# Patient Record
Sex: Female | Born: 1965 | Race: Black or African American | Hispanic: No | Smoking: Never smoker
Health system: Southern US, Community
[De-identification: ages and names within clinical notes are randomized; demographics above are authoritative.]

## PROBLEM LIST (undated history)

## (undated) DIAGNOSIS — N200 Calculus of kidney: Secondary | ICD-10-CM

## (undated) DIAGNOSIS — I509 Heart failure, unspecified: Secondary | ICD-10-CM

## (undated) DIAGNOSIS — I10 Essential (primary) hypertension: Secondary | ICD-10-CM

## (undated) DIAGNOSIS — E119 Type 2 diabetes mellitus without complications: Secondary | ICD-10-CM

## (undated) DIAGNOSIS — Z8619 Personal history of other infectious and parasitic diseases: Secondary | ICD-10-CM

## (undated) HISTORY — DX: Personal history of other infectious and parasitic diseases: Z86.19

## (undated) HISTORY — DX: Calculus of kidney: N20.0

## (undated) HISTORY — PX: ANKLE SURGERY: SHX546

## (undated) NOTE — *Deleted (*Deleted)
***In Progress*** Primary Care Provider: Ardith Dark, MD [facility] Cardiologist: Reatha Harps, MD [facility] Electrophysiologist:  Lanier Prude, MD  Advanced HF Cardiologist: Dr. Gala Romney  HPI:  Savannah Morales is a 17 y.o. female with a hx of DM, HTN, and new onset systolic HF.  On 12/24/19, she was admitted with worsening SOB and cough. She said she was doing fine until she got the COVID vaccine Proofreader) in May 2021. Echo showed EF 35%. She was diuresed and GDMT was started. No CP or coronary calcium, so cath was not felt to be warranted.  cMRI on 9/21 with severely dilated LV, EF 21% with normal wall thickness, diffuse hypokinesis. RVEF 26%. Severe left atrial enlargement, moderate right atrial enlargement. Moderate to severe central mitral regurgitation with regurgitant fraction 48%. No LGE.  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 Lalla Brothers   On 02/22/20, she was seen by Dr. Gala Romney for initial HF visit. She was able to walk on flat ground without issues but got SOB with too much exertion or while going up a hill. She took furosemide every other day and noted getting more SOB with cough and orthopnea in between doses. She had mild LEE. At this visit, spironolactone and digoxin started, metoprolol succinate decreased to 50 mg daily, and lisinopril switched to Entresto 24/26 mg BID.  R/LHC on 02/27/20 showed severe NICM with EF 20-25%, low filling pressures with normal cardiac output. Normal coronaries. (RA 2, PA 21, PCW 5, CI 3.1 fick, thermo CI 2.6, PVR 2.6, SVR 952)  Today she returns to HF clinic for pharmacist medication titration. At last visit with MD ***.   Low BP (116/74 w/ DB, 90/60 w/ Lalla Brothers), HR 110 w/ DB, 68 w/ lambert), 83.2 kg ***Check labs: Dig level, BMET for spiro (haven't check yet, only iStat) Plan A: Increase Spiro 25 Plan B: SGLT2, increase BB if euvolemic since CI was ok on cath, HR 68 last time Plan  C: Increase Entresto Plan D: Hydral/nitrate F/U: *** Pharm, 05/01/20 DB   Overall feeling ***. Dizziness, lightheadedness, fatigue:  Chest pain or palpitations:  How is your breathing?: *** SOB: Able to complete all ADLs. Activity level ***  Weight at home pounds. Takes furosemide/torsemide/bumex *** mg *** daily.  LEE PND/Orthopnea  Appetite *** Low-salt diet:   Physical Exam Cost/affordability of meds   HF Medications: Metoprolol succinate 50 mg daily Entresto 24/26 mg BID Spironolactone 12.5 mg daily Digoxin 0.125 mg daily Furosemide 40 mg QOD or daily PRN Potassium Chloride 20 mEq daily (or daily PRN with furosemide)  Has the patient been experiencing any side effects to the medications prescribed?  {YES NO:22349}  Does the patient have any problems obtaining medications due to transportation or finances?   {YES NO:22349}  Understanding of regimen: {excellent/good/fair/poor:19665} Understanding of indications: {excellent/good/fair/poor:19665} Potential of compliance: {excellent/good/fair/poor:19665} Patient understands to avoid NSAIDs. Patient understands to avoid decongestants.    Pertinent Lab Values: . Serum creatinine ***, BUN ***, Potassium ***, Sodium ***, BNP ***, Magnesium ***, Digoxin ***   Vital Signs: . Weight: *** (last clinic weight: ***) . Blood pressure: ***  . Heart rate: ***   Assessment: 1. Acute combined systolic and diastolic CHF: - Diagnosed 8/21 EF 30-35% - cMRI 9/21 LVEF 21% RV EF 26% mod-sev MR. 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 11/8 Zio patch results with frequent PVC burden 13%,  8 episodes of NSVT, longest lasting 12.7 seconds w/ rate 128 bpm. F/u Zio XT monitor pending. - NYHA IIIb class symptoms. She is volume overloaded. *** - Continue furosemide 40 mg PRN - Continue potassium chloride 20 mEq PRN - Continue metoprolol succinate 50 mg daily - Continue Entresto 24/26 mg BID  - Continue spironolactone 12.5 mg daily - Continue digoxin 0.125 mg daily  - As above, PVC cardiomyopathy seems to be the smoking gun here but no improvement since ablation. Zio patch results pending to requantify PVC burden - Will plan R/L cath to further evaluate early next week. Suspect likelihood of severe CAD is low but at this point I think we need to be 100% sure and also need to know hemodynamic data. - Will see back in clinic q2 weeks for aggressive med titration   2.  Frequent PVCs: -  s/p PVC ablation 01/31/20 with Dr. Lalla Brothers - has f/u Zio patch pending to re-quantify - needs sleep study at some point.    Plan: 1) Medication changes: Based on clinical presentation, vital signs and recent labs will *** 2) Labs: *** 3) Follow-up: ***   Karle Plumber, PharmD, BCPS, BCCP, CPP Heart Failure Clinic Pharmacist [phone]

---

## 2016-03-17 ENCOUNTER — Other Ambulatory Visit: Payer: 59

## 2016-03-17 ENCOUNTER — Encounter: Payer: Self-pay | Admitting: Family

## 2016-03-17 ENCOUNTER — Ambulatory Visit (INDEPENDENT_AMBULATORY_CARE_PROVIDER_SITE_OTHER): Payer: 59 | Admitting: Family

## 2016-03-17 VITALS — BP 148/100 | HR 106 | Temp 98.8°F | Resp 16 | Ht 79.0 in | Wt 205.0 lb

## 2016-03-17 DIAGNOSIS — I152 Hypertension secondary to endocrine disorders: Secondary | ICD-10-CM | POA: Insufficient documentation

## 2016-03-17 DIAGNOSIS — Z87442 Personal history of urinary calculi: Secondary | ICD-10-CM | POA: Insufficient documentation

## 2016-03-17 DIAGNOSIS — R8281 Pyuria: Secondary | ICD-10-CM

## 2016-03-17 DIAGNOSIS — I10 Essential (primary) hypertension: Secondary | ICD-10-CM | POA: Diagnosis not present

## 2016-03-17 DIAGNOSIS — N390 Urinary tract infection, site not specified: Secondary | ICD-10-CM | POA: Diagnosis not present

## 2016-03-17 DIAGNOSIS — E1159 Type 2 diabetes mellitus with other circulatory complications: Secondary | ICD-10-CM | POA: Insufficient documentation

## 2016-03-17 LAB — POCT URINALYSIS DIPSTICK
Glucose, UA: NEGATIVE
Nitrite, UA: NEGATIVE
Spec Grav, UA: >=1.03
Urobilinogen, UA: NEGATIVE
pH, UA: 6

## 2016-03-17 MED ORDER — AMLODIPINE BESYLATE 10 MG PO TABS: 10.0000 mg | ORAL_TABLET | Freq: Every day | ORAL | 1 refills | Status: DC

## 2016-03-17 NOTE — Assessment & Plan Note (Signed)
Presents with history of urinary stones and new onset flank pain without other urinary symptoms. In office urinalysis positive for leukocytes and hematuria negative for nitrites. Urine sent for culture for confirmation. Cannot rule out asymptomatic bacteriuria. Continue to hydrate and follow-up pending urine culture.

## 2016-03-17 NOTE — Assessment & Plan Note (Signed)
Blood pressure remains elevated above goal 140/90 on lifestyle management and previously maintained on unknown medication. Denies worse headache of life, changes in vision, or new symptoms of end organ damage. Start amlodipine. Encouraged to monitor blood pressure at home and follow low-sodium diet. Follow-up in one month or sooner if needed.

## 2016-03-17 NOTE — Patient Instructions (Addendum)
Thank you for choosing [facility].  SUMMARY AND INSTRUCTIONS:  Drink plenty of water.  We will be in touch with the your urine culture results.    Follow up:  If your symptoms worsen or fail to improve, please contact our office for further instruction, or in case of emergency go directly to the emergency room at the closest medical facility.    DASH Eating Plan DASH stands for "Dietary Approaches to Stop Hypertension." The DASH eating plan is a healthy eating plan that has been shown to reduce high blood pressure (hypertension). Additional health benefits may include reducing the risk of type 2 diabetes mellitus, heart disease, and stroke. The DASH eating plan may also help with weight loss. What do I need to know about the DASH eating plan? For the DASH eating plan, you will follow these general guidelines:  Choose foods with less than 150 milligrams of sodium per serving (as listed on the food label).  Use salt-free seasonings or herbs instead of table salt or sea salt.  Check with your health care provider or pharmacist before using salt substitutes.  Eat lower-sodium products. These are often labeled as "low-sodium" or "no salt added."  Eat fresh foods. Avoid eating a lot of canned foods.  Eat more vegetables, fruits, and low-fat dairy products.  Choose whole grains. Look for the word "whole" as the first word in the ingredient list.  Choose fish and skinless chicken or [place] more often than red meat. Limit fish, poultry, and meat to 6 oz (170 g) each day.  Limit sweets, desserts, sugars, and sugary drinks.  Choose heart-healthy fats.  Eat more home-cooked food and less restaurant, buffet, and fast food.  Limit fried foods.  Do not fry foods. Cook foods using methods such as baking, boiling, grilling, and broiling instead.  When eating at a restaurant, ask that your food be prepared with less salt, or no salt if possible. What foods can I eat? Seek help  from a dietitian for individual calorie needs. Grains  Whole grain or whole wheat bread. Brown rice. Whole grain or whole wheat pasta. Quinoa, bulgur, and whole grain cereals. Low-sodium cereals. Corn or whole wheat flour tortillas. Whole grain cornbread. Whole grain crackers. Low-sodium crackers. Vegetables  Fresh or frozen vegetables (raw, steamed, roasted, or grilled). Low-sodium or reduced-sodium tomato and vegetable juices. Low-sodium or reduced-sodium tomato sauce and paste. Low-sodium or reduced-sodium canned vegetables. Fruits  All fresh, canned (in natural juice), or frozen fruits. Meat and Other Protein Products  Ground beef (85% or leaner), grass-fed beef, or beef trimmed of fat. Skinless chicken or [place]. Ground chicken or [place]. Pork trimmed of fat. All fish and seafood. Eggs. Dried beans, peas, or lentils. Unsalted nuts and seeds. Unsalted canned beans. Dairy  Low-fat dairy products, such as skim or 1% milk, 2% or reduced-fat cheeses, low-fat ricotta or cottage cheese, or plain low-fat yogurt. Low-sodium or reduced-sodium cheeses. Fats and Oils  Tub margarines without trans fats. Light or reduced-fat mayonnaise and salad dressings (reduced sodium). Avocado. Safflower, olive, or canola oils. Natural peanut or almond butter. Other  Unsalted popcorn and pretzels. The items listed above may not be a complete list of recommended foods or beverages. Contact your dietitian for more options.  What foods are not recommended? Grains  White bread. White pasta. White rice. Refined cornbread. Bagels and croissants. Crackers that contain trans fat. Vegetables  Creamed or fried vegetables. Vegetables in a cheese sauce. Regular canned vegetables. Regular canned tomato sauce and paste.  Regular tomato and vegetable juices. Fruits  Canned fruit in light or heavy syrup. Fruit juice. Meat and Other Protein Products  Fatty cuts of meat. Ribs, chicken wings, bacon, sausage, bologna, salami,  chitterlings, fatback, hot dogs, bratwurst, and packaged luncheon meats. Salted nuts and seeds. Canned beans with salt. Dairy  Whole or 2% milk, cream, half-and-half, and cream cheese. Whole-fat or sweetened yogurt. Full-fat cheeses or blue cheese. Nondairy creamers and whipped toppings. Processed cheese, cheese spreads, or cheese curds. Condiments  Onion and garlic salt, seasoned salt, table salt, and sea salt. Canned and packaged gravies. Worcestershire sauce. Tartar sauce. Barbecue sauce. Teriyaki sauce. Soy sauce, including reduced sodium. Steak sauce. Fish sauce. Oyster sauce. Cocktail sauce. Horseradish. Ketchup and mustard. Meat flavorings and tenderizers. Bouillon cubes. Hot sauce. Tabasco sauce. Marinades. Taco seasonings. Relishes. Fats and Oils  Butter, stick margarine, lard, shortening, ghee, and bacon fat. Coconut, palm kernel, or palm oils. Regular salad dressings. Other  Pickles and olives. Salted popcorn and pretzels. The items listed above may not be a complete list of foods and beverages to avoid. Contact your dietitian for more information.  Where can I find more information? National Heart, Lung, and Blood Institute: CablePromo.itwww.nhlbi.nih.gov/health/health-topics/topics/dash/ This information is not intended to replace advice given to you by your health care provider. Make sure you discuss any questions you have with your health care provider. Document Released: 04/02/2011 Document Revised: 09/19/2015 Document Reviewed: 02/15/2013 Elsevier Interactive Patient Education  2017 [employer]

## 2016-03-17 NOTE — Progress Notes (Signed)
Subjective:    Patient ID: Savannah Morales, female    DOB: 1966/03/17, 50 y.o.   MRN: [id number]  Chief Complaint  Patient presents with  . Establish Care    states that she was having low back pain and stopped drinking soda and started drinking more water and it helped ease up the pain, has had kidney stones in the past    HPI:  Savannah Morales is a 50 y.o. female who  has a past medical history of History of chicken pox and Kidney stones. and presents today for an office visit to establish care.   1.) Low back pain - This is a new problem. Associated symptom of pain in the right flank has been going on for a couple of weeks. Denies fevers. Denies any dysuria, urgency, or freqeuncy. Noes that she recently stopped drinking soda and increased her water intake which helped to ease up the pain. She has had kidney stones in the past. Continues to experience occasional discomfort in the right flank.   2.) Hypertension - Appears a diagnosed with hypertension not currently maintained on medications. Denies worse headache of life or symptoms of end organ damage. Does not currently check blood pressures at home.. Previously maintained on medication which she does not recall.  BP Readings from Last 3 Encounters:  03/17/16 (!) 148/100    No Known Allergies    No outpatient prescriptions prior to visit.   No facility-administered medications prior to visit.      Past Medical History:  Diagnosis Date  . History of chicken pox   . Kidney stones       Past Surgical History:  Procedure Laterality Date  . ANKLE SURGERY        Family History  Problem Relation Age of Onset  . Hyperlipidemia Mother   . Hypertension Father   . Diabetes Father   . Cancer Paternal Grandmother       Social History   Social History  . Marital status: Divorced    Spouse name: N/A  . Number of children: 3  . Years of education: 14   Occupational History  . Member Service Representative      Social History Main Topics  . Smoking status: Never Smoker  . Smokeless tobacco: Never Used  . Alcohol use Yes     Comment: Rarely  . Drug use: No  . Sexual activity: Not on file   Other Topics Concern  . Not on file   Social History Narrative  . No narrative on file      Review of Systems  Constitutional: Negative for chills and fever.  Eyes:       Negative for changes in vision  Respiratory: Negative for cough, chest tightness and wheezing.   Cardiovascular: Negative for chest pain, palpitations and leg swelling.  Genitourinary: Positive for flank pain. Negative for decreased urine volume, dysuria, frequency, hematuria and urgency.  Neurological: Negative for dizziness, weakness and light-headedness.       Objective:    BP (!) 148/100 (BP Location: Left Arm, Patient Position: Sitting, Cuff Size: Large)   Pulse (!) 106   Temp 98.8 F (37.1 C) (Oral)   Resp 16   Ht 6\' 7"  (2.007 m)   Wt 205 lb (93 kg)   SpO2 98%   BMI 23.09 kg/m  Nursing note and vital signs reviewed.  Physical Exam  Constitutional: She is oriented to person, place, and time. She appears well-developed and well-nourished. No distress.  Eyes:  Conjunctivae and EOM are normal. Pupils are equal, round, and reactive to light.  Cardiovascular: Normal rate, regular rhythm and intact distal pulses.  Exam reveals no gallop and no friction rub.   No murmur heard. Pulmonary/Chest: Effort normal and breath sounds normal.  Abdominal: There is no CVA tenderness.  Neurological: She is alert and oriented to person, place, and time.  Skin: Skin is warm and dry.  Psychiatric: She has a normal mood and affect. Her behavior is normal. Judgment and thought content normal.       Assessment & Plan:   Problem List Items Addressed This Visit      Cardiovascular and Mediastinum   Essential hypertension - Primary    Blood pressure remains elevated above goal 140/90 on lifestyle management and previously  maintained on unknown medication. Denies worse headache of life, changes in vision, or new symptoms of end organ damage. Start amlodipine. Encouraged to monitor blood pressure at home and follow low-sodium diet. Follow-up in one month or sooner if needed.      Relevant Medications   amLODipine (NORVASC) 10 MG tablet     Other   H/O urinary stone    Presents with history of urinary stones and new onset flank pain without other urinary symptoms. In office urinalysis positive for leukocytes and hematuria negative for nitrites. Urine sent for culture for confirmation. Cannot rule out asymptomatic bacteriuria. Continue to hydrate and follow-up pending urine culture.      Relevant Orders   POCT urinalysis dipstick (Completed)    Other Visit Diagnoses    Pyuria       Relevant Orders   Urine culture       I am having Ms. Jerde start on amLODipine. I am also having her maintain her Garlic, vitamin B-12, Fish Oil, and Turmeric.   Meds ordered this encounter  Medications  . Garlic 1000 MG CAPS    Sig: Take by mouth.  . vitamin B-12 (CYANOCOBALAMIN) 1000 MCG tablet    Sig: Take 1,000 mcg by mouth daily.  . Omega-3 Fatty Acids (FISH OIL) 1200 MG CPDR    Sig: Take by mouth.  . Turmeric 500 MG CAPS    Sig: Take by mouth.  . amLODipine (NORVASC) [name]10 MG tablet    Sig: Take 1 tablet (10 mg total) by mouth daily.    Dispense:  30 tablet    Refill:  1    Order Specific Question:   Supervising Provider    Answer:   Hillard DankerRAWFORD, ELIZABETH A [[id number]]     Follow-up: Return if symptoms worsen or fail to improve.  Jeanine Luzalone, Shaqueena Mauceri, FNP

## 2016-03-18 LAB — URINE CULTURE

## 2016-03-21 ENCOUNTER — Encounter: Payer: Self-pay | Admitting: Family

## 2016-04-06 ENCOUNTER — Emergency Department (HOSPITAL_COMMUNITY)
Admission: EM | Admit: 2016-04-06 | Discharge: 2016-04-06 | Disposition: A | Discharge status: Home / Self Care | Payer: 59 | Attending: Emergency Medicine | Admitting: Emergency Medicine

## 2016-04-06 ENCOUNTER — Encounter (HOSPITAL_COMMUNITY): Payer: Self-pay | Admitting: Emergency Medicine

## 2016-04-06 DIAGNOSIS — X58XXXA Exposure to other specified factors, initial encounter: Secondary | ICD-10-CM | POA: Insufficient documentation

## 2016-04-06 DIAGNOSIS — I10 Essential (primary) hypertension: Secondary | ICD-10-CM | POA: Diagnosis not present

## 2016-04-06 DIAGNOSIS — Y929 Unspecified place or not applicable: Secondary | ICD-10-CM | POA: Diagnosis not present

## 2016-04-06 DIAGNOSIS — Y939 Activity, unspecified: Secondary | ICD-10-CM | POA: Diagnosis not present

## 2016-04-06 DIAGNOSIS — Y999 Unspecified external cause status: Secondary | ICD-10-CM | POA: Diagnosis not present

## 2016-04-06 DIAGNOSIS — S3992XA Unspecified injury of lower back, initial encounter: Secondary | ICD-10-CM | POA: Diagnosis present

## 2016-04-06 DIAGNOSIS — S39012A Strain of muscle, fascia and tendon of lower back, initial encounter: Secondary | ICD-10-CM

## 2016-04-06 HISTORY — DX: Essential (primary) hypertension: I10

## 2016-04-06 MED ORDER — DICLOFENAC SODIUM 50 MG PO TBEC: 50.0000 mg | DELAYED_RELEASE_TABLET | Freq: Two times a day (BID) | ORAL | 0 refills | Status: DC

## 2016-04-06 MED ORDER — CYCLOBENZAPRINE HCL 10 MG PO TABS: 10.0000 mg | ORAL_TABLET | Freq: Two times a day (BID) | ORAL | 0 refills | Status: DC | PRN

## 2016-04-06 NOTE — Discharge Instructions (Signed)
Do not drive while taking the muscle relaxant as it will make you sleepy. °

## 2016-04-06 NOTE — ED Triage Notes (Signed)
Pt. Stated, I've taken some Tylenol and Ibuprofen and it hasnt done anything.  Pt. denies any injury

## 2016-04-06 NOTE — ED Provider Notes (Signed)
[facility]-EMERGENCY DEPT Provider Note   CSN: [id number] Arrival date & time: 04/06/16  1814 By signing my name below, I, Savannah Morales, attest that this documentation has been prepared under the direction and in the presence of [facility] LLCope Morales, [place]. Electronically Signed: Bridgette HabermannMaria Morales, ED Scribe. 04/06/16. 7:50 PM.  History   Chief Complaint Chief Complaint  Patient presents with  . Back Pain   HPI Comments: Savannah Morales is a 50 y.o. female with h/o HTN who presents to the Emergency Department complaining of lower back pain onset two weeks ago. She denies any recent injury or trauma. Her pain is exacerbated with standing and ambulating, and improved when sitting still. She has taken Tylenol and Ibuprofen with no relief to her pain. Pt denies urinary frequency, urgency, dysuria, fever, or any other associated symptoms.   The history is provided by the patient. No language interpreter was used.    Past Medical History:  Diagnosis Date  . History of chicken pox   . Hypertension   . Kidney stones     Patient Active Problem List   Diagnosis Date Noted  . Essential hypertension 03/17/2016  . H/O urinary stone 03/17/2016    Past Surgical History:  Procedure Laterality Date  . ANKLE SURGERY      OB History    No data available       Home Medications    Prior to Admission medications   Medication Sig Start Date End Date Taking? Authorizing Provider  amLODipine (NORVASC) 10 MG tablet Take 1 tablet (10 mg total) by mouth daily. 03/17/16   Veryl SpeakGregory D Calone, FNP  cyclobenzaprine (FLEXERIL) 10 MG tablet Take 1 tablet (10 mg total) by mouth 2 (two) times daily as needed for muscle spasms. 04/06/16   Savannah Orlene OchM Neese, NP  diclofenac (VOLTAREN) 50 MG EC tablet Take 1 tablet (50 mg total) by mouth 2 (two) times daily. 04/06/16   Savannah Orlene OchM Neese, NP  Garlic 1000 MG CAPS Take by mouth.    Historical Provider, MD  Omega-3 Fatty Acids (FISH OIL) 1200 MG CPDR Take by mouth.    Historical Provider, MD    Turmeric 500 MG CAPS Take by mouth.    Historical Provider, MD  vitamin B-12 (CYANOCOBALAMIN) 1000 MCG tablet Take 1,000 mcg by mouth daily.    Historical Provider, MD    Family History Family History  Problem Relation Age of Onset  . Hyperlipidemia Mother   . Hypertension Father   . Diabetes Father   . Cancer Paternal Grandmother     Social History Social History  Substance Use Topics  . Smoking status: Never Smoker  . Smokeless tobacco: Never Used  . Alcohol use Yes     Comment: Rarely     Allergies   Patient has no known allergies.   Review of Systems Review of Systems  Constitutional: Negative for chills and fever.  Genitourinary: Negative for dysuria, frequency and urgency.  Musculoskeletal: Positive for back pain.  All other systems reviewed and are negative.    Physical Exam Updated Vital Signs BP 147/98 (BP Location: Right Arm)   Pulse 109   Temp 98.6 F (37 C) (Oral)   Resp 16   Ht 5\' 8"  (1.727 m)   Wt 94 kg   LMP 03/26/2016   SpO2 100%   BMI 31.52 kg/m   Physical Exam  Constitutional: She is oriented to person, place, and time. She appears well-developed and well-nourished. No distress.  HENT:  Head: Normocephalic.  Eyes: Conjunctivae  and EOM are normal.  Neck: Neck supple.  Cardiovascular: Normal rate and regular rhythm.   Pulses:      Radial pulses are 2+ on the right side, and 2+ on the left side.  Pulmonary/Chest: Effort normal and breath sounds normal.  Abdominal: Soft. There is no tenderness. There is no CVA tenderness.  Musculoskeletal: She exhibits tenderness.       Lumbar back: She exhibits tenderness and spasm. Decreased range of motion: due to pain.  Grips are equal, adequate circulation. Tenderness to palpation to lower lumbar area, increased pain with ROM. Reflexes are symmetric and normal. Steady gait with no foot drag.  Neurological: She is alert and oriented to person, place, and time.  Skin: Skin is warm and dry.   Psychiatric: She has a normal mood and affect. Her behavior is normal.  Nursing note and vitals reviewed.    ED Treatments / Results  DIAGNOSTIC STUDIES: Oxygen Saturation is 100% on RA, normal by my interpretation.    COORDINATION OF CARE: 7:49 PM Discussed treatment plan with pt at bedside which includes muscle relaxants and pt agreed to plan.  Labs (all labs ordered are listed, but only abnormal results are displayed) Labs Reviewed - No data to display  Radiology No results found.  Procedures Procedures (including critical care time)  Medications Ordered in ED Medications - No data to display   Initial Impression / Assessment and Plan / ED Course  I have reviewed the triage vital signs and the nursing notes.  Clinical Course     Final Clinical Impressions(s) / ED Diagnoses   Final diagnoses:  Lumbosacral strain, initial encounter   Patient with back pain. No neurological deficits and normal neuro exam. Patient is ambulatory. No loss of bowel or bladder control.  No concern for cauda equina. No fever, night sweats, weight loss, h/o cancer, IVDA, no recent procedure to back. No urinary symptoms suggestive of UTI. Supportive care and return precaution discussed. Appears safe for discharge at this time. Follow up as indicated in discharge paperwork.   New Prescriptions Discharge Medication List as of 04/06/2016  7:53 PM    START taking these medications   Details  cyclobenzaprine (FLEXERIL) 10 MG tablet Take 1 tablet (10 mg total) by mouth 2 (two) times daily as needed for muscle spasms., Starting Mon 04/06/2016, Print    diclofenac (VOLTAREN) 50 MG EC tablet Take 1 tablet (50 mg total) by mouth 2 (two) times daily., Starting Mon 04/06/2016, Print       I personally performed the services described in this documentation, which was scribed in my presence. The recorded information has been reviewed and is accurate.     [place], [place] 04/06/16 2052    Loren Racer, MD 04/11/16 [phone]

## 2016-04-06 NOTE — ED Triage Notes (Signed)
Pt. Stated, Im having lower back pain for 2 weeks,

## 2016-04-12 ENCOUNTER — Encounter: Payer: Self-pay | Admitting: Family

## 2016-04-13 ENCOUNTER — Encounter: Payer: Self-pay | Admitting: Family

## 2016-04-13 ENCOUNTER — Other Ambulatory Visit (INDEPENDENT_AMBULATORY_CARE_PROVIDER_SITE_OTHER): Payer: 59

## 2016-04-13 ENCOUNTER — Ambulatory Visit (INDEPENDENT_AMBULATORY_CARE_PROVIDER_SITE_OTHER): Payer: 59 | Admitting: Family

## 2016-04-13 VITALS — BP 132/86 | HR 75 | Temp 98.2°F | Resp 16 | Ht 68.0 in | Wt 204.6 lb

## 2016-04-13 DIAGNOSIS — Z0000 Encounter for general adult medical examination without abnormal findings: Secondary | ICD-10-CM

## 2016-04-13 DIAGNOSIS — I10 Essential (primary) hypertension: Secondary | ICD-10-CM

## 2016-04-13 DIAGNOSIS — Z23 Encounter for immunization: Secondary | ICD-10-CM

## 2016-04-13 DIAGNOSIS — Z0189 Encounter for other specified special examinations: Secondary | ICD-10-CM

## 2016-04-13 LAB — COMPREHENSIVE METABOLIC PANEL
ALT: 12 U/L (ref 0–35)
AST: 12 U/L (ref 0–37)
Albumin: 4.4 g/dL (ref 3.5–5.2)
Alkaline Phosphatase: 55 U/L (ref 39–117)
BUN: 10 mg/dL (ref 6–23)
CO2: 24 mEq/L (ref 19–32)
Calcium: 9.8 mg/dL (ref 8.4–10.5)
Chloride: 105 mEq/L (ref 96–112)
Creatinine, Ser: 0.71 mg/dL (ref 0.40–1.20)
GFR: 112.77 mL/min (ref >60.00)
Glucose, Bld: 105 mg/dL — ABNORMAL HIGH (ref 70–99)
Potassium: 4.2 mEq/L (ref 3.5–5.1)
Sodium: 140 mEq/L (ref 135–145)
Total Bilirubin: 0.4 mg/dL (ref 0.2–1.2)
Total Protein: 7.9 g/dL (ref 6.0–8.3)

## 2016-04-13 LAB — LIPID PANEL
Cholesterol: 142 mg/dL (ref 0–200)
HDL: 56.2 mg/dL (ref >39.00)
LDL Cholesterol: 69 mg/dL (ref 0–99)
NonHDL: 85.53
Total CHOL/HDL Ratio: 3
Triglycerides: 83 mg/dL (ref 0.0–149.0)
VLDL: 16.6 mg/dL (ref 0.0–40.0)

## 2016-04-13 LAB — CBC
HCT: 40.3 % (ref 36.0–46.0)
Hemoglobin: 13.4 g/dL (ref 12.0–15.0)
MCHC: 33.3 g/dL (ref 30.0–36.0)
MCV: 80.5 fl (ref 78.0–100.0)
Platelets: 293 10*3/uL (ref 150.0–400.0)
RBC: 5.01 Mil/uL (ref 3.87–5.11)
RDW: 13.9 % (ref 11.5–15.5)
WBC: 6.2 10*3/uL (ref 4.0–10.5)

## 2016-04-13 LAB — HEMOGLOBIN A1C: Hgb A1c MFr Bld: 6.9 % — ABNORMAL HIGH (ref 4.6–6.5)

## 2016-04-13 LAB — TSH: TSH: 0.51 u[IU]/mL (ref 0.35–4.50)

## 2016-04-13 MED ORDER — AMLODIPINE BESYLATE 10 MG PO TABS: 10.0000 mg | ORAL_TABLET | Freq: Every day | ORAL | 1 refills | Status: DC

## 2016-04-13 NOTE — Patient Instructions (Signed)
Thank you for choosing [facility].  SUMMARY AND INSTRUCTIONS:  Medication:  Please continue to take your medications as prescribed.   Your prescription(s) have been submitted to your pharmacy or been printed and provided for you. Please take as directed and contact our office if you believe you are having problem(s) with the medication(s) or have any questions.  Labs:  Please stop by the lab on the lower level of the building for your blood work. Your results will be released to MyChart (or called to you) after review, usually within 72 hours after test completion. If any changes need to be made, you will be notified at that same time.  1.) The lab is open from 7:30am to 5:30 pm Monday-Friday 2.) No appointment is necessary 3.) Fasting (if needed) is 6-8 hours after food and drink; black coffee and water are okay   Imaging / Radiology:  Please stop by radiology on the basement level of the building for your x-rays. Your results will be released to MyChart (or called to you) after review, usually within 72 hours after test completion. If any treatments or changes are necessary, you will be notified at that same time.  Referrals:  Referrals have been made during this visit. You should expect to hear back from our schedulers in about 7-10 days in regards to establishing an appointment with the specialists we discussed.   Follow up:  If your symptoms worsen or fail to improve, please contact our office for further instruction, or in case of emergency go directly to the emergency room at the closest medical facility.     

## 2016-04-13 NOTE — Progress Notes (Signed)
Subjective:    Patient ID: Savannah Morales, female    DOB: 1965-09-02, 50 y.o.   MRN: [id number]  Chief Complaint  Patient presents with  . Follow-up    wants to do basic blood work today    HPI:  Savannah Morales is a 50 y.o. female who  has a past medical history of History of chicken pox; Hypertension; and Kidney stones. and presents today for a follow up office visit.  1.)  Hypertension - Currently maintained on amlodipine. Reports taking the medication as prescribed and denies adverse side effects. Does not currently check her blood pressure at home. Working on consuming a low sodium. Denies worst headache of life or new symptoms of end organ damage.   BP Readings from Last 3 Encounters:  04/13/16 132/86  04/06/16 147/98  03/17/16 (!) 148/100   2.) Routine lab work - Patient is overdue for routine lab work and is requesting completion of lab work today. She would also like an A1c added as she has significant family history of diabetes.   No Known Allergies    Outpatient Medications Prior to Visit  Medication Sig Dispense Refill  . cyclobenzaprine (FLEXERIL) 10 MG tablet Take 1 tablet (10 mg total) by mouth 2 (two) times daily as needed for muscle spasms. 20 tablet 0  . diclofenac (VOLTAREN) 50 MG EC tablet Take 1 tablet (50 mg total) by mouth 2 (two) times daily. 15 tablet 0  . Garlic 1000 MG CAPS Take by mouth.    . Omega-3 Fatty Acids (FISH OIL) 1200 MG CPDR Take by mouth.    . Turmeric 500 MG CAPS Take by mouth.    . vitamin B-12 (CYANOCOBALAMIN) 1000 MCG tablet Take 1,000 mcg by mouth daily.    [name]. amLODipine (NORVASC) 10 MG tablet Take 1 tablet (10 mg total) by mouth daily. 30 tablet 1   No facility-administered medications prior to visit.     Review of Systems  Constitutional: Negative for chills and fever.  Eyes:       Negative for changes in vision  Respiratory: Negative for cough, chest tightness and wheezing.   Cardiovascular: Negative for chest pain,  palpitations and leg swelling.  Neurological: Negative for dizziness, weakness and light-headedness.      Objective:    BP 132/86 (BP Location: Left Arm, Patient Position: Sitting, Cuff Size: Large)   Pulse 75   Temp 98.2 F (36.8 C) (Oral)   Resp 16   Ht 5\' 8"  (1.727 m)   Wt 204 lb 9.6 oz (92.8 kg)   LMP 03/26/2016   SpO2 95%   BMI 31.11 kg/m  Nursing note and vital signs reviewed.  Physical Exam  Constitutional: She is oriented to person, place, and time. She appears well-developed and well-nourished. No distress.  Cardiovascular: Normal rate, regular rhythm, normal heart sounds and intact distal pulses.   Pulmonary/Chest: Effort normal and breath sounds normal.  Neurological: She is alert and oriented to person, place, and time.  Skin: Skin is warm and dry.  Psychiatric: She has a normal mood and affect. Her behavior is normal. Judgment and thought content normal.       Assessment & Plan:   Problem List Items Addressed This Visit      Cardiovascular and Mediastinum   Essential hypertension - Primary    Hypertension remains well-controlled and below goal 140/90 with current regimen and no adverse side effects. Does not currently check blood pressure at home. Encouraged continue to follow low-sodium diet. Denies  worse headache of life and no symptoms of end organ damage noted on physical exam. Continue current dosage of amlodipine. Continue to monitor.      Relevant Medications   amLODipine (NORVASC) 10 MG tablet     Other   Encounter for routine laboratory testing   Relevant Orders   CBC (Completed)   Comprehensive metabolic panel (Completed)   Lipid panel (Completed)   TSH (Completed)   Hemoglobin A1c (Completed)    Other Visit Diagnoses    Need for diphtheria-tetanus-pertussis (Tdap) vaccine, adult/adolescent       Relevant Orders   Tdap vaccine greater than or equal to 7yo IM (Completed)   Encounter for immunization       Relevant Medications   amLODipine  (NORVASC) 10 MG tablet   Other Relevant Orders   Tdap vaccine greater than or equal to 7yo IM (Completed)   Flu Vaccine QUAD 36+ mos IM (Completed)   CBC (Completed)   Comprehensive metabolic panel (Completed)   Lipid panel (Completed)   TSH (Completed)   Hemoglobin A1c (Completed)   Encounter for immunization       Relevant Orders   Flu Vaccine QUAD 36+ mos IM (Completed)       I am having Ms. Monjaraz maintain her Garlic, vitamin B-12, Fish Oil, Turmeric, diclofenac, cyclobenzaprine, and amLODipine.   Meds ordered this encounter  Medications  . amLODipine (NORVASC) 10 MG tablet    Sig: Take 1 tablet (10 mg total) by mouth daily.    Dispense:  90 tablet    Refill:  1    Order Specific Question:   Supervising Provider    Answer:   Hillard Danker A [[id number]]     Follow-up: Return in about 3 months (around 07/12/2016), or if symptoms worsen or fail to improve.  Jeanine Luz, FNP

## 2016-04-13 NOTE — Assessment & Plan Note (Signed)
Hypertension remains well-controlled and below goal 140/90 with current regimen and no adverse side effects. Does not currently check blood pressure at home. Encouraged continue to follow low-sodium diet. Denies worse headache of life and no symptoms of end organ damage noted on physical exam. Continue current dosage of amlodipine. Continue to monitor.

## 2016-04-16 ENCOUNTER — Ambulatory Visit: Payer: 59 | Admitting: Family

## 2016-04-21 ENCOUNTER — Ambulatory Visit (INDEPENDENT_AMBULATORY_CARE_PROVIDER_SITE_OTHER): Payer: 59 | Admitting: Family

## 2016-04-21 ENCOUNTER — Encounter: Payer: Self-pay | Admitting: Family

## 2016-04-21 DIAGNOSIS — E119 Type 2 diabetes mellitus without complications: Secondary | ICD-10-CM | POA: Diagnosis not present

## 2016-04-21 MED ORDER — METFORMIN HCL ER 500 MG PO TB24: 500.0000 mg | ORAL_TABLET | Freq: Every day | ORAL | 2 refills | Status: DC

## 2016-04-21 NOTE — Progress Notes (Signed)
Subjective:    Patient ID: Savannah Morales, female    DOB: May 19, 1965, 50 y.o.   MRN: [id number]  Chief Complaint  Patient presents with  . Follow-up    talk about medication for diabetes    HPI:  Savannah Morales is a 50 y.o. female who  has a past medical history of History of chicken pox; Hypertension; and Kidney stones. and presents today for an office visit.   Type 2 diabetes - Recently completed an annual physical exam and noted to have a hemoglobin A1c of 6.9 diagnosed in her with type 2 diabetes. Not currently maintained on medication. Does not currently check her blood sugars at home. Denies any symptoms of end organ damage or changes to vision. No numbness or tingling. She is here to start her diabetic prevention and start medication.  Lab Results  Component Value Date   HGBA1C 6.9 (H) 04/13/2016     No Known Allergies    Outpatient Medications Prior to Visit  Medication Sig Dispense Refill  . amLODipine (NORVASC) 10 MG tablet Take 1 tablet (10 mg total) by mouth daily. 90 tablet 1  . cyclobenzaprine (FLEXERIL) 10 MG tablet Take 1 tablet (10 mg total) by mouth 2 (two) times daily as needed for muscle spasms. 20 tablet 0  . diclofenac (VOLTAREN) 50 MG EC tablet Take 1 tablet (50 mg total) by mouth 2 (two) times daily. 15 tablet 0  . Garlic 1000 MG CAPS Take by mouth.    . Omega-3 Fatty Acids (FISH OIL) 1200 MG CPDR Take by mouth.    . Turmeric 500 MG CAPS Take by mouth.    . vitamin B-12 (CYANOCOBALAMIN) 1000 MCG tablet Take 1,000 mcg by mouth daily.     No facility-administered medications prior to visit.      Review of Systems  Constitutional: Negative for chills, fatigue and fever.  Eyes:       Denies changes in vision  Respiratory: Negative for chest tightness and shortness of breath.   Cardiovascular: Negative for chest pain, palpitations and leg swelling.  Endocrine: Negative for polydipsia, polyphagia and polyuria.  Neurological: Negative for numbness.        Objective:    BP 114/78 (BP Location: Left Arm, Patient Position: Sitting, Cuff Size: Large)   Pulse 94   Temp 98.4 F (36.9 C) (Oral)   Resp 16   Ht 5\' 8"  (1.727 m)   Wt 205 lb (93 kg)   LMP 03/26/2016   SpO2 92%   BMI 31.17 kg/m  Nursing note and vital signs reviewed.  Physical Exam  Constitutional: She is oriented to person, place, and time. She appears well-developed and well-nourished. No distress.  Cardiovascular: Normal rate, regular rhythm, normal heart sounds and intact distal pulses.   Pulmonary/Chest: Effort normal and breath sounds normal.  Neurological: She is alert and oriented to person, place, and time.  Diabetic foot exam - bilateral feet are free from skin breakdown, cuts, and abrasions. Toenails are neatly trimmed. Pulses are intact and appropriate. Sensation is intact to monofilament bilaterally.  Skin: Skin is warm and dry.  Psychiatric: She has a normal mood and affect. Her behavior is normal. Judgment and thought content normal.       Assessment & Plan:   Problem List Items Addressed This Visit      Endocrine   Type 2 diabetes mellitus (HCC)    Newly diagnosed type 2 diabetes with A1c of 6.9. Start metformin and start. Diabetic foot exam completed today.  Encouraged to complete diabetic eye exam independently. Not currently maintained on statin or Ace/ARB for CAD risk reduction. Referral to diabetic education placed. Declines Pneumovax today. Encouraged to monitor blood sugars about 3 times per week initially. Instructed on proper medication administration and possible side effects. Follow-up in 3 months or sooner if needed.      Relevant Medications   metFORMIN (GLUCOPHAGE XR) 500 MG 24 hr tablet       I am having Savannah Morales start on metFORMIN. I am also having her maintain her Garlic, vitamin B-12, Fish Oil, Turmeric, diclofenac, cyclobenzaprine, and amLODipine.   Meds ordered this encounter  Medications  . metFORMIN (GLUCOPHAGE XR) 500 MG  24 hr tablet    Sig: Take 1 tablet (500 mg total) by mouth daily with breakfast.    Dispense:  30 tablet    Refill:  2    Order Specific Question:   Supervising Provider    Answer:   Hillard Danker A [[id number]]     Follow-up: Return in about 3 months (around 07/20/2016), or if symptoms worsen or fail to improve.  Jeanine Luz, FNP

## 2016-04-21 NOTE — Patient Instructions (Signed)
Thank you for choosing [facility].  SUMMARY AND INSTRUCTIONS:  Check blood sugars 2-3x per week.   You will receive a call for your nutrition/diabetic education.  Follow up in 3 months for A1c.   Medication:  Please start taking metformin daily.  A blood sugar monitor will be sent to your pharamcy.  Your prescription(s) have been submitted to your pharmacy or been printed and provided for you. Please take as directed and contact our office if you believe you are having problem(s) with the medication(s) or have any questions.  Follow up:  If your symptoms worsen or fail to improve, please contact our office for further instruction, or in case of emergency go directly to the emergency room at the closest medical facility.    DIABETES CARE INSTRUCTIONS:  Current A1c:  Lab Results  Component Value Date   HGBA1C 6.9 (H) 04/13/2016    A1C Goal: <7.0%  Fasting Blood Sugar Goal: 80-130 Blood sugar check that you take after fasting for at least 8 hours which is usually in the morning before eating.  Post-prandial Blood Sugar Goal: <180 Blood sugar check approximately 1-2 hours after the start of a meal.    Diabetes Prevention Screens:  1.) Ensure you have an annual eye exam by an ophthalmologist or optometrist.   2,) Ensure you have an annual foot exam or when any changes are noted including new onset numbness/tingling or wounds. Check your feet daily!  3.) The American Diabetes Association recommends the Pneumovax vaccination against pneumonia every 5 years.  4.) We will check your kidney function with a urine test on an annual basis.   Diabetes Mellitus and Exercise Exercising regularly is important for your overall health, especially when you have diabetes (diabetes mellitus). Exercising is not only about losing weight. It has many health benefits, such as increasing muscle strength and bone density and reducing body fat and stress. This leads to improved  fitness, flexibility, and endurance, all of which result in better overall health. Exercise has additional benefits for people with diabetes, including:  Reducing appetite.  Helping to lower and control blood glucose.  Lowering blood pressure.  Helping to control amounts of fatty substances (lipids) in the blood, such as cholesterol and triglycerides.  Helping the body to respond better to insulin (improving insulin sensitivity).  Reducing how much insulin the body needs.  Decreasing the risk for heart disease by:  Lowering cholesterol and triglyceride levels.  Increasing the levels of good cholesterol.  Lowering blood glucose levels. What is my activity plan? Your health care provider or certified diabetes educator can help you make a plan for the type and frequency of exercise (activity plan) that works for you. Make sure that you:  Do at least 150 minutes of moderate-intensity or vigorous-intensity exercise each week. This could be brisk walking, biking, or water aerobics.  Do stretching and strength exercises, such as yoga or weightlifting, at least 2 times a week.  Spread out your activity over at least 3 days of the week.  Get some form of physical activity every day.  Do not go more than 2 days in a row without some kind of physical activity.  Avoid being inactive for more than 90 minutes at a time. Take frequent breaks to walk or stretch.  Choose a type of exercise or activity that you enjoy, and set realistic goals.  Start slowly, and gradually increase the intensity of your exercise over time. What do I need to know about managing my  diabetes?  Check your blood glucose before and after exercising.  If your blood glucose is higher than 240 mg/dL (75.1 mmol/L) before you exercise, check your urine for ketones. If you have ketones in your urine, do not exercise until your blood glucose returns to normal.  Know the symptoms of low blood glucose (hypoglycemia) and  how to treat it. Your risk for hypoglycemia increases during and after exercise. Common symptoms of hypoglycemia can include:  Hunger.  Anxiety.  Sweating and feeling clammy.  Confusion.  Dizziness or feeling light-headed.  Increased heart rate or palpitations.  Blurry vision.  Tingling or numbness around the mouth, lips, or tongue.  Tremors or shakes.  Irritability.  Keep a rapid-acting carbohydrate snack available before, during, and after exercise to help prevent or treat hypoglycemia.  Avoid injecting insulin into areas of the body that are going to be exercised. For example, avoid injecting insulin into:  The arms, when playing tennis.  The legs, when jogging.  Keep records of your exercise habits. Doing this can help you and your health care provider adjust your diabetes management plan as needed. Write down:  Food that you eat before and after you exercise.  Blood glucose levels before and after you exercise.  The type and amount of exercise you have done.  When your insulin is expected to peak, if you use insulin. Avoid exercising at times when your insulin is peaking.  When you start a new exercise or activity, work with your health care provider to make sure the activity is safe for you, and to adjust your insulin, medicines, or food intake as needed.  Drink plenty of water while you exercise to prevent dehydration or heat stroke. Drink enough fluid to keep your urine clear or pale yellow. This information is not intended to replace advice given to you by your health care provider. Make sure you discuss any questions you have with your health care provider. Document Released: 07/04/2003 Document Revised: 11/01/2015 Document Reviewed: 09/23/2015 Elsevier Interactive Patient Education  2017 [employer].

## 2016-04-21 NOTE — Assessment & Plan Note (Signed)
Newly diagnosed type 2 diabetes with A1c of 6.9. Start metformin and start. Diabetic foot exam completed today. Encouraged to complete diabetic eye exam independently. Not currently maintained on statin or Ace/ARB for CAD risk reduction. Referral to diabetic education placed. Declines Pneumovax today. Encouraged to monitor blood sugars about 3 times per week initially. Instructed on proper medication administration and possible side effects. Follow-up in 3 months or sooner if needed.

## 2016-07-14 ENCOUNTER — Other Ambulatory Visit: Payer: Self-pay | Admitting: Family

## 2016-07-14 DIAGNOSIS — E119 Type 2 diabetes mellitus without complications: Secondary | ICD-10-CM

## 2016-08-21 DIAGNOSIS — L700 Acne vulgaris: Secondary | ICD-10-CM | POA: Diagnosis not present

## 2016-08-21 DIAGNOSIS — L818 Other specified disorders of pigmentation: Secondary | ICD-10-CM | POA: Diagnosis not present

## 2016-10-06 DIAGNOSIS — Z1231 Encounter for screening mammogram for malignant neoplasm of breast: Secondary | ICD-10-CM | POA: Diagnosis not present

## 2016-10-06 DIAGNOSIS — Z01419 Encounter for gynecological examination (general) (routine) without abnormal findings: Secondary | ICD-10-CM | POA: Diagnosis not present

## 2016-10-07 ENCOUNTER — Encounter: Payer: Self-pay | Admitting: Family

## 2016-10-13 ENCOUNTER — Other Ambulatory Visit (INDEPENDENT_AMBULATORY_CARE_PROVIDER_SITE_OTHER): Payer: 59

## 2016-10-13 ENCOUNTER — Encounter: Payer: Self-pay | Admitting: Family

## 2016-10-13 ENCOUNTER — Ambulatory Visit (INDEPENDENT_AMBULATORY_CARE_PROVIDER_SITE_OTHER): Payer: 59 | Admitting: Family

## 2016-10-13 ENCOUNTER — Other Ambulatory Visit: Payer: Self-pay | Admitting: Family

## 2016-10-13 VITALS — BP 122/88 | HR 92 | Temp 98.7°F | Resp 16 | Ht 68.0 in | Wt 202.1 lb

## 2016-10-13 DIAGNOSIS — Z23 Encounter for immunization: Secondary | ICD-10-CM | POA: Diagnosis not present

## 2016-10-13 DIAGNOSIS — I10 Essential (primary) hypertension: Secondary | ICD-10-CM

## 2016-10-13 DIAGNOSIS — E119 Type 2 diabetes mellitus without complications: Secondary | ICD-10-CM

## 2016-10-13 LAB — HEMOGLOBIN A1C: Hgb A1c MFr Bld: 6.7 % — ABNORMAL HIGH (ref 4.6–6.5)

## 2016-10-13 LAB — MICROALBUMIN / CREATININE URINE RATIO
Creatinine,U: 199.1 mg/dL
Microalb Creat Ratio: 1.1 mg/g (ref 0.0–30.0)
Microalb, Ur: 2.1 mg/dL — ABNORMAL HIGH (ref 0.0–1.9)

## 2016-10-13 NOTE — Assessment & Plan Note (Signed)
Obtain hemoglobin A1c and urine microalbumin. Will maintained on metformin with no adverse side effects or hypoglycemic readings. Pneumovax updated today. Not currently maintained on Ace/ARB for CAD risk reduction. Diabetic foot exam completed today. Diabetic eye exam is up-to-date per recommendations. Encouraged to monitor blood sugars at home 2-3 times per week. Continue current dosage of metformin. Follow up in 6 months or sooner if needed. [name]

## 2016-10-13 NOTE — Patient Instructions (Signed)
Thank you for choosing [facility].  SUMMARY AND INSTRUCTIONS:  Please continue to take your medications as prescribed.   We will check your A1c today.  Continue with physical activity and nutrition management.   We will plan to follow up in 6 months pending your A1c results.    Labs:  Please stop by the lab on the lower level of the building for your blood work. Your results will be released to MyChart (or called to you) after review, usually within 72 hours after test completion. If any changes need to be made, you will be notified at that same time.  1.) The lab is open from 7:30am to 5:30 pm Monday-Friday 2.) No appointment is necessary 3.) Fasting (if needed) is 6-8 hours after food and drink; black coffee and water are okay   Follow up:  If your symptoms worsen or fail to improve, please contact our office for further instruction, or in case of emergency go directly to the emergency room at the closest medical facility.

## 2016-10-13 NOTE — Progress Notes (Signed)
Subjective:    Patient ID: Savannah Morales, female    DOB: 09-11-1965, 51 y.o.   MRN: [id number]  Chief Complaint  Patient presents with  . Follow-up    diabetes check    HPI:  Savannah Morales is a 51 y.o. female who  has a past medical history of History of chicken pox; Hypertension; and Kidney stones. and presents today for a follow up office visit.   Currently maintained on metformin. Reports taking the medication as prescribed and denies adverse side effects or hypoglycemic readings. Not currently checking her blood sugars at home.  Denies new symptoms of end organ damage. No excessive hunger, thirst, or urination. Working on a low/carbohydrate modified oral intake. Physical activity  Lab Results  Component Value Date   HGBA1C 6.7 (H) 10/13/2016     Lab Results  Component Value Date   CREATININE 0.71 04/13/2016   BUN 10 04/13/2016   NA 140 04/13/2016   K 4.2 04/13/2016   CL 105 04/13/2016   CO2 24 04/13/2016      No Known Allergies    Outpatient Medications Prior to Visit  Medication Sig Dispense Refill  . amLODipine (NORVASC) 10 MG tablet TAKE 1 TABLET BY MOUTH EVERY DAY 90 tablet 0  . cyclobenzaprine (FLEXERIL) 10 MG tablet Take 1 tablet (10 mg total) by mouth 2 (two) times daily as needed for muscle spasms. 20 tablet 0  . diclofenac (VOLTAREN) 50 MG EC tablet Take 1 tablet (50 mg total) by mouth 2 (two) times daily. 15 tablet 0  . Garlic 1000 MG CAPS Take by mouth.    . metFORMIN (GLUCOPHAGE-XR) 500 MG 24 hr tablet TAKE 1 TABLET BY MOUTH DAILY WITH BREAKFAST 90 tablet 0  . Omega-3 Fatty Acids (FISH OIL) 1200 MG CPDR Take by mouth.    . Turmeric 500 MG CAPS Take by mouth.    . vitamin B-12 (CYANOCOBALAMIN) 1000 MCG tablet Take 1,000 mcg by mouth daily.     No facility-administered medications prior to visit.      Review of Systems  Constitutional: Negative for chills, fatigue and fever.  Eyes:       Denies changes in vision  Respiratory: Negative for  chest tightness and shortness of breath.   Cardiovascular: Negative for chest pain, palpitations and leg swelling.  Endocrine: Negative for polydipsia, polyphagia and polyuria.  Neurological: Negative for numbness.      Objective:    BP 122/88 (BP Location: Left Arm, Patient Position: Sitting, Cuff Size: Large)   Pulse 92   Temp 98.7 F (37.1 C) (Oral)   Resp 16   Ht 5\' 8"  (1.727 m)   Wt 202 lb 1.9 oz (91.7 kg)   SpO2 95%   BMI 30.73 kg/m  Nursing note and vital signs reviewed.  Physical Exam  Constitutional: She is oriented to person, place, and time. She appears well-developed and well-nourished. No distress.  Cardiovascular: Normal rate, regular rhythm, normal heart sounds and intact distal pulses.   Pulmonary/Chest: Effort normal and breath sounds normal.  Neurological: She is alert and oriented to person, place, and time.  Skin: Skin is warm and dry.  Psychiatric: She has a normal mood and affect. Her behavior is normal. Judgment and thought content normal.       Assessment & Plan:   Problem List Items Addressed This Visit      Endocrine   Type 2 diabetes mellitus (HCC) - Primary    Obtain hemoglobin A1c and urine microalbumin. Will maintained  on metformin with no adverse side effects or hypoglycemic readings. Pneumovax updated today. Not currently maintained on Ace/ARB for CAD risk reduction. Diabetic foot exam completed today. Diabetic eye exam is up-to-date per recommendations. Encouraged to monitor blood sugars at home 2-3 times per week. Continue current dosage of metformin. Follow up in 6 months or sooner if needed. .      Relevant Orders   Hemoglobin A1c (Completed)   Urine Microalbumin w/creat. ratio (Completed)    Other Visit Diagnoses    Need for 23-polyvalent pneumococcal polysaccharide vaccine       Relevant Orders   Pneumococcal polysaccharide vaccine 23-valent greater than or equal to 2yo subcutaneous/IM (Completed)       I am having Ms. Langhorne  maintain her Garlic, vitamin B-12, Fish Oil, Turmeric, diclofenac, cyclobenzaprine, metFORMIN, and amLODipine.   Follow-up: Return in about 6 months (around 04/14/2017), or if symptoms worsen or fail to improve.  Jeanine Luz, FNP

## 2016-10-15 DIAGNOSIS — Z719 Counseling, unspecified: Secondary | ICD-10-CM | POA: Diagnosis not present

## 2016-10-29 DIAGNOSIS — Z719 Counseling, unspecified: Secondary | ICD-10-CM | POA: Diagnosis not present

## 2016-11-05 DIAGNOSIS — Z719 Counseling, unspecified: Secondary | ICD-10-CM | POA: Diagnosis not present

## 2016-11-19 DIAGNOSIS — Z719 Counseling, unspecified: Secondary | ICD-10-CM | POA: Diagnosis not present

## 2016-11-26 DIAGNOSIS — Z719 Counseling, unspecified: Secondary | ICD-10-CM | POA: Diagnosis not present

## 2016-12-10 DIAGNOSIS — Z719 Counseling, unspecified: Secondary | ICD-10-CM | POA: Diagnosis not present

## 2016-12-17 DIAGNOSIS — Z719 Counseling, unspecified: Secondary | ICD-10-CM | POA: Diagnosis not present

## 2016-12-22 ENCOUNTER — Other Ambulatory Visit: Payer: Self-pay | Admitting: Family

## 2016-12-22 DIAGNOSIS — E119 Type 2 diabetes mellitus without complications: Secondary | ICD-10-CM

## 2017-01-07 DIAGNOSIS — Z719 Counseling, unspecified: Secondary | ICD-10-CM | POA: Diagnosis not present

## 2017-01-09 ENCOUNTER — Other Ambulatory Visit: Payer: Self-pay | Admitting: Family

## 2017-01-09 DIAGNOSIS — I10 Essential (primary) hypertension: Secondary | ICD-10-CM

## 2017-01-14 DIAGNOSIS — Z719 Counseling, unspecified: Secondary | ICD-10-CM | POA: Diagnosis not present

## 2017-01-31 ENCOUNTER — Other Ambulatory Visit: Payer: Self-pay | Admitting: Family

## 2017-01-31 DIAGNOSIS — I10 Essential (primary) hypertension: Secondary | ICD-10-CM

## 2017-01-31 DIAGNOSIS — E119 Type 2 diabetes mellitus without complications: Secondary | ICD-10-CM

## 2017-02-01 MED ORDER — AMLODIPINE BESYLATE 10 MG PO TABS: 10.0000 mg | ORAL_TABLET | Freq: Every day | ORAL | 0 refills | Status: DC

## 2017-02-01 MED ORDER — METFORMIN HCL ER 500 MG PO TB24: 500.0000 mg | ORAL_TABLET | Freq: Every day | ORAL | 0 refills | Status: DC

## 2017-04-14 ENCOUNTER — Encounter: Payer: Self-pay | Admitting: Family Medicine

## 2017-04-14 ENCOUNTER — Ambulatory Visit: Payer: 59 | Admitting: Family Medicine

## 2017-04-14 VITALS — BP 126/84 | HR 94 | Temp 98.6°F | Ht 68.0 in | Wt 198.2 lb

## 2017-04-14 DIAGNOSIS — G629 Polyneuropathy, unspecified: Secondary | ICD-10-CM | POA: Diagnosis not present

## 2017-04-14 DIAGNOSIS — I10 Essential (primary) hypertension: Secondary | ICD-10-CM

## 2017-04-14 DIAGNOSIS — Z23 Encounter for immunization: Secondary | ICD-10-CM | POA: Diagnosis not present

## 2017-04-14 DIAGNOSIS — E1159 Type 2 diabetes mellitus with other circulatory complications: Secondary | ICD-10-CM

## 2017-04-14 DIAGNOSIS — I152 Hypertension secondary to endocrine disorders: Secondary | ICD-10-CM

## 2017-04-14 DIAGNOSIS — E119 Type 2 diabetes mellitus without complications: Secondary | ICD-10-CM

## 2017-04-14 LAB — COMPREHENSIVE METABOLIC PANEL
ALT: 10 U/L (ref 0–35)
AST: 9 U/L (ref 0–37)
Albumin: 4 g/dL (ref 3.5–5.2)
Alkaline Phosphatase: 60 U/L (ref 39–117)
BUN: 8 mg/dL (ref 6–23)
CO2: 26 mEq/L (ref 19–32)
Calcium: 8.9 mg/dL (ref 8.4–10.5)
Chloride: 105 mEq/L (ref 96–112)
Creatinine, Ser: 0.66 mg/dL (ref 0.40–1.20)
GFR: 122.18 mL/min (ref >60.00)
Glucose, Bld: 96 mg/dL (ref 70–99)
Potassium: 4.2 mEq/L (ref 3.5–5.1)
Sodium: 139 mEq/L (ref 135–145)
Total Bilirubin: 0.4 mg/dL (ref 0.2–1.2)
Total Protein: 7.4 g/dL (ref 6.0–8.3)

## 2017-04-14 LAB — LIPID PANEL
Cholesterol: 133 mg/dL (ref 0–200)
HDL: 61.3 mg/dL (ref >39.00)
LDL Cholesterol: 55 mg/dL (ref 0–99)
NonHDL: 71.8
Total CHOL/HDL Ratio: 2
Triglycerides: 83 mg/dL (ref 0.0–149.0)
VLDL: 16.6 mg/dL (ref 0.0–40.0)

## 2017-04-14 LAB — CBC
HCT: 39.4 % (ref 36.0–46.0)
Hemoglobin: 12.7 g/dL (ref 12.0–15.0)
MCHC: 32.2 g/dL (ref 30.0–36.0)
MCV: 83.3 fl (ref 78.0–100.0)
Platelets: 205 10*3/uL (ref 150.0–400.0)
RBC: 4.73 Mil/uL (ref 3.87–5.11)
RDW: 14.2 % (ref 11.5–15.5)
WBC: 5.2 10*3/uL (ref 4.0–10.5)

## 2017-04-14 LAB — POCT GLYCOSYLATED HEMOGLOBIN (HGB A1C): Hemoglobin A1C: 6.5

## 2017-04-14 LAB — VITAMIN B12: Vitamin B-12: 543 pg/mL (ref 211–911)

## 2017-04-14 LAB — FOLATE: Folate: >23.9 ng/mL (ref >5.9)

## 2017-04-14 LAB — TSH: TSH: 0.49 u[IU]/mL (ref 0.35–4.50)

## 2017-04-14 MED ORDER — METFORMIN HCL ER 500 MG PO TB24: 500.0000 mg | ORAL_TABLET | Freq: Every day | ORAL | 3 refills | Status: DC

## 2017-04-14 MED ORDER — AMLODIPINE BESYLATE 10 MG PO TABS: 10.0000 mg | ORAL_TABLET | Freq: Every day | ORAL | 3 refills | Status: DC

## 2017-04-14 NOTE — Assessment & Plan Note (Signed)
At goal.  Continue amlodipine.  Follow-up 6 months.

## 2017-04-14 NOTE — Assessment & Plan Note (Signed)
A1c improved to 6.5 today.  Foot exam performed today.  Continue metformin.  Congratulated patient on 4 pound weight loss.  Encouraged her to continue making healthy diet choices.  Follow-up in 6 months.

## 2017-04-14 NOTE — Progress Notes (Signed)
    Subjective:  Savannah Morales is a 51 y.o. female who presents today with a chief complaint of hypertension and to transfer care to this office.   HPI:  Hypertension, established problem, Stable BP Readings from Last 3 Encounters:  04/14/17 126/84  10/13/16 122/88  04/21/16 114/78   Currently on amlodipine 10 mg daily.  She is compliant with this denies any side effects.  Does not check blood pressure at home.  ROS: Denies any chest pain, shortness of breath, dyspnea on exertion, leg edema.    DIABETES Type II, established problem, Stable Currently on metformin Exar 500 mg daily.  Compliant with this dose without any sort of side effects.  Does not regularly check blood sugars.  She has been working on cutting out sugar sweetened beverages.  ROS: Denies Polyuria,Polydipsia, Denies  Hypoglycemia symptoms (palpitations, tremors, anxiousness)   Abnormal toe sensation, new issue Started a few months ago.  Sensation is located to the lateral toes on her left foot.  Denies pain.  Denies any weakness or numbness.  No treatments tried.  States that it "feels weird".  No fevers or chills.  No rashes.  No history of trauma.  No obvious precipitating events.  No obvious alleviating or aggravating factors. Cutting out sodas.   ROS: Per HPI  PMH: Smoking history reviewed. Never smoker.   Objective:  Physical Exam: BP 126/84 (BP Location: Left Arm, Patient Position: Sitting, Cuff Size: Normal)   Pulse 94   Temp 98.6 F (37 C) (Oral)   Ht 5\' 8"  (1.727 m)   Wt 198 lb 3.2 oz (89.9 kg)   SpO2 100%   BMI 30.14 kg/m   Gen: NAD, resting comfortably CV: RRR with no murmurs appreciated Pulm: NWOB, CTAB with no crackles, wheezes, or rhonchi MSK: -Left foot: Evidence of prior ankle surgery noted.  No other deformities noted.  Normal sensation throughout foot and toe.  Strength 5 out of 5 throughout.  Neurovascularly intact distally.  Assessment/Plan:  Hypertension associated with diabetes  (HCC) At goal.  Continue amlodipine.  Follow-up 6 months.  Type 2 diabetes mellitus (HCC) A1c improved to 6.5 today.  Foot exam performed today.  Continue metformin.  Congratulated patient on 4 pound weight loss.  Encouraged her to continue making healthy diet choices.  Follow-up in 6 months.  Neuropathy Likely secondary to diabetes.  Check TSH, folate, and B12 levels today to rule out other causes.  Preventative healthcare Flu shot given today.  Check lipid panel today.  Katina Degree. [name], MD 04/14/2017 9:17 AM

## 2017-04-14 NOTE — Patient Instructions (Signed)
No changes today. Continue the good work!  We will check blood work.  Come back to see me in 6 months, or sooner as needed.  Take care, Dr [name]

## 2017-04-15 NOTE — Progress Notes (Signed)
Labs all normal. Please inform patient.  Her abnormal toe sensation is likely due to her elevated blood sugars. She should keep up the good working keeping those values low.  She should come back to see me in 6 months, or sooner as needed.  Savannah Morales. [name], MD 04/15/2017 12:43 PM

## 2017-06-08 ENCOUNTER — Ambulatory Visit: Payer: 59 | Admitting: Nurse Practitioner

## 2017-09-10 DIAGNOSIS — L700 Acne vulgaris: Secondary | ICD-10-CM | POA: Diagnosis not present

## 2017-10-19 DIAGNOSIS — Z01419 Encounter for gynecological examination (general) (routine) without abnormal findings: Secondary | ICD-10-CM | POA: Diagnosis not present

## 2017-10-19 DIAGNOSIS — Z1231 Encounter for screening mammogram for malignant neoplasm of breast: Secondary | ICD-10-CM | POA: Diagnosis not present

## 2017-10-19 DIAGNOSIS — Z6829 Body mass index (BMI) 29.0-29.9, adult: Secondary | ICD-10-CM | POA: Diagnosis not present

## 2017-11-02 ENCOUNTER — Encounter: Payer: 59 | Admitting: Family Medicine

## 2017-11-08 ENCOUNTER — Encounter: Payer: Self-pay | Admitting: Family Medicine

## 2017-11-08 ENCOUNTER — Ambulatory Visit: Payer: 59 | Admitting: Family Medicine

## 2017-11-08 VITALS — BP 128/78 | HR 105 | Temp 98.5°F | Ht 68.0 in | Wt 193.0 lb

## 2017-11-08 DIAGNOSIS — M791 Myalgia, unspecified site: Secondary | ICD-10-CM

## 2017-11-08 DIAGNOSIS — E1159 Type 2 diabetes mellitus with other circulatory complications: Secondary | ICD-10-CM

## 2017-11-08 DIAGNOSIS — E119 Type 2 diabetes mellitus without complications: Secondary | ICD-10-CM

## 2017-11-08 DIAGNOSIS — I152 Hypertension secondary to endocrine disorders: Secondary | ICD-10-CM

## 2017-11-08 DIAGNOSIS — I10 Essential (primary) hypertension: Secondary | ICD-10-CM

## 2017-11-08 DIAGNOSIS — Z1211 Encounter for screening for malignant neoplasm of colon: Secondary | ICD-10-CM

## 2017-11-08 LAB — POCT GLYCOSYLATED HEMOGLOBIN (HGB A1C): Hemoglobin A1C: 6.1 % — AB (ref 4.0–5.6)

## 2017-11-08 NOTE — Progress Notes (Signed)
   Subjective:  Savannah Morales is a 52 y.o. female who presents today with a chief complaint of T2DM follow up.   HPI:  T2DM, chronic problem Currently on metformin 500mg  daily.  Doing well without reported side effect.  She is trying to be more active at work.  Hypertension, chronic problem Currently on amlodipine 10 mg daily.  Tolerates this well without side effect.  No reported chest pain or shortness of breath.  Arm pain, acute issue Symptoms started 2 to 3 weeks ago.  Located her left upper arm.  No obvious falls, injuries, or other precipitating events.  Has not tried any treatments.  Worse with certain movements.  Described as an achy sensation.  No other treatments tried.  ROS: Per HPI  PMH: She reports that she has never smoked. She has never used smokeless tobacco. She reports that she drinks alcohol. She reports that she does not use drugs.  Objective:  Physical Exam: BP 128/78 (BP Location: Left Arm, Patient Position: Sitting, Cuff Size: Normal)   Pulse (!) 105   Temp 98.5 F (36.9 C) (Oral)   Ht 5\' 8"  (1.727 m)   Wt 193 lb (87.5 kg)   SpO2 98%   BMI 29.35 kg/m   Wt Readings from Last 3 Encounters:  11/08/17 193 lb (87.5 kg)  04/14/17 198 lb 3.2 oz (89.9 kg)  10/13/16 202 lb 1.9 oz (91.7 kg)  Gen: NAD, resting comfortably CV: RRR with no murmurs appreciated Pulm: NWOB, CTAB with no crackles, wheezes, or rhonchi GI: Normal bowel sounds present. Soft, Nontender, Nondistended. MSK:  -Left upper extremity: Tender palpation along medial edge of biceps.  Speeds and Yergason's negative.  Mild tenderness with resisted supination.  Neurovascular intact distally.  Tinel sign negative.  Phalen's test negative. - Neck: No deformities.  Full range of motion.  Spurling test negative bilaterally.  Assessment/Plan:  Type 2 diabetes mellitus (HCC) A1c 6.1 today.  Congratulated patient on improved glycemic score and weight loss.  Will continue metformin 500 mg daily.   Follow-up in 6 months.  Hypertension associated with diabetes (HCC) At goal.  Continue amlodipine 10 mg daily.  Myalgia Likely secondary to biceps strain.  No red flag signs or symptoms.  No signs of cervical radiculopathy or carpal tunnel syndrome.  Recommended patient to avoid activities that make her pain worse.  Recommended over-the-counter anti-inflammatories as needed.  Discussed reasons to return to care and typical course of illness.  Preventive health care Will obtain records regarding her most recent mammogram.  Will order Cologuard for colon cancer screening.  Katina Degree. [name], MD 11/08/2017 4:13 PM

## 2017-11-08 NOTE — Patient Instructions (Addendum)
It was very nice to see you today!  Your A1c looks great. Please keep up the good work! You are doing a great job taking care of yourself.   No medication changes today.  You have a strained muscle in your arm.  This should improve over the next couple of weeks.  Please avoid any activities that make her symptoms worse.  Please let me know if you would like for me to send an anti-inflammatory.  I would like to get records from most recent mammogram.  We will order a Cologuard test.  Please check with your insurance to make sure it is covered.  Come back to see me in 6-12 months, or sooner as needed.  Take care, Dr [name]

## 2017-11-08 NOTE — Assessment & Plan Note (Signed)
At goal  Continue amlodipine 10mg daily

## 2017-11-08 NOTE — Assessment & Plan Note (Signed)
A1c 6.1 today.  Congratulated patient on improved glycemic score and weight loss.  Will continue metformin 500 mg daily.  Follow-up in 6 months.

## 2017-11-22 DIAGNOSIS — Z1211 Encounter for screening for malignant neoplasm of colon: Secondary | ICD-10-CM | POA: Diagnosis not present

## 2017-12-01 LAB — COLOGUARD: Cologuard: NEGATIVE

## 2017-12-02 ENCOUNTER — Telehealth: Payer: Self-pay | Admitting: Family Medicine

## 2017-12-02 NOTE — Telephone Encounter (Signed)
LM for patient to return call.  CRM placed. 

## 2017-12-02 NOTE — Telephone Encounter (Signed)
Please let patient know her cologuard test was NORMAL and she has no signs of colon cancer.  We can repeat again in 3 years.  Savannah Morales. [name], MD 12/02/2017 1:10 PM

## 2017-12-02 NOTE — Telephone Encounter (Signed)
Pt has been made aware of results

## 2018-01-04 ENCOUNTER — Encounter: Payer: Self-pay | Admitting: Family Medicine

## 2018-01-06 ENCOUNTER — Ambulatory Visit: Payer: 59 | Admitting: Family Medicine

## 2018-01-06 ENCOUNTER — Encounter: Payer: Self-pay | Admitting: Family Medicine

## 2018-01-06 VITALS — BP 108/76 | HR 84 | Temp 98.7°F | Ht 68.0 in | Wt 191.8 lb

## 2018-01-06 DIAGNOSIS — J0110 Acute frontal sinusitis, unspecified: Secondary | ICD-10-CM

## 2018-01-06 MED ORDER — BENZONATATE 100 MG PO CAPS: 100.0000 mg | ORAL_CAPSULE | Freq: Three times a day (TID) | ORAL | 0 refills | Status: AC | PRN

## 2018-01-06 MED ORDER — PREDNISONE 20 MG PO TABS: ORAL_TABLET | ORAL | 0 refills | Status: DC

## 2018-01-06 MED ORDER — AZITHROMYCIN 250 MG PO TABS
ORAL_TABLET | ORAL | 0 refills | Status: DC
Start: 2018-01-06 — End: 2018-11-10

## 2018-01-06 NOTE — Patient Instructions (Addendum)
Sinsusitis Viral based on <10 days, no double sickening, lack of severity of symptoms in first 3 days. Educated on signs that bacterial infection may have developed (symptoms over 10 days, double sickening).   Her lungs are clear but cough sounds very deep- this could also be bronchitis (which we discussed has over 90% chance of being viral). Treatments below may also provide some relief for bronchitis.   Treatment: -considered steroid: we opted in- low dose prednisone sent in- has diabetes but very well controlled -other symptomatic care with tessalon for cough, mucinex can be used to loose secretions -Antibiotic indicated: no but we discussed starting azithromycin on Saturday Sunday if her symptoms are not improving (likely help sinusitis and if she is in 5-10% group of folks with bacterial bronchitis may help)   Finally, we reviewed reasons to return to care including if symptoms worsen or persist or new concerns arise (particularly fever or shortness of breath)  Meds ordered this encounter  Medications  . predniSONE (DELTASONE) 20 MG tablet    Sig: Take 1 tablet by mouth daily for 5 days, then 1/2 tablet daily for 2 days    Dispense:  6 tablet    Refill:  0  . benzonatate (TESSALON) 100 MG capsule    Sig: Take 1 capsule (100 mg total) by mouth 3 (three) times daily as needed for up to 20 days for cough.    Dispense:  30 capsule    Refill:  0  . azithromycin (ZITHROMAX) 250 MG tablet    Sig: Take 2 tabs on day 1, then 1 tab daily until finished    Dispense:  6 tablet    Refill:  0

## 2018-01-06 NOTE — Progress Notes (Signed)
PCP: Ardith Dark, MD  Subjective:  Savannah Morales is a 52 y.o. year old very pleasant female patient who presents with sinusitis and possible bronchitis symptoms including nasal congestion with clear discharge, cough with rare purulent sputum, frontal headaches -other symptoms include: some chest congestion. Cough sounds deep and Work made her come in because she sounded so bad. She is fatigued btu able to sleep at night in between coughing. She originally thought URI but just not improving.  -day of illness:8 d -Symptoms -Stable in coursenot improving or worsening -previous treatments: Robitussen, nyquil tried, vicks pills -sick contacts/travel/risks: denies flu exposure.   ROS-denies fever, SOB, NVD, tooth pain  Pertinent Past Medical History-  Patient Active Problem List   Diagnosis Date Noted  . Type 2 diabetes mellitus (HCC) 04/21/2016  . Hypertension associated with diabetes (HCC) 03/17/2016  . H/O urinary stone 03/17/2016    Medications- reviewed  Current Outpatient Medications  Medication Sig Dispense Refill  . amLODipine (NORVASC) 10 MG tablet Take 1 tablet (10 mg total) by mouth daily. Follow-up appt due in Dec must see provider for future refills 90 tablet 3  . Garlic 1000 MG CAPS Take by mouth.    . metFORMIN (GLUCOPHAGE-XR) 500 MG 24 hr tablet Take 1 tablet (500 mg total) by mouth daily with breakfast. Follow-up appt due in Dec must see provider for future refills 90 tablet 3  . Omega-3 Fatty Acids (FISH OIL) 1200 MG CPDR Take by mouth.    . Turmeric 500 MG CAPS Take by mouth.    . vitamin B-12 (CYANOCOBALAMIN) 1000 MCG tablet Take 1,000 mcg by mouth daily.     Objective: BP 108/76 (BP Location: Left Arm, Patient Position: Sitting, Cuff Size: Large)   Pulse 84   Temp 98.7 F (37.1 C) (Oral)   Ht 5\' 8"  (1.727 m)   Wt 191 lb 12.8 oz (87 kg)   SpO2 98%   BMI 29.16 kg/m  Gen: NAD, resting comfortably HEENT: Turbinates erythematous with yellow drainage, TM  normal, pharynx mildly erythematous with no tonsilar exudate or edema, minimal sinus tenderness CV: RRR no murmurs rubs or gallops Lungs: CTAB no crackles, wheeze, rhonchi Ext: no edema Skin: warm, dry, no rash  Assessment/Plan:  Sinsusitis Viral based on <10 days, no double sickening, lack of severity of symptoms in first 3 days. Educated on signs that bacterial infection may have developed (symptoms over 10 days, double sickening).   Her lungs are clear but cough sounds very deep- this could also be bronchitis (which we discussed has over 90% chance of being viral). Treatments below may also provide some relief for bronchitis.   Treatment: -considered steroid: we opted in- low dose prednisone sent in- has diabetes but very well controlled -other symptomatic care with tessalon for cough, mucinex can be used to loose secretions -Antibiotic indicated: no but we discussed starting azithromycin on Saturday Sunday if her symptoms are not improving (likely help sinusitis and if she is in 5-10% group of folks with bacterial bronchitis may help)   Finally, we reviewed reasons to return to care including if symptoms worsen or persist or new concerns arise (particularly fever or shortness of breath)  Meds ordered this encounter  Medications  . predniSONE (DELTASONE) 20 MG tablet    Sig: Take 1 tablet by mouth daily for 5 days, then 1/2 tablet daily for 2 days    Dispense:  6 tablet    Refill:  0  . benzonatate (TESSALON) 100 MG capsule  Sig: Take 1 capsule (100 mg total) by mouth 3 (three) times daily as needed for up to 20 days for cough.    Dispense:  30 capsule    Refill:  0  . azithromycin (ZITHROMAX) 250 MG tablet    Sig: Take 2 tabs on day 1, then 1 tab daily until finished    Dispense:  6 tablet    Refill:  0    Tana Conch, MD

## 2018-04-12 ENCOUNTER — Other Ambulatory Visit: Payer: Self-pay | Admitting: Family Medicine

## 2018-04-12 DIAGNOSIS — E119 Type 2 diabetes mellitus without complications: Secondary | ICD-10-CM

## 2018-05-04 ENCOUNTER — Other Ambulatory Visit: Payer: Self-pay | Admitting: Family Medicine

## 2018-05-04 DIAGNOSIS — I10 Essential (primary) hypertension: Secondary | ICD-10-CM

## 2018-07-10 ENCOUNTER — Other Ambulatory Visit: Payer: Self-pay

## 2018-07-10 ENCOUNTER — Encounter (HOSPITAL_COMMUNITY): Payer: Self-pay | Admitting: Emergency Medicine

## 2018-07-10 ENCOUNTER — Emergency Department (HOSPITAL_COMMUNITY)
Admission: EM | Admit: 2018-07-10 | Discharge: 2018-07-10 | Disposition: A | Discharge status: Home / Self Care | Payer: 59 | Attending: Emergency Medicine | Admitting: Emergency Medicine

## 2018-07-10 DIAGNOSIS — Z7984 Long term (current) use of oral hypoglycemic drugs: Secondary | ICD-10-CM | POA: Diagnosis not present

## 2018-07-10 DIAGNOSIS — M545 Low back pain, unspecified: Secondary | ICD-10-CM

## 2018-07-10 DIAGNOSIS — E119 Type 2 diabetes mellitus without complications: Secondary | ICD-10-CM | POA: Diagnosis not present

## 2018-07-10 DIAGNOSIS — M62830 Muscle spasm of back: Secondary | ICD-10-CM | POA: Diagnosis not present

## 2018-07-10 DIAGNOSIS — I10 Essential (primary) hypertension: Secondary | ICD-10-CM | POA: Diagnosis not present

## 2018-07-10 DIAGNOSIS — Z79899 Other long term (current) drug therapy: Secondary | ICD-10-CM | POA: Diagnosis not present

## 2018-07-10 DIAGNOSIS — M5489 Other dorsalgia: Secondary | ICD-10-CM | POA: Diagnosis not present

## 2018-07-10 HISTORY — DX: Type 2 diabetes mellitus without complications: E11.9

## 2018-07-10 MED ORDER — KETOROLAC TROMETHAMINE 60 MG/2ML IM SOLN
60.0000 mg | Freq: Once | INTRAMUSCULAR | Status: AC
  Administered 2018-07-10: 60 mg via INTRAMUSCULAR
  Filled 2018-07-10: qty 2

## 2018-07-10 MED ORDER — CYCLOBENZAPRINE HCL 10 MG PO TABS
10.0000 mg | ORAL_TABLET | Freq: Once | ORAL | Status: AC
  Administered 2018-07-10: 10 mg via ORAL
  Filled 2018-07-10: qty 1

## 2018-07-10 MED ORDER — CYCLOBENZAPRINE HCL 10 MG PO TABS: 10.0000 mg | ORAL_TABLET | Freq: Two times a day (BID) | ORAL | 0 refills | Status: DC | PRN

## 2018-07-10 MED ORDER — KETOROLAC TROMETHAMINE 15 MG/ML IJ SOLN: 15.0000 mg | Freq: Once | INTRAMUSCULAR | Status: DC

## 2018-07-10 NOTE — ED Provider Notes (Signed)
[facility] EMERGENCY DEPARTMENT Provider Note   CSN: [id number] Arrival date & time: 07/10/18  1229    History   Chief Complaint Chief Complaint  Patient presents with  . Back Pain    HPI Savannah Morales is a 53 y.o. female.     The history is provided by the patient.  Back Pain  Location:  Lumbar spine Quality:  Aching, cramping, shooting and stiffness Stiffness is present:  All day Radiates to:  R thigh Pain severity:  Severe Pain is:  Same all the time Onset quality:  Gradual Duration:  2 days Timing:  Constant Progression:  Worsening Chronicity:  Recurrent Context comment:  Is active at baseline but cannot remember doing anything to cause injury Relieved by: improved with lying down. Worsened by:  Bending, movement, twisting and standing Ineffective treatments:  Ibuprofen (tylenol) Associated symptoms: leg pain   Associated symptoms: no abdominal pain, no abdominal swelling, no bladder incontinence, no bowel incontinence, no chest pain, no dysuria, no fever, no numbness, no paresthesias, no pelvic pain, no perianal numbness and no weakness   Risk factors: obesity   Risk factors: no recent surgery and no steroid use     Past Medical History:  Diagnosis Date  . Diabetes mellitus without complication (HCC)    Type II  . History of chicken pox   . Hypertension   . Kidney stones     Patient Active Problem List   Diagnosis Date Noted  . Type 2 diabetes mellitus (HCC) 04/21/2016  . Hypertension associated with diabetes (HCC) 03/17/2016  . H/O urinary stone 03/17/2016    Past Surgical History:  Procedure Laterality Date  . ANKLE SURGERY       OB History   No obstetric history on file.      Home Medications    Prior to Admission medications   Medication Sig Start Date End Date Taking? Authorizing Provider  amLODipine (NORVASC) 10 MG tablet TAKE 1 TABLET BY MOUTH DAILY 05/05/18   Ardith Dark, MD  azithromycin [facility]) 250 MG  tablet Take 2 tabs on day 1, then 1 tab daily until finished 01/06/18   Shelva Majestic, MD  Garlic 1000 MG CAPS Take by mouth.    [provider]  metFORMIN (GLUCOPHAGE-XR) 500 MG 24 hr tablet TAKE 1 TABLET BY MOUTH DAILY WITH BREAKFAST 04/13/18   Ardith Dark, MD  Omega-3 Fatty Acids (FISH OIL) 1200 MG CPDR Take by mouth.    [provider]  predniSONE (DELTASONE) 20 MG tablet Take 1 tablet by mouth daily for 5 days, then 1/2 tablet daily for 2 days 01/06/18   Shelva Majestic, MD  Turmeric 500 MG CAPS Take by mouth.    [provider]  vitamin B-12 (CYANOCOBALAMIN) 1000 MCG tablet Take 1,000 mcg by mouth daily.    [provider]    Family History Family History  Problem Relation Age of Onset  . Hyperlipidemia Mother   . Hypertension Father   . Diabetes Father   . Cancer Paternal Grandmother     Social History Social History   Tobacco Use  . Smoking status: Never Smoker  . Smokeless tobacco: Never Used  Substance Use Topics  . Alcohol use: Yes    Comment: Rarely  . Drug use: No     Allergies   Patient has no known allergies.   Review of Systems Review of Systems  Constitutional: Negative for fever.  Cardiovascular: Negative for chest pain.  Gastrointestinal:  Negative for abdominal pain and bowel incontinence.  Genitourinary: Negative for bladder incontinence, dysuria and pelvic pain.  Musculoskeletal: Positive for back pain.  Neurological: Negative for weakness, numbness and paresthesias.  All other systems reviewed and are negative.    Physical Exam Updated Vital Signs BP 124/77   Pulse (!) 103   Temp 99.2 F (37.3 C)   Resp 18   Ht 5\' 8"  (1.727 m)   Wt 81.6 kg   LMP 03/26/2016   SpO2 100%   BMI 27.37 kg/m   Physical Exam Vitals signs and nursing note reviewed.  Constitutional:      General: She is not in acute distress.    Appearance: She is well-developed.  HENT:     Head: Normocephalic and atraumatic.   Eyes:     Pupils: Pupils are equal, round, and reactive to light.  Neck:     Musculoskeletal: Normal range of motion and neck supple. Normal range of motion. No neck rigidity, spinous process tenderness or muscular tenderness.  Cardiovascular:     Rate and Rhythm: Regular rhythm. Tachycardia present.     Heart sounds: Normal heart sounds. No murmur.  Pulmonary:     Effort: Pulmonary effort is normal.     Breath sounds: Normal breath sounds. No wheezing or rales.  Abdominal:     General: There is no distension.     Palpations: Abdomen is soft.     Tenderness: There is no abdominal tenderness.  Musculoskeletal:        General: Tenderness present.     Lumbar back: She exhibits decreased range of motion, tenderness and pain. She exhibits no bony tenderness, no swelling, no deformity and normal pulse.  Skin:    General: Skin is warm and dry.     Findings: No rash.  Neurological:     General: No focal deficit present.     Mental Status: She is alert and oriented to person, place, and time. Mental status is at baseline.     Sensory: No sensory deficit.     Motor: No weakness.     Coordination: Coordination normal.     Deep Tendon Reflexes:     Reflex Scores:      Patellar reflexes are 1+ on the right side and 1+ on the left side. Psychiatric:        Mood and Affect: Mood normal.        Behavior: Behavior normal.        Thought Content: Thought content normal.      ED Treatments / Results  Labs (all labs ordered are listed, but only abnormal results are displayed) Labs Reviewed - No data to display  EKG None  Radiology No results found.  Procedures Procedures (including critical care time)  Medications Ordered in ED Medications  ketorolac (TORADOL) 15 MG/ML injection 15 mg (has no administration in time range)  cyclobenzaprine (FLEXERIL) tablet 10 mg (has no administration in time range)     Initial Impression / Assessment and Plan / ED Course  I have reviewed the  triage vital signs and the nursing notes.  Pertinent labs & imaging results that were available during my care of the patient were reviewed by me and considered in my medical decision making (see chart for details).       Pt with gradual onset of back pain suggestive of musculoskeletal back pain with mild radicular sx down the front of the right leg intermittently.  No neurovascular compromise and no incontinence.  Pt  has no infectious sx, hx of CA  or other red flags concerning for pathologic back pain.  Pt is unable to ambulate at thsi moment due to pain.  However, normal strength and reflexes on exam.  Denies trauma. Will give pt pain control and re-eval.  2:25 PM After meds patient is now feeling better and able to sit up and move around.  Discharged home with Flexeril and will continue Aleve and Tylenol.   Final Clinical Impressions(s) / ED Diagnoses   Final diagnoses:  Acute bilateral low back pain without sciatica  Muscle spasm of back    ED Discharge Orders         Ordered    cyclobenzaprine (FLEXERIL) 10 MG tablet  2 times daily PRN     07/10/18 1424           Gwyneth Sprout, MD 07/10/18 1425

## 2018-07-10 NOTE — Discharge Instructions (Signed)
You can take Aleve and Tylenol at the same time every 6 hours for the pain and the muscle relaxer as needed.  Also hot showers or muscle rubs may be helpful.  Avoid lifting or doing a lot of bending or twisting.  You will need to follow-up with your doctor if it does not get better as you may need physical therapy.

## 2018-07-10 NOTE — ED Triage Notes (Signed)
Pt BIB [facility] for lower back pain. EMS reports the pain started yesterday around 1300-1400. EMS advised the pt is CAOx4 and able to lay flat without pain but upon movement is when she has the most trouble.   Pt denies an injury. Pt states she has had issues with her lower back before and was given muscle relaxers that eased the pain. Pt reports taking OTC meds without relief.

## 2018-07-10 NOTE — ED Notes (Signed)
Patient verbalizes understanding of discharge instructions. Opportunity for questioning and answers were provided. Armband removed by staff, pt discharged from ED home via POV with family. 

## 2018-07-18 ENCOUNTER — Other Ambulatory Visit: Payer: Self-pay | Admitting: Family Medicine

## 2018-07-18 DIAGNOSIS — E119 Type 2 diabetes mellitus without complications: Secondary | ICD-10-CM

## 2018-08-26 DIAGNOSIS — L700 Acne vulgaris: Secondary | ICD-10-CM | POA: Diagnosis not present

## 2018-10-13 ENCOUNTER — Other Ambulatory Visit: Payer: Self-pay | Admitting: Family Medicine

## 2018-10-13 DIAGNOSIS — E119 Type 2 diabetes mellitus without complications: Secondary | ICD-10-CM

## 2018-11-03 ENCOUNTER — Encounter: Payer: Self-pay | Admitting: Family Medicine

## 2018-11-10 ENCOUNTER — Ambulatory Visit (INDEPENDENT_AMBULATORY_CARE_PROVIDER_SITE_OTHER): Payer: 59 | Admitting: Family Medicine

## 2018-11-10 ENCOUNTER — Other Ambulatory Visit: Payer: Self-pay

## 2018-11-10 ENCOUNTER — Encounter: Payer: Self-pay | Admitting: Family Medicine

## 2018-11-10 VITALS — BP 132/78 | HR 68 | Temp 97.8°F | Ht 68.0 in | Wt 199.5 lb

## 2018-11-10 DIAGNOSIS — I152 Hypertension secondary to endocrine disorders: Secondary | ICD-10-CM

## 2018-11-10 DIAGNOSIS — Z0001 Encounter for general adult medical examination with abnormal findings: Secondary | ICD-10-CM | POA: Diagnosis not present

## 2018-11-10 DIAGNOSIS — E119 Type 2 diabetes mellitus without complications: Secondary | ICD-10-CM | POA: Diagnosis not present

## 2018-11-10 DIAGNOSIS — Z13220 Encounter for screening for lipoid disorders: Secondary | ICD-10-CM | POA: Diagnosis not present

## 2018-11-10 DIAGNOSIS — I10 Essential (primary) hypertension: Secondary | ICD-10-CM

## 2018-11-10 DIAGNOSIS — E1159 Type 2 diabetes mellitus with other circulatory complications: Secondary | ICD-10-CM | POA: Diagnosis not present

## 2018-11-10 NOTE — Assessment & Plan Note (Signed)
At goal.  Continue amlodipine 10 mg daily.  Check CBC, C met, TSH 

## 2018-11-10 NOTE — Patient Instructions (Signed)
It was very nice to see you today!  Keep up the good work!   We will check blood work and a urine sample today.  We may be able to stop the metformin depending on your A1c.  Take care, Dr Jerline Pain  Please try these tips to maintain a healthy lifestyle:   Eat at least 3 REAL meals and 1-2 snacks per day.  Aim for no more than 5 hours between eating.  If you eat breakfast, please do so within one hour of getting up.    Obtain twice as many fruits/vegetables as protein or carbohydrate foods for both lunch and dinner. (Half of each meal should be fruits/vegetables, one quarter protein, and one quarter starchy carbs)   Cut down on sweet beverages. This includes juice, soda, and sweet tea.    Exercise at least 150 minutes every week.    Preventive Care 35-74 Years Old, Female Preventive care refers to visits with your health care provider and lifestyle choices that can promote health and wellness. This includes:  A yearly physical exam. This may also be called an annual well check.  Regular dental visits and eye exams.  Immunizations.  Screening for certain conditions.  Healthy lifestyle choices, such as eating a healthy diet, getting regular exercise, not using drugs or products that contain nicotine and tobacco, and limiting alcohol use. What can I expect for my preventive care visit? Physical exam Your health care provider will check your:  Height and weight. This may be used to calculate body mass index (BMI), which tells if you are at a healthy weight.  Heart rate and blood pressure.  Skin for abnormal spots. Counseling Your health care provider may ask you questions about your:  Alcohol, tobacco, and drug use.  Emotional well-being.  Home and relationship well-being.  Sexual activity.  Eating habits.  Work and work Statistician.  Method of birth control.  Menstrual cycle.  Pregnancy history. What immunizations do I need?  Influenza (flu) vaccine   This is recommended every year. Tetanus, diphtheria, and pertussis (Tdap) vaccine  You may need a Td booster every 10 years. Varicella (chickenpox) vaccine  You may need this if you have not been vaccinated. Zoster (shingles) vaccine  You may need this after age 28. Measles, mumps, and rubella (MMR) vaccine  You may need at least one dose of MMR if you were born in 1957 or later. You may also need a second dose. Pneumococcal conjugate (PCV13) vaccine  You may need this if you have certain conditions and were not previously vaccinated. Pneumococcal polysaccharide (PPSV23) vaccine  You may need one or two doses if you smoke cigarettes or if you have certain conditions. Meningococcal conjugate (MenACWY) vaccine  You may need this if you have certain conditions. Hepatitis A vaccine  You may need this if you have certain conditions or if you travel or work in places where you may be exposed to hepatitis A. Hepatitis B vaccine  You may need this if you have certain conditions or if you travel or work in places where you may be exposed to hepatitis B. Haemophilus influenzae type b (Hib) vaccine  You may need this if you have certain conditions. Human papillomavirus (HPV) vaccine  If recommended by your health care provider, you may need three doses over 6 months. You may receive vaccines as individual doses or as more than one vaccine together in one shot (combination vaccines). Talk with your health care provider about the risks and benefits of  combination vaccines. What tests do I need? Blood tests  Lipid and cholesterol levels. These may be checked every 5 years, or more frequently if you are over 7 years old.  Hepatitis C test.  Hepatitis B test. Screening  Lung cancer screening. You may have this screening every year starting at age 20 if you have a 30-pack-year history of smoking and currently smoke or have quit within the past 15 years.  Colorectal cancer screening.  All adults should have this screening starting at age 29 and continuing until age 54. Your health care provider may recommend screening at age 74 if you are at increased risk. You will have tests every 1-10 years, depending on your results and the type of screening test.  Diabetes screening. This is done by checking your blood sugar (glucose) after you have not eaten for a while (fasting). You may have this done every 1-3 years.  Mammogram. This may be done every 1-2 years. Talk with your health care provider about when you should start having regular mammograms. This may depend on whether you have a family history of breast cancer.  BRCA-related cancer screening. This may be done if you have a family history of breast, ovarian, tubal, or peritoneal cancers.  Pelvic exam and Pap test. This may be done every 3 years starting at age 12. Starting at age 9, this may be done every 5 years if you have a Pap test in combination with an HPV test. Other tests  Sexually transmitted disease (STD) testing.  Bone density scan. This is done to screen for osteoporosis. You may have this scan if you are at high risk for osteoporosis. Follow these instructions at home: Eating and drinking  Eat a diet that includes fresh fruits and vegetables, whole grains, lean protein, and low-fat dairy.  Take vitamin and mineral supplements as recommended by your health care provider.  Do not drink alcohol if: ? Your health care provider tells you not to drink. ? You are pregnant, may be pregnant, or are planning to become pregnant.  If you drink alcohol: ? Limit how much you have to 0-1 drink a day. ? Be aware of how much alcohol is in your drink. In the [place] one drink equals one 12 oz bottle of beer (355 mL), one 5 oz glass of wine (148 mL), or one 1 oz glass of hard liquor (44 mL). Lifestyle  Take daily care of your teeth and gums.  Stay active. Exercise for at least 30 minutes on 5 or more days each week.   Do not use any products that contain nicotine or tobacco, such as cigarettes, e-cigarettes, and chewing tobacco. If you need help quitting, ask your health care provider.  If you are sexually active, practice safe sex. Use a condom or other form of birth control (contraception) in order to prevent pregnancy and STIs (sexually transmitted infections).  If told by your health care provider, take low-dose aspirin daily starting at age 21. What's next?  Visit your health care provider once a year for a well check visit.  Ask your health care provider how often you should have your eyes and teeth checked.  Stay up to date on all vaccines. This information is not intended to replace advice given to you by your health care provider. Make sure you discuss any questions you have with your health care provider. Document Released: 05/10/2015 Document Revised: 12/23/2017 Document Reviewed: 12/23/2017 Elsevier Patient Education  2020 [employer].

## 2018-11-10 NOTE — Assessment & Plan Note (Signed)
Check A1c.  We will continue metformin 500 mg daily though she is concerned about potential long-term side effects such as dementia.  Reassured patient this is typically not observed however if A1c continues to be well controlled, we can try off metformin.  She is agreeable to this.

## 2018-11-10 NOTE — Progress Notes (Signed)
Chief Complaint:  Savannah Morales is a 53 y.o. female who presents today for her annual comprehensive physical exam.    Assessment/Plan:  Type 2 diabetes mellitus (HCC) Check A1c.  We will continue metformin 500 mg daily though she is concerned about potential long-term side effects such as dementia.  Reassured patient this is typically not observed however if A1c continues to be well controlled, we can try off metformin.  She is agreeable to this.  Hypertension associated with diabetes ([place]) At goal.  Continue amlodipine 10 mg daily.  Check CBC, C met, TSH   Body mass index is 30.33 kg/m. / Obesity BMI Metric Follow Up - 11/10/18 1533      BMI Metric Follow Up-Please document annually   BMI Metric Follow Up  Education provided        Preventative Healthcare: UTD on cancer screenings.   Patient Counseling(The following topics were reviewed and/or handout was given):  -Nutrition: Stressed importance of moderation in sodium/caffeine intake, saturated fat and cholesterol, caloric balance, sufficient intake of fresh fruits, vegetables, and fiber.  -Stressed the importance of regular exercise.   -Substance Abuse: Discussed cessation/primary prevention of tobacco, alcohol, or other drug use; driving or other dangerous activities under the influence; availability of treatment for abuse.   -Injury prevention: Discussed safety belts, safety helmets, smoke detector, smoking near bedding or upholstery.   -Sexuality: Discussed sexually transmitted diseases, partner selection, use of condoms, avoidance of unintended pregnancy and contraceptive alternatives.   -Dental health: Discussed importance of regular tooth brushing, flossing, and dental visits.  -Health maintenance and immunizations reviewed. Please refer to Health maintenance section.  Return to care in 1 year for next preventative visit.     Subjective:  HPI:  She has no acute complaints today.   Her stable, chronic medical  conditions are outlined below:   # T2DM - On metformin 500 mg daily and tolerating well. - ROS: No reported polyuria or polydipsia  # HTN - On amlodipine 66m daily and tolerating well - ROS: No reported chest pain or shortness of breath.   Lifestyle Diet: Tries to eat plenty of fruits and vegetables, though has some dietary indiscretions  Exercise: Limited due to COVID. Works out at home.   Depression screen PHQ 2/9 04/14/2017  Decreased Interest 0  Down, Depressed, Hopeless 0  PHQ - 2 Score 0    Health Maintenance Due  Topic Date Due  . HIV Screening  10/25/1982  . OPHTHALMOLOGY EXAM  09/28/2017  . URINE MICROALBUMIN  10/13/2017  . HEMOGLOBIN A1C  05/11/2018  . FOOT EXAM  11/09/2018     ROS: Per HPI, otherwise a complete review of systems was negative.   PMH:  The following were reviewed and entered/updated in epic: Past Medical History:  Diagnosis Date  . Diabetes mellitus without complication ([place]    Type II  . History of chicken pox   . Hypertension   . Kidney stones    Patient Active Problem List   Diagnosis Date Noted  . [place] 2 diabetes mellitus ([place] 04/21/2016  . Hypertension associated with diabetes ([facility] 03/17/2016  . H/O urinary stone 03/17/2016   Past Surgical History:  Procedure Laterality Date  . ANKLE SURGERY      Family History  Problem Relation Age of Onset  . Hyperlipidemia Mother   . Hypertension Father   . Diabetes Father   . Cancer Paternal Grandmother     Medications- reviewed and updated Current Outpatient Medications  Medication Sig Dispense  Refill  . amLODipine (NORVASC) 10 MG tablet TAKE 1 TABLET BY MOUTH DAILY 90 tablet 3  . Garlic 7530 MG CAPS Take by mouth.    . metFORMIN (GLUCOPHAGE-XR) 500 MG 24 hr tablet TAKE 1 TABLET BY MOUTH DAILY WITH BREAKFAST 90 tablet 0  . Omega-3 Fatty Acids (FISH OIL) 1200 MG CPDR Take by mouth.     No current facility-administered medications for this visit.     Allergies-reviewed and  updated No Known Allergies  Social History   Socioeconomic History  . Marital status: Divorced    Spouse name: Not on file  . Number of children: 3  . Years of education: 77  . Highest education level: Not on file  Occupational History  . Occupation: Engineer, petroleum   Social Needs  . Financial resource strain: Not on file  . Food insecurity    Worry: Not on file    Inability: Not on file  . Transportation needs    Medical: Not on file    Non-medical: Not on file  Tobacco Use  . Smoking status: Never Smoker  . Smokeless tobacco: Never Used  Substance and Sexual Activity  . Alcohol use: Yes    Comment: Rarely  . Drug use: No  . Sexual activity: Not Currently  Lifestyle  . Physical activity    Days per week: Not on file    Minutes per session: Not on file  . Stress: Not on file  Relationships  . Social Herbalist on phone: Not on file    Gets together: Not on file    Attends religious service: Not on file    Active member of club or organization: Not on file    Attends meetings of clubs or organizations: Not on file    Relationship status: Not on file  Other Topics Concern  . Not on file  Social History Narrative  . Not on file        Objective:  Physical Exam: BP 132/78 (BP Location: Left Arm, Patient Position: Sitting, Cuff Size: Large)   Pulse 68   Temp 97.8 F (36.6 C) (Oral)   Ht '5\' 8"'  (1.727 m)   Wt 199 lb 8 oz (90.5 kg)   LMP 03/26/2016   SpO2 97%   BMI 30.33 kg/m   Body mass index is 30.33 kg/m. Wt Readings from Last 3 Encounters:  11/10/18 199 lb 8 oz (90.5 kg)  07/10/18 180 lb (81.6 kg)  01/06/18 191 lb 12.8 oz (87 kg)   Gen: NAD, resting comfortably HEENT: TMs normal bilaterally. OP clear. No thyromegaly noted.  CV: RRR with no murmurs appreciated Pulm: NWOB, CTAB with no crackles, wheezes, or rhonchi GI: Normal bowel sounds present. Soft, Nontender, Nondistended. MSK: no edema, cyanosis, or clubbing noted Skin:  warm, dry Neuro: CN2-12 grossly intact. Strength 5/5 in upper and lower extremities. Reflexes symmetric and intact bilaterally.  Psych: Normal affect and thought content     Megumi Treaster M. Jerline Pain, MD 11/10/2018 3:34 PM

## 2018-11-11 LAB — COMPREHENSIVE METABOLIC PANEL
ALT: 11 U/L (ref 0–35)
AST: 12 U/L (ref 0–37)
Albumin: 4.1 g/dL (ref 3.5–5.2)
Alkaline Phosphatase: 55 U/L (ref 39–117)
BUN: 7 mg/dL (ref 6–23)
CO2: 24 mEq/L (ref 19–32)
Calcium: 8.8 mg/dL (ref 8.4–10.5)
Chloride: 105 mEq/L (ref 96–112)
Creatinine, Ser: 0.66 mg/dL (ref 0.40–1.20)
GFR: 114.22 mL/min (ref >60.00)
Glucose, Bld: 129 mg/dL — ABNORMAL HIGH (ref 70–99)
Potassium: 3.7 mEq/L (ref 3.5–5.1)
Sodium: 137 mEq/L (ref 135–145)
Total Bilirubin: 0.2 mg/dL (ref 0.2–1.2)
Total Protein: 7.3 g/dL (ref 6.0–8.3)

## 2018-11-11 LAB — LIPID PANEL
Cholesterol: 135 mg/dL (ref 0–200)
HDL: 56.9 mg/dL (ref >39.00)
LDL Cholesterol: 66 mg/dL (ref 0–99)
NonHDL: 78.44
Total CHOL/HDL Ratio: 2
Triglycerides: 61 mg/dL (ref 0.0–149.0)
VLDL: 12.2 mg/dL (ref 0.0–40.0)

## 2018-11-11 LAB — CBC
HCT: 38.2 % (ref 36.0–46.0)
Hemoglobin: 12.2 g/dL (ref 12.0–15.0)
MCHC: 31.9 g/dL (ref 30.0–36.0)
MCV: 82.2 fl (ref 78.0–100.0)
Platelets: 239 10*3/uL (ref 150.0–400.0)
RBC: 4.65 Mil/uL (ref 3.87–5.11)
RDW: 14.9 % (ref 11.5–15.5)
WBC: 5.7 10*3/uL (ref 4.0–10.5)

## 2018-11-11 LAB — TSH: TSH: 0.49 u[IU]/mL (ref 0.35–4.50)

## 2018-11-11 LAB — HEMOGLOBIN A1C: Hgb A1c MFr Bld: 6.8 % — ABNORMAL HIGH (ref 4.6–6.5)

## 2018-11-11 LAB — MICROALBUMIN / CREATININE URINE RATIO
Creatinine,U: 155.1 mg/dL
Microalb Creat Ratio: 1.2 mg/g (ref 0.0–30.0)
Microalb, Ur: 1.9 mg/dL (ref 0.0–1.9)

## 2018-11-14 NOTE — Progress Notes (Signed)
Please inform patient of the following:  HEr blood sugar is up slightly but everything else looks great. We can keep going with her current dose of metformin if she wants or we can switch it for [place] 100mg  daily if she would prefer to do that. Do not want to take her off meds completely due to increase blood sugar. Regardless, would like for her to come back so we can recheck in 3-6 months.  Algis Greenhouse. Jerline Pain, MD 11/14/2018 10:55 PM

## 2019-02-06 ENCOUNTER — Other Ambulatory Visit: Payer: Self-pay | Admitting: Family Medicine

## 2019-02-06 DIAGNOSIS — E119 Type 2 diabetes mellitus without complications: Secondary | ICD-10-CM

## 2019-02-06 NOTE — Telephone Encounter (Signed)
See note

## 2019-02-06 NOTE — Telephone Encounter (Signed)
Miss Clois Comber from Pharmacy called to confirm that refill request was received.

## 2019-04-28 ENCOUNTER — Other Ambulatory Visit: Payer: Self-pay | Admitting: Family Medicine

## 2019-04-28 DIAGNOSIS — E119 Type 2 diabetes mellitus without complications: Secondary | ICD-10-CM

## 2019-05-04 ENCOUNTER — Other Ambulatory Visit: Payer: Self-pay | Admitting: Family Medicine

## 2019-05-04 DIAGNOSIS — I10 Essential (primary) hypertension: Secondary | ICD-10-CM

## 2019-07-20 ENCOUNTER — Other Ambulatory Visit: Payer: Self-pay | Admitting: Family Medicine

## 2019-07-20 DIAGNOSIS — E119 Type 2 diabetes mellitus without complications: Secondary | ICD-10-CM

## 2019-10-17 LAB — HM DIABETES EYE EXAM

## 2019-11-13 ENCOUNTER — Encounter: Payer: Self-pay | Admitting: Family Medicine

## 2019-11-13 ENCOUNTER — Ambulatory Visit (INDEPENDENT_AMBULATORY_CARE_PROVIDER_SITE_OTHER): Payer: 59 | Admitting: Family Medicine

## 2019-11-13 ENCOUNTER — Other Ambulatory Visit: Payer: Self-pay

## 2019-11-13 VITALS — BP 128/70 | HR 60 | Temp 97.3°F | Ht 68.0 in | Wt 193.2 lb

## 2019-11-13 DIAGNOSIS — E1159 Type 2 diabetes mellitus with other circulatory complications: Secondary | ICD-10-CM | POA: Diagnosis not present

## 2019-11-13 DIAGNOSIS — E119 Type 2 diabetes mellitus without complications: Secondary | ICD-10-CM | POA: Diagnosis not present

## 2019-11-13 DIAGNOSIS — I10 Essential (primary) hypertension: Secondary | ICD-10-CM | POA: Diagnosis not present

## 2019-11-13 DIAGNOSIS — Z13220 Encounter for screening for lipoid disorders: Secondary | ICD-10-CM

## 2019-11-13 DIAGNOSIS — Z0001 Encounter for general adult medical examination with abnormal findings: Secondary | ICD-10-CM

## 2019-11-13 DIAGNOSIS — I152 Hypertension secondary to endocrine disorders: Secondary | ICD-10-CM

## 2019-11-13 NOTE — Progress Notes (Signed)
Chief Complaint:  Savannah Morales is a 54 y.o. female who presents today for her annual comprehensive physical exam.    Assessment/Plan:  Chronic Problems Addressed Today: Type 2 diabetes mellitus (HCC) Check A1c.  Continue Metformin 500 mg daily.  Check urine microalbumin as well.  Hypertension associated with diabetes (HCC) At goal.  Continue amlodipine 10 mg daily.  Check CBC, CMET, TSH.   Body mass index is 29.38 kg/m. / Overweight  BMI Metric Follow Up - 11/13/19 1529      BMI Metric Follow Up-Please document annually   BMI Metric Follow Up Education provided           Preventative Healthcare: Check CBC, CMET, TSH, lipid panel.  Has had mammogram and Pap smear done recently.  Will get records in her chart.  Also had Covid vaccine.  Will schedule some shortness well.  Patient Counseling(The following topics were reviewed and/or handout was given):  -Nutrition: Stressed importance of moderation in sodium/caffeine intake, saturated fat and cholesterol, caloric balance, sufficient intake of fresh fruits, vegetables, and fiber.  -Stressed the importance of regular exercise.   -Substance Abuse: Discussed cessation/primary prevention of tobacco, alcohol, or other drug use; driving or other dangerous activities under the influence; availability of treatment for abuse.   -Injury prevention: Discussed safety belts, safety helmets, smoke detector, smoking near bedding or upholstery.   -Sexuality: Discussed sexually transmitted diseases, partner selection, use of condoms, avoidance of unintended pregnancy and contraceptive alternatives.   -Dental health: Discussed importance of regular tooth brushing, flossing, and dental visits.  -Health maintenance and immunizations reviewed. Please refer to Health maintenance section.  Return to care in 1 year for next preventative visit.     Subjective:  HPI:  She has no acute complaints today.   Lifestyle Diet: Plenty of fruits and  vegetables.  Exercise: Walks dog three times per day.   Depression screen PHQ 2/9 11/13/2019  Decreased Interest 0  Down, Depressed, Hopeless 0  PHQ - 2 Score 0    Health Maintenance Due  Topic Date Due  . Hepatitis C Screening  Never done  . COVID-19 Vaccine (1) Never done  . HIV Screening  Never done  . HEMOGLOBIN A1C  05/13/2019  . FOOT EXAM  11/10/2019  . URINE MICROALBUMIN  11/10/2019     ROS: Per HPI, otherwise a complete review of systems was negative.   PMH:  The following were reviewed and entered/updated in epic: Past Medical History:  Diagnosis Date  . Diabetes mellitus without complication (HCC)    Type II  . History of chicken pox   . Hypertension   . Kidney stones    Patient Active Problem List   Diagnosis Date Noted  . Type 2 diabetes mellitus (HCC) 04/21/2016  . Hypertension associated with diabetes (HCC) 03/17/2016  . H/O urinary stone 03/17/2016   Past Surgical History:  Procedure Laterality Date  . ANKLE SURGERY      Family History  Problem Relation Age of Onset  . Hyperlipidemia Mother   . Hypertension Father   . Diabetes Father   . Cancer Paternal Grandmother     Medications- reviewed and updated Current Outpatient Medications  Medication Sig Dispense Refill  . amLODipine (NORVASC) 10 MG tablet Take 1 tablet (10 mg total) by mouth daily. 90 tablet 2  . Garlic 1000 MG CAPS Take by mouth.    . metFORMIN (GLUCOPHAGE-XR) 500 MG 24 hr tablet TAKE 1 TABLET BY MOUTH DAILY WITH BREAKFAST 60 tablet 2  .  Omega-3 Fatty Acids (FISH OIL) 1200 MG CPDR Take by mouth.     No current facility-administered medications for this visit.    Allergies-reviewed and updated No Known Allergies  Social History   Socioeconomic History  . Marital status: Divorced    Spouse name: Not on file  . Number of children: 3  . Years of education: 82  . Highest education level: Not on file  Occupational History  . Occupation: Actor     Tobacco Use  . Smoking status: Never Smoker  . Smokeless tobacco: Never Used  Vaping Use  . Vaping Use: Never used  Substance and Sexual Activity  . Alcohol use: Yes    Comment: Rarely  . Drug use: No  . Sexual activity: Not Currently  Other Topics Concern  . Not on file  Social History Narrative  . Not on file   Social Determinants of Health   Financial Resource Strain:   . Difficulty of Paying Living Expenses:   Food Insecurity:   . Worried About Programme researcher, broadcasting/film/video in the Last Year:   . Barista in the Last Year:   Transportation Needs:   . Freight forwarder (Medical):   [name] Lack of Transportation (Non-Medical):   Physical Activity:   . Days of Exercise per Week:   . Minutes of Exercise per Session:   Stress:   . Feeling of Stress :   Social Connections:   . Frequency of Communication with Friends and Family:   . Frequency of Social Gatherings with Friends and Family:   . Attends Religious Services:   . Active Member of Clubs or Organizations:   . Attends Banker Meetings:   [name] Marital Status:         Objective:  Physical Exam: BP 128/70   Pulse 60   Temp (!) 97.3 F (36.3 C)   Ht 5\' 8"  (1.727 m)   Wt 193 lb 3.2 oz (87.6 kg)   LMP 03/26/2016   SpO2 95%   BMI 29.38 kg/m   Body mass index is 29.38 kg/m. Wt Readings from Last 3 Encounters:  11/13/19 193 lb 3.2 oz (87.6 kg)  11/10/18 199 lb 8 oz (90.5 kg)  07/10/18 180 lb (81.6 kg)   Gen: NAD, resting comfortably HEENT: TMs normal bilaterally. OP clear. No thyromegaly noted.  CV: RRR with no murmurs appreciated Pulm: NWOB, CTAB with no crackles, wheezes, or rhonchi GI: Normal bowel sounds present. Soft, Nontender, Nondistended. MSK: no edema, cyanosis, or clubbing noted Skin: warm, dry Neuro: CN2-12 grossly intact. Strength 5/5 in upper and lower extremities. Reflexes symmetric and intact bilaterally.  Psych: Normal affect and thought content     Savannah Morales M. 07/12/18,  MD 11/13/2019 3:31 PM

## 2019-11-13 NOTE — Assessment & Plan Note (Signed)
Check A1c.  Continue Metformin 500 mg daily.  Check urine microalbumin as well.

## 2019-11-13 NOTE — Patient Instructions (Signed)
It was very nice to see you today!  We will check blood work today.  Keep up the good work!  We will see you back in a year for your next physical. Come back sooner if needed.   Take care, Dr Jerline Pain  Please try these tips to maintain a healthy lifestyle:   Eat at least 3 REAL meals and 1-2 snacks per day.  Aim for no more than 5 hours between eating.  If you eat breakfast, please do so within one hour of getting up.    Each meal should contain half fruits/vegetables, one quarter protein, and one quarter carbs (no bigger than a computer mouse)   Cut down on sweet beverages. This includes juice, soda, and sweet tea.     Drink at least 1 glass of water with each meal and aim for at least 8 glasses per day   Exercise at least 150 minutes every week.    Preventive Care 13-36 Years Old, Female Preventive care refers to visits with your health care provider and lifestyle choices that can promote health and wellness. This includes:  A yearly physical exam. This may also be called an annual well check.  Regular dental visits and eye exams.  Immunizations.  Screening for certain conditions.  Healthy lifestyle choices, such as eating a healthy diet, getting regular exercise, not using drugs or products that contain nicotine and tobacco, and limiting alcohol use. What can I expect for my preventive care visit? Physical exam Your health care provider will check your:  Height and weight. This may be used to calculate body mass index (BMI), which tells if you are at a healthy weight.  Heart rate and blood pressure.  Skin for abnormal spots. Counseling Your health care provider may ask you questions about your:  Alcohol, tobacco, and drug use.  Emotional well-being.  Home and relationship well-being.  Sexual activity.  Eating habits.  Work and work Statistician.  Method of birth control.  Menstrual cycle.  Pregnancy history. What immunizations do I  need?  Influenza (flu) vaccine  This is recommended every year. Tetanus, diphtheria, and pertussis (Tdap) vaccine  You may need a Td booster every 10 years. Varicella (chickenpox) vaccine  You may need this if you have not been vaccinated. Zoster (shingles) vaccine  You may need this after age 54. Measles, mumps, and rubella (MMR) vaccine  You may need at least one dose of MMR if you were born in 1957 or later. You may also need a second dose. Pneumococcal conjugate (PCV13) vaccine  You may need this if you have certain conditions and were not previously vaccinated. Pneumococcal polysaccharide (PPSV23) vaccine  You may need one or two doses if you smoke cigarettes or if you have certain conditions. Meningococcal conjugate (MenACWY) vaccine  You may need this if you have certain conditions. Hepatitis A vaccine  You may need this if you have certain conditions or if you travel or work in places where you may be exposed to hepatitis A. Hepatitis B vaccine  You may need this if you have certain conditions or if you travel or work in places where you may be exposed to hepatitis B. Haemophilus influenzae type b (Hib) vaccine  You may need this if you have certain conditions. Human papillomavirus (HPV) vaccine  If recommended by your health care provider, you may need three doses over 6 months. You may receive vaccines as individual doses or as more than one vaccine together in one shot (combination vaccines).  Talk with your health care provider about the risks and benefits of combination vaccines. What tests do I need? Blood tests  Lipid and cholesterol levels. These may be checked every 5 years, or more frequently if you are over 48 years old.  Hepatitis C test.  Hepatitis B test. Screening  Lung cancer screening. You may have this screening every year starting at age 37 if you have a 30-pack-year history of smoking and currently smoke or have quit within the past 15  years.  Colorectal cancer screening. All adults should have this screening starting at age 41 and continuing until age 30. Your health care provider may recommend screening at age 37 if you are at increased risk. You will have tests every 1-10 years, depending on your results and the type of screening test.  Diabetes screening. This is done by checking your blood sugar (glucose) after you have not eaten for a while (fasting). You may have this done every 1-3 years.  Mammogram. This may be done every 1-2 years. Talk with your health care provider about when you should start having regular mammograms. This may depend on whether you have a family history of breast cancer.  BRCA-related cancer screening. This may be done if you have a family history of breast, ovarian, tubal, or peritoneal cancers.  Pelvic exam and Pap test. This may be done every 3 years starting at age 52. Starting at age 47, this may be done every 5 years if you have a Pap test in combination with an HPV test. Other tests  Sexually transmitted disease (STD) testing.  Bone density scan. This is done to screen for osteoporosis. You may have this scan if you are at high risk for osteoporosis. Follow these instructions at home: Eating and drinking  Eat a diet that includes fresh fruits and vegetables, whole grains, lean protein, and low-fat dairy.  Take vitamin and mineral supplements as recommended by your health care provider.  Do not drink alcohol if: ? Your health care provider tells you not to drink. ? You are pregnant, may be pregnant, or are planning to become pregnant.  If you drink alcohol: ? Limit how much you have to 0-1 drink a day. ? Be aware of how much alcohol is in your drink. In the [place] one drink equals one 12 oz bottle of beer (355 mL), one 5 oz glass of wine (148 mL), or one 1 oz glass of hard liquor (44 mL). Lifestyle  Take daily care of your teeth and gums.  Stay active. Exercise for at least 30  minutes on 5 or more days each week.  Do not use any products that contain nicotine or tobacco, such as cigarettes, e-cigarettes, and chewing tobacco. If you need help quitting, ask your health care provider.  If you are sexually active, practice safe sex. Use a condom or other form of birth control (contraception) in order to prevent pregnancy and STIs (sexually transmitted infections).  If told by your health care provider, take low-dose aspirin daily starting at age 92. What's next?  Visit your health care provider once a year for a well check visit.  Ask your health care provider how often you should have your eyes and teeth checked.  Stay up to date on all vaccines. This information is not intended to replace advice given to you by your health care provider. Make sure you discuss any questions you have with your health care provider. Document Revised: 12/23/2017 Document Reviewed: 12/23/2017 Elsevier Patient  Education  2020 Elsevier Inc.  

## 2019-11-13 NOTE — Assessment & Plan Note (Signed)
At goal.  Continue amlodipine 10 mg daily.  Check CBC, CMET, TSH.

## 2019-11-14 LAB — CBC
HCT: 40.8 % (ref 36.0–46.0)
Hemoglobin: 13.2 g/dL (ref 12.0–15.0)
MCHC: 32.3 g/dL (ref 30.0–36.0)
MCV: 81.9 fl (ref 78.0–100.0)
Platelets: 224 10*3/uL (ref 150.0–400.0)
RBC: 4.98 Mil/uL (ref 3.87–5.11)
RDW: 15 % (ref 11.5–15.5)
WBC: 5.9 10*3/uL (ref 4.0–10.5)

## 2019-11-14 LAB — COMPREHENSIVE METABOLIC PANEL
ALT: 19 U/L (ref 0–35)
AST: 17 U/L (ref 0–37)
Albumin: 4.4 g/dL (ref 3.5–5.2)
Alkaline Phosphatase: 49 U/L (ref 39–117)
BUN: 14 mg/dL (ref 6–23)
CO2: 23 mEq/L (ref 19–32)
Calcium: 10 mg/dL (ref 8.4–10.5)
Chloride: 103 mEq/L (ref 96–112)
Creatinine, Ser: 0.8 mg/dL (ref 0.40–1.20)
GFR: 91.12 mL/min (ref >60.00)
Glucose, Bld: 82 mg/dL (ref 70–99)
Potassium: 3.7 mEq/L (ref 3.5–5.1)
Sodium: 138 mEq/L (ref 135–145)
Total Bilirubin: 0.5 mg/dL (ref 0.2–1.2)
Total Protein: 7.4 g/dL (ref 6.0–8.3)

## 2019-11-14 LAB — MICROALBUMIN / CREATININE URINE RATIO
Creatinine,U: 577.4 mg/dL
Microalb Creat Ratio: 2 mg/g (ref 0.0–30.0)
Microalb, Ur: 11.7 mg/dL — ABNORMAL HIGH (ref 0.0–1.9)

## 2019-11-14 LAB — LIPID PANEL
Cholesterol: 143 mg/dL (ref 0–200)
HDL: 55.4 mg/dL (ref >39.00)
LDL Cholesterol: 69 mg/dL (ref 0–99)
NonHDL: 87.94
Total CHOL/HDL Ratio: 3
Triglycerides: 97 mg/dL (ref 0.0–149.0)
VLDL: 19.4 mg/dL (ref 0.0–40.0)

## 2019-11-14 LAB — TSH: TSH: 1.1 u[IU]/mL (ref 0.35–4.50)

## 2019-11-14 LAB — HEMOGLOBIN A1C: Hgb A1c MFr Bld: 6.8 % — ABNORMAL HIGH (ref 4.6–6.5)

## 2019-11-16 NOTE — Progress Notes (Signed)
Please inform patient of the following:  Blood work is all STABLE. Do not need to make any changes to her treatment plan at this time. Would like for her to come back in 6 months to recheck blood sugar.  Savannah Morales. [name], MD 11/16/2019 12:49 PM

## 2019-11-24 ENCOUNTER — Other Ambulatory Visit: Payer: Self-pay

## 2019-11-24 ENCOUNTER — Encounter: Payer: Self-pay | Admitting: Internal Medicine

## 2019-11-24 ENCOUNTER — Ambulatory Visit (INDEPENDENT_AMBULATORY_CARE_PROVIDER_SITE_OTHER): Payer: 59 | Admitting: Internal Medicine

## 2019-11-24 VITALS — Ht 68.0 in | Wt 193.0 lb

## 2019-11-24 DIAGNOSIS — R053 Chronic cough: Secondary | ICD-10-CM

## 2019-11-24 DIAGNOSIS — R05 Cough: Secondary | ICD-10-CM

## 2019-11-24 MED ORDER — BENZONATATE 100 MG PO CAPS: 100.0000 mg | ORAL_CAPSULE | Freq: Three times a day (TID) | ORAL | 0 refills | Status: DC | PRN

## 2019-11-24 MED ORDER — PROMETHAZINE-DM 6.25-15 MG/5ML PO SYRP: 5.0000 mL | ORAL_SOLUTION | Freq: Four times a day (QID) | ORAL | 0 refills | Status: DC | PRN

## 2019-11-24 NOTE — Progress Notes (Signed)
Virtual Visit via Video Note  I connected with@ on 11/24/19 at  2:30 PM EDT by a video enabled telemedicine application and verified that I am speaking with the correct person using two identifiers. Location patient: home Location provider:work  office Persons participating in the virtual visit: patient, provider  WIth national recommendations  regarding COVID 19 pandemic   video visit is advised over in office visit for this patient.  Patient aware  of the limitations of evaluation and management by telemedicine and  availability of in person appointments. and agreed to proceed.   HPI: Savannah Morales presents for video visit   Difficult connecting but eventually connected  PCP NA  Had covid vaccine in May  Had cpx    11 days ago  Had cough then had clear lungs  Coughing  Just  got [place] from [place] grand kids no one sick  ( drove)  Ongoing cough uses nyquil early  Some help  but now coughing a lot  Dry non productive and no fever cps taste or smell changes .  No hx asthma tobacco  resp disease Is around children around but they are not sick.  Felt sob going up many stairs multiple at her daughters   So  not regular so not sure if new  And not now.    ROS: See pertinent positives and negatives per HPI.  Past Medical History:  Diagnosis Date  . Diabetes mellitus without complication (HCC)    Type II  . History of chicken pox   . Hypertension   . Kidney stones     Past Surgical History:  Procedure Laterality Date  . ANKLE SURGERY      Family History  Problem Relation Age of Onset  . Hyperlipidemia Mother   . Hypertension Father   . Diabetes Father   . Cancer Paternal Grandmother     Social History   Tobacco Use  . Smoking status: Never Smoker  . Smokeless tobacco: Never Used  Vaping Use  . Vaping Use: Never used  Substance Use Topics  . Alcohol use: Yes    Comment: Rarely  . Drug use: No      Current Outpatient Medications:  .  amLODipine  (NORVASC) 10 MG tablet, Take 1 tablet (10 mg total) by mouth daily., Disp: 90 tablet, Rfl: 2 .  benzoyl peroxide-erythromycin (BENZAMYCIN) gel, Apply topically every morning., Disp: , Rfl:  .  Garlic 1000 MG CAPS, Take by mouth., Disp: , Rfl:  .  metFORMIN (GLUCOPHAGE-XR) 500 MG 24 hr tablet, TAKE 1 TABLET BY MOUTH DAILY WITH BREAKFAST, Disp: 60 tablet, Rfl: 2 .  Omega-3 Fatty Acids (FISH OIL) 1200 MG CPDR, Take by mouth., Disp: , Rfl:  .  benzonatate (TESSALON) 100 MG capsule, Take 1 capsule (100 mg total) by mouth 3 (three) times daily as needed for cough., Disp: 21 capsule, Rfl: 0 .  Levonorg-Eth Estrad Triphasic 50-30/75-40/ 125-30 MCG TABS, Take 1 tablet by mouth daily. (Patient not taking: Reported on 11/24/2019), Disp: , Rfl:  .  promethazine-dextromethorphan (PROMETHAZINE-DM) 6.25-15 MG/5ML syrup, Take 5 mLs by mouth every 6 (six) hours as needed for cough (at night)., Disp: 118 mL, Rfl: 0  EXAM: BP Readings from Last 3 Encounters:  11/13/19 128/70  11/10/18 132/78  07/10/18 113/80    VITALS per patient if applicable:  GENERAL: alert, oriented, appears well and in no acute distress ocass cough  Bronchial sounding wheezy like   Looks well non toxic and good  color  HEENT: atraumatic, conjunttiva clear, no obvious abnormalities on inspection of external nose and ears NECK: normal movements of the head and neck LUNGS: on inspection no signs of respiratory distress, breathing rate appears normal, no obvious gross SOB, gasping or wheezing CV: no obvious cyanosis MS: moves all visible extremities without noticeable abnormality PSYCH/NEURO: pleasant and cooperative, no obvious depression or anxiety, speech and thought processing grossly intact Lab Results  Component Value Date   WBC 5.9 11/13/2019   HGB 13.2 11/13/2019   HCT 40.8 11/13/2019   PLT 224.0 11/13/2019   GLUCOSE 82 11/13/2019   CHOL 143 11/13/2019   TRIG 97.0 11/13/2019   HDL 55.40 11/13/2019   LDLCALC 69 11/13/2019    ALT 19 11/13/2019   AST 17 11/13/2019   NA 138 11/13/2019   K 3.7 11/13/2019   CL 103 11/13/2019   CREATININE 0.80 11/13/2019   BUN 14 11/13/2019   CO2 23 11/13/2019   TSH 1.10 11/13/2019   HGBA1C 6.8 (H) 11/13/2019   MICROALBUR 11.7 (H) 11/13/2019    ASSESSMENT AND PLAN:  Discussed the following assessment and plan:    ICD-10-CM   1. Cough, persistent  R05    probably  viral no sx pna or bacterial cause   Immunized for covid could still get tested but poss rsv like  Presentation ( in  Community)   At t his time  Supportive care still   Options for cough relief at night    Expect to improve next week   ( but will still be coughing)  Seek emergent care  Counseled.   Expectant management and discussion of plan and treatment with opportunity to ask questions and all were answered. The patient agreed with the plan and demonstrated an understanding of the instructions.   Advised to call back or seek an in-person evaluation if worsening  or having  further concerns . Return if symptoms worsen or fail to improve as expected.  Plan fu with PCP contact if not  Improving in another week although willl still be coughing  No sx of pna at this time    Berniece Andreas, MD

## 2019-12-13 ENCOUNTER — Encounter: Payer: Self-pay | Admitting: Family Medicine

## 2019-12-23 ENCOUNTER — Emergency Department (HOSPITAL_COMMUNITY): Payer: 59

## 2019-12-23 ENCOUNTER — Ambulatory Visit (HOSPITAL_COMMUNITY)
Admission: EM | Admit: 2019-12-23 | Discharge: 2019-12-23 | Disposition: A | Discharge status: Home / Self Care | Payer: 59

## 2019-12-23 ENCOUNTER — Inpatient Hospital Stay (HOSPITAL_COMMUNITY)
Admission: EM | Admit: 2019-12-23 | Discharge: 2019-12-27 | DRG: 292 | Disposition: A | Discharge status: Home / Self Care | Payer: 59 | Attending: Family Medicine | Admitting: Family Medicine

## 2019-12-23 ENCOUNTER — Other Ambulatory Visit: Payer: Self-pay

## 2019-12-23 ENCOUNTER — Encounter (HOSPITAL_COMMUNITY): Payer: Self-pay

## 2019-12-23 ENCOUNTER — Encounter (HOSPITAL_COMMUNITY): Payer: Self-pay | Admitting: *Deleted

## 2019-12-23 DIAGNOSIS — R0609 Other forms of dyspnea: Secondary | ICD-10-CM

## 2019-12-23 DIAGNOSIS — R7989 Other specified abnormal findings of blood chemistry: Secondary | ICD-10-CM

## 2019-12-23 DIAGNOSIS — I493 Ventricular premature depolarization: Secondary | ICD-10-CM | POA: Diagnosis not present

## 2019-12-23 DIAGNOSIS — E0590 Thyrotoxicosis, unspecified without thyrotoxic crisis or storm: Secondary | ICD-10-CM | POA: Diagnosis present

## 2019-12-23 DIAGNOSIS — Z8249 Family history of ischemic heart disease and other diseases of the circulatory system: Secondary | ICD-10-CM

## 2019-12-23 DIAGNOSIS — R778 Other specified abnormalities of plasma proteins: Secondary | ICD-10-CM

## 2019-12-23 DIAGNOSIS — I2510 Atherosclerotic heart disease of native coronary artery without angina pectoris: Secondary | ICD-10-CM | POA: Diagnosis present

## 2019-12-23 DIAGNOSIS — Z79899 Other long term (current) drug therapy: Secondary | ICD-10-CM

## 2019-12-23 DIAGNOSIS — Z809 Family history of malignant neoplasm, unspecified: Secondary | ICD-10-CM

## 2019-12-23 DIAGNOSIS — I472 Ventricular tachycardia: Secondary | ICD-10-CM | POA: Diagnosis present

## 2019-12-23 DIAGNOSIS — Z83438 Family history of other disorder of lipoprotein metabolism and other lipidemia: Secondary | ICD-10-CM

## 2019-12-23 DIAGNOSIS — I152 Hypertension secondary to endocrine disorders: Secondary | ICD-10-CM | POA: Diagnosis present

## 2019-12-23 DIAGNOSIS — Z833 Family history of diabetes mellitus: Secondary | ICD-10-CM

## 2019-12-23 DIAGNOSIS — E119 Type 2 diabetes mellitus without complications: Secondary | ICD-10-CM

## 2019-12-23 DIAGNOSIS — R0602 Shortness of breath: Secondary | ICD-10-CM

## 2019-12-23 DIAGNOSIS — Z87442 Personal history of urinary calculi: Secondary | ICD-10-CM

## 2019-12-23 DIAGNOSIS — R0600 Dyspnea, unspecified: Secondary | ICD-10-CM

## 2019-12-23 DIAGNOSIS — I5041 Acute combined systolic (congestive) and diastolic (congestive) heart failure: Secondary | ICD-10-CM | POA: Diagnosis not present

## 2019-12-23 DIAGNOSIS — Z7984 Long term (current) use of oral hypoglycemic drugs: Secondary | ICD-10-CM

## 2019-12-23 DIAGNOSIS — I420 Dilated cardiomyopathy: Secondary | ICD-10-CM | POA: Diagnosis present

## 2019-12-23 DIAGNOSIS — Z20822 Contact with and (suspected) exposure to covid-19: Secondary | ICD-10-CM | POA: Diagnosis present

## 2019-12-23 DIAGNOSIS — E1159 Type 2 diabetes mellitus with other circulatory complications: Secondary | ICD-10-CM | POA: Diagnosis present

## 2019-12-23 DIAGNOSIS — I2781 Cor pulmonale (chronic): Secondary | ICD-10-CM | POA: Diagnosis present

## 2019-12-23 DIAGNOSIS — I509 Heart failure, unspecified: Secondary | ICD-10-CM

## 2019-12-23 DIAGNOSIS — I272 Pulmonary hypertension, unspecified: Secondary | ICD-10-CM

## 2019-12-23 DIAGNOSIS — E1169 Type 2 diabetes mellitus with other specified complication: Secondary | ICD-10-CM | POA: Diagnosis present

## 2019-12-23 DIAGNOSIS — I2729 Other secondary pulmonary hypertension: Secondary | ICD-10-CM | POA: Diagnosis present

## 2019-12-23 LAB — COMPREHENSIVE METABOLIC PANEL
ALT: 49 U/L — ABNORMAL HIGH (ref 0–44)
AST: 43 U/L — ABNORMAL HIGH (ref 15–41)
Albumin: 3.7 g/dL (ref 3.5–5.0)
Alkaline Phosphatase: 45 U/L (ref 38–126)
Anion gap: 10 (ref 5–15)
BUN: 10 mg/dL (ref 6–20)
CO2: 22 mmol/L (ref 22–32)
Calcium: 8.8 mg/dL — ABNORMAL LOW (ref 8.9–10.3)
Chloride: 108 mmol/L (ref 98–111)
Creatinine, Ser: 0.85 mg/dL (ref 0.44–1.00)
GFR calc Af Amer: >60 mL/min (ref >60)
GFR calc non Af Amer: >60 mL/min (ref >60)
Glucose, Bld: 102 mg/dL — ABNORMAL HIGH (ref 70–99)
Potassium: 3.9 mmol/L (ref 3.5–5.1)
Sodium: 140 mmol/L (ref 135–145)
Total Bilirubin: 0.8 mg/dL (ref 0.3–1.2)
Total Protein: 6.6 g/dL (ref 6.5–8.1)

## 2019-12-23 LAB — CBC
HCT: 38.3 % (ref 36.0–46.0)
Hemoglobin: 12.1 g/dL (ref 12.0–15.0)
MCH: 26.2 pg (ref 26.0–34.0)
MCHC: 31.6 g/dL (ref 30.0–36.0)
MCV: 83.1 fL (ref 80.0–100.0)
Platelets: 199 10*3/uL (ref 150–400)
RBC: 4.61 MIL/uL (ref 3.87–5.11)
RDW: 15.2 % (ref 11.5–15.5)
WBC: 6.2 10*3/uL (ref 4.0–10.5)
nRBC: 0 % (ref 0.0–0.2)

## 2019-12-23 LAB — SARS CORONAVIRUS 2 BY RT PCR (HOSPITAL ORDER, PERFORMED IN ~~LOC~~ HOSPITAL LAB): SARS Coronavirus 2: NEGATIVE

## 2019-12-23 NOTE — ED Triage Notes (Signed)
Pt arrives POV from UC for eval of SOB x 1 mos w/ frequent PVC on their 12 lead. Per their note it states they have no prior for comparison, therefore sent her here for further eval

## 2019-12-23 NOTE — ED Notes (Signed)
EKG given to J. Darr, PA.

## 2019-12-23 NOTE — ED Triage Notes (Signed)
C/O cough and dyspnea over approx 1 month.  Had virtual visit 11/24/19, was given Rx cough meds.  States had improved some, but has continued.  States dyspnea is worse at night.

## 2019-12-23 NOTE — Discharge Instructions (Addendum)
You need further evaluation in the Emergency department today. Report to [facility] emergency department

## 2019-12-23 NOTE — ED Provider Notes (Signed)
[facility]    CSN: [id number] Arrival date & time: 12/23/19  1624      History   Chief Complaint Chief Complaint  Patient presents with  . Cough    HPI Savannah Morales is a 54 y.o. female.   Patient reports for evaluation of cough and shortness of breath for the last month.  She reports this is been present for the last at least 1 month.  She reports cough and shortness of breath are worse at night when laying down.  She reports yes to sit up to go to breathe well.  She does report over the last few days she has had some increase in the feeling of shortness of breath.  She reports she frequently has to take a deep breath in order to get a full breath of air.  She denies chest pain, abdominal pain, nausea or sweating when this occurs.  She also endorses shortness of breath with exertion over the last month.  She was seen via virtual visit at the end of July for her cough and treated symptomatically.  She reports some improvement with that however this returned more recently.  She is not had fevers or chills.  Cough has been nonproductive.  No other symptoms.     Past Medical History:  Diagnosis Date  . Diabetes mellitus without complication (HCC)    Type II  . History of chicken pox   . Hypertension   . Kidney stones     Patient Active Problem List   Diagnosis Date Noted  . Type 2 diabetes mellitus (HCC) 04/21/2016  . Hypertension associated with diabetes (HCC) 03/17/2016  . H/O urinary stone 03/17/2016    Past Surgical History:  Procedure Laterality Date  . ANKLE SURGERY      OB History   No obstetric history on file.      Home Medications    Prior to Admission medications   Medication Sig Start Date End Date Taking? Authorizing Provider  amLODipine (NORVASC) 10 MG tablet Take 1 tablet (10 mg total) by mouth daily. 05/04/19 12/23/19 Yes Ardith Dark, MD  benzonatate (TESSALON) 100 MG capsule Take 1 capsule (100 mg total) by mouth 3 (three) times  daily as needed for cough. 11/24/19  Yes Panosh, Neta Mends, MD  ELDERBERRY PO Take by mouth.   Yes [provider]  Garlic 1000 MG CAPS Take by mouth.   Yes [provider]  metFORMIN (GLUCOPHAGE-XR) 500 MG 24 hr tablet TAKE 1 TABLET BY MOUTH DAILY WITH BREAKFAST 07/20/19  Yes Ardith Dark, MD  Omega-3 Fatty Acids (FISH OIL) 1200 MG CPDR Take by mouth.   Yes [provider]  benzoyl peroxide-erythromycin (BENZAMYCIN) gel Apply topically every morning. 11/08/19   [provider]  Alain Honey Triphasic 50-30/75-40/ 125-30 MCG TABS Take 1 tablet by mouth daily. Patient not taking: Reported on 11/24/2019 10/24/19   [provider]  promethazine-dextromethorphan (PROMETHAZINE-DM) 6.25-15 MG/5ML syrup Take 5 mLs by mouth every 6 (six) hours as needed for cough (at night). 11/24/19   Panosh, Neta Mends, MD    Family History Family History  Problem Relation Age of Onset  . Hyperlipidemia Mother   . Hypertension Father   . Diabetes Father   . Cancer Paternal Grandmother     Social History Social History   Tobacco Use  . Smoking status: Never Smoker  . Smokeless tobacco: Never Used  Vaping Use  . Vaping Use: Never used  Substance Use Topics  .  Alcohol use: Not Currently    Comment: Rarely  . Drug use: No     Allergies   Patient has no known allergies.   Review of Systems Review of Systems   Physical Exam Triage Vital Signs ED Triage Vitals  Enc Vitals Group     BP 12/23/19 1703 135/90     Pulse Rate 12/23/19 1703 69     Resp 12/23/19 1703 (!) 28     Temp 12/23/19 1703 98.1 F (36.7 C)     Temp Source 12/23/19 1703 Oral     SpO2 --      Weight --      Height --      Head Circumference --      Peak Flow --      Pain Score 12/23/19 1707 0     Pain Loc --      Pain Edu? --      Excl. in GC? --    No data found.  Updated Vital Signs BP 135/90   Pulse 69 Comment: HR irregular to palpation; fluctuates btwn 69-119  Temp  98.1 F (36.7 C) (Oral)   Resp (!) 28   LMP 12/05/2019 (Approximate)   Visual Acuity Right Eye Distance:   Left Eye Distance:   Bilateral Distance:    Right Eye Near:   Left Eye Near:    Bilateral Near:     Physical Exam Vitals and nursing note reviewed.  Constitutional:      General: She is not in acute distress.    Appearance: She is well-developed. She is not ill-appearing.  HENT:     Head: Normocephalic and atraumatic.  Eyes:     Conjunctiva/sclera: Conjunctivae normal.  Cardiovascular:     Rate and Rhythm: Tachycardia present. Rhythm irregular.     Heart sounds: No murmur heard.   Pulmonary:     Effort: Pulmonary effort is normal. No respiratory distress.     Comments: Possible crackles/rales in the lower fields bilaterally Abdominal:     Palpations: Abdomen is soft.     Tenderness: There is no abdominal tenderness.  Musculoskeletal:     Cervical back: Neck supple.     Right lower leg: No edema.     Left lower leg: No edema.  Skin:    General: Skin is warm and dry.  Neurological:     Mental Status: She is alert.      UC Treatments / Results  Labs (all labs ordered are listed, but only abnormal results are displayed) Labs Reviewed - No data to display  EKG Sinus tachycardia with frequent premature ventricular complexes.  Abnormal EKG.  No comparison from previous.  Radiology No results found.  Procedures Procedures (including critical care time)  Medications Ordered in UC Medications - No data to display  Initial Impression / Assessment and Plan / UC Course  I have reviewed the triage vital signs and the nursing notes.  Pertinent labs & imaging results that were available during my care of the patient were reviewed by me and considered in my medical decision making (see chart for details).     #PVCs #Shortness of breath Patient is a 54 year old without cardiac history presenting with chronic shortness of breath found to have frequent PVCs on  EKG here.  Given increased shortness of breath and EKG findings today without comparison, feel she needs higher level of evaluation tonight.  No active chest pain.  She is otherwise stable vital signs.  Patient's daughter is here and  believe she can be self transported to [facility] emergency department for further evaluation.  Patient verbalized agreement understanding plan of care. Final Clinical Impressions(s) / UC Diagnoses   Final diagnoses:  Premature ventricular beats  Shortness of breath     Discharge Instructions     You need further evaluation in the Emergency department today. Report to [facility] emergency department     ED Prescriptions    None     PDMP not reviewed this encounter.   Hermelinda Medicus, PA-C 12/23/19 [phone]

## 2019-12-24 ENCOUNTER — Inpatient Hospital Stay (HOSPITAL_COMMUNITY): Payer: 59

## 2019-12-24 ENCOUNTER — Emergency Department (HOSPITAL_COMMUNITY): Payer: 59

## 2019-12-24 DIAGNOSIS — I493 Ventricular premature depolarization: Secondary | ICD-10-CM | POA: Diagnosis present

## 2019-12-24 DIAGNOSIS — I2781 Cor pulmonale (chronic): Secondary | ICD-10-CM | POA: Diagnosis present

## 2019-12-24 DIAGNOSIS — I509 Heart failure, unspecified: Secondary | ICD-10-CM | POA: Diagnosis not present

## 2019-12-24 DIAGNOSIS — I5041 Acute combined systolic (congestive) and diastolic (congestive) heart failure: Secondary | ICD-10-CM | POA: Diagnosis present

## 2019-12-24 DIAGNOSIS — Z833 Family history of diabetes mellitus: Secondary | ICD-10-CM | POA: Diagnosis not present

## 2019-12-24 DIAGNOSIS — I152 Hypertension secondary to endocrine disorders: Secondary | ICD-10-CM | POA: Diagnosis present

## 2019-12-24 DIAGNOSIS — E119 Type 2 diabetes mellitus without complications: Secondary | ICD-10-CM | POA: Diagnosis not present

## 2019-12-24 DIAGNOSIS — I5021 Acute systolic (congestive) heart failure: Secondary | ICD-10-CM

## 2019-12-24 DIAGNOSIS — Z8249 Family history of ischemic heart disease and other diseases of the circulatory system: Secondary | ICD-10-CM | POA: Diagnosis not present

## 2019-12-24 DIAGNOSIS — I272 Pulmonary hypertension, unspecified: Secondary | ICD-10-CM | POA: Diagnosis not present

## 2019-12-24 DIAGNOSIS — Z809 Family history of malignant neoplasm, unspecified: Secondary | ICD-10-CM | POA: Diagnosis not present

## 2019-12-24 DIAGNOSIS — Z7984 Long term (current) use of oral hypoglycemic drugs: Secondary | ICD-10-CM | POA: Diagnosis not present

## 2019-12-24 DIAGNOSIS — E1169 Type 2 diabetes mellitus with other specified complication: Secondary | ICD-10-CM | POA: Diagnosis present

## 2019-12-24 DIAGNOSIS — I420 Dilated cardiomyopathy: Secondary | ICD-10-CM | POA: Diagnosis present

## 2019-12-24 DIAGNOSIS — I2510 Atherosclerotic heart disease of native coronary artery without angina pectoris: Secondary | ICD-10-CM | POA: Diagnosis present

## 2019-12-24 DIAGNOSIS — R7989 Other specified abnormal findings of blood chemistry: Secondary | ICD-10-CM | POA: Diagnosis not present

## 2019-12-24 DIAGNOSIS — Z87442 Personal history of urinary calculi: Secondary | ICD-10-CM | POA: Diagnosis not present

## 2019-12-24 DIAGNOSIS — Z83438 Family history of other disorder of lipoprotein metabolism and other lipidemia: Secondary | ICD-10-CM | POA: Diagnosis not present

## 2019-12-24 DIAGNOSIS — Z79899 Other long term (current) drug therapy: Secondary | ICD-10-CM | POA: Diagnosis not present

## 2019-12-24 DIAGNOSIS — R0600 Dyspnea, unspecified: Secondary | ICD-10-CM | POA: Diagnosis not present

## 2019-12-24 DIAGNOSIS — R0602 Shortness of breath: Secondary | ICD-10-CM | POA: Diagnosis present

## 2019-12-24 DIAGNOSIS — Z20822 Contact with and (suspected) exposure to covid-19: Secondary | ICD-10-CM | POA: Diagnosis present

## 2019-12-24 DIAGNOSIS — E0590 Thyrotoxicosis, unspecified without thyrotoxic crisis or storm: Secondary | ICD-10-CM | POA: Diagnosis present

## 2019-12-24 DIAGNOSIS — I472 Ventricular tachycardia: Secondary | ICD-10-CM | POA: Diagnosis present

## 2019-12-24 DIAGNOSIS — I2729 Other secondary pulmonary hypertension: Secondary | ICD-10-CM | POA: Diagnosis present

## 2019-12-24 LAB — HEMOGLOBIN A1C
Hgb A1c MFr Bld: 6.6 % — ABNORMAL HIGH (ref 4.8–5.6)
Mean Plasma Glucose: 142.72 mg/dL

## 2019-12-24 LAB — MAGNESIUM: Magnesium: 2.1 mg/dL (ref 1.7–2.4)

## 2019-12-24 LAB — ECHOCARDIOGRAM COMPLETE
MV M vel: 4.61 m/s
MV Peak grad: 85 mmHg
Radius: 0.2 cm
S' Lateral: 5.7 cm
Weight: 3120 oz

## 2019-12-24 LAB — HIV ANTIBODY (ROUTINE TESTING W REFLEX): HIV Screen 4th Generation wRfx: NONREACTIVE

## 2019-12-24 LAB — GLUCOSE, CAPILLARY: Glucose-Capillary: 137 mg/dL — ABNORMAL HIGH (ref 70–99)

## 2019-12-24 LAB — TROPONIN I (HIGH SENSITIVITY)
Troponin I (High Sensitivity): 20 ng/L — ABNORMAL HIGH (ref <18)
Troponin I (High Sensitivity): 24 ng/L — ABNORMAL HIGH (ref <18)

## 2019-12-24 LAB — BRAIN NATRIURETIC PEPTIDE: B Natriuretic Peptide: 607.9 pg/mL — ABNORMAL HIGH (ref 0.0–100.0)

## 2019-12-24 LAB — I-STAT BETA HCG BLOOD, ED (MC, WL, AP ONLY): I-stat hCG, quantitative: <5 m[IU]/mL (ref <5)

## 2019-12-24 MED ORDER — METOPROLOL TARTRATE 12.5 MG HALF TABLET: 12.5000 mg | ORAL_TABLET | Freq: Two times a day (BID) | ORAL | Status: DC

## 2019-12-24 MED ORDER — ACETAMINOPHEN 500 MG PO TABS: 1000.0000 mg | ORAL_TABLET | Freq: Four times a day (QID) | ORAL | Status: DC | PRN

## 2019-12-24 MED ORDER — LISINOPRIL 5 MG PO TABS
2.5000 mg | ORAL_TABLET | Freq: Every day | ORAL | Status: DC
  Filled 2019-12-24: qty 1

## 2019-12-24 MED ORDER — ENOXAPARIN SODIUM 40 MG/0.4ML ~~LOC~~ SOLN
40.0000 mg | SUBCUTANEOUS | Status: DC
  Administered 2019-12-24 – 2019-12-27 (×4): 40 mg via SUBCUTANEOUS
  Filled 2019-12-24 (×3): qty 0.4

## 2019-12-24 MED ORDER — SODIUM CHLORIDE 0.9 % IV BOLUS
500.0000 mL | Freq: Once | INTRAVENOUS | Status: AC
  Administered 2019-12-24: 500 mL via INTRAVENOUS

## 2019-12-24 MED ORDER — IOHEXOL 350 MG/ML SOLN
75.0000 mL | Freq: Once | INTRAVENOUS | Status: AC | PRN
  Administered 2019-12-24: 75 mL via INTRAVENOUS

## 2019-12-24 MED ORDER — GARLIC 1000 MG PO CAPS: 1.0000 | ORAL_CAPSULE | Freq: Every day | ORAL | Status: DC

## 2019-12-24 MED ORDER — FUROSEMIDE 10 MG/ML IJ SOLN
60.0000 mg | Freq: Once | INTRAMUSCULAR | Status: AC
  Administered 2019-12-24: 60 mg via INTRAVENOUS
  Filled 2019-12-24: qty 6

## 2019-12-24 MED ORDER — ONDANSETRON HCL 4 MG/2ML IJ SOLN: 4.0000 mg | Freq: Four times a day (QID) | INTRAMUSCULAR | Status: DC | PRN

## 2019-12-24 MED ORDER — INSULIN ASPART 100 UNIT/ML ~~LOC~~ SOLN
0.0000 [IU] | Freq: Three times a day (TID) | SUBCUTANEOUS | Status: DC
  Administered 2019-12-24 – 2019-12-25 (×2): 2 [IU] via SUBCUTANEOUS
  Administered 2019-12-26: 3 [IU] via SUBCUTANEOUS
  Administered 2019-12-27: 0 [IU] via SUBCUTANEOUS

## 2019-12-24 MED ORDER — SODIUM CHLORIDE 0.9 % IV SOLN: 250.0000 mL | INTRAVENOUS | Status: DC | PRN

## 2019-12-24 MED ORDER — SODIUM CHLORIDE 0.9% FLUSH
3.0000 mL | Freq: Two times a day (BID) | INTRAVENOUS | Status: DC
  Administered 2019-12-24 – 2019-12-27 (×7): 3 mL via INTRAVENOUS

## 2019-12-24 MED ORDER — SODIUM CHLORIDE 0.9% FLUSH: 3.0000 mL | INTRAVENOUS | Status: DC | PRN

## 2019-12-24 MED ORDER — FISH OIL 1200 MG PO CPDR: 2.0000 | DELAYED_RELEASE_CAPSULE | Freq: Every day | ORAL | Status: DC

## 2019-12-24 MED ORDER — ELDERBERRY 575 MG/5ML PO SYRP: ORAL_SOLUTION | Freq: Every day | ORAL | Status: DC

## 2019-12-24 MED ORDER — METOPROLOL TARTRATE 25 MG PO TABS
25.0000 mg | ORAL_TABLET | Freq: Two times a day (BID) | ORAL | Status: DC
  Administered 2019-12-24 – 2019-12-25 (×2): 25 mg via ORAL
  Filled 2019-12-24 (×2): qty 1

## 2019-12-24 NOTE — ED Notes (Signed)
Pt ambulated without difficulty. Gait steady and even. Oxygen saturations remained 95% and above.

## 2019-12-24 NOTE — ED Notes (Addendum)
ECHO at bedside.

## 2019-12-24 NOTE — Progress Notes (Signed)
  Echocardiogram 2D Echocardiogram has been performed.  Delcie Roch 12/24/2019, 4:22 PM

## 2019-12-24 NOTE — TOC Initial Note (Signed)
Transition of Care [facility]) - Initial/Assessment Note    Patient Details  Name: Savannah Morales MRN: [id number] Date of Birth: 02/25/1966  Transition of Care [facility]) CM/SW Contact:    Lockie Pares, RN Phone Number: 12/24/2019, 2:14 PM  Clinical Narrative:                 Admitting to hospitalist service with cardiology consulted for CHF work up. Patient was vaccinated.for COVId COVID negative.  Patient is a part of [facility] would benefit from remote health if active.  CM will follow for needs.  Expected Discharge Plan: Home/Self Care Barriers to Discharge: Continued Medical Work up   Patient Goals and CMS Choice        Expected Discharge Plan and Services Expected Discharge Plan: Home/Self Care       Living arrangements for the past 2 months: Apartment                                      Prior Living Arrangements/Services Living arrangements for the past 2 months: Apartment   Patient language and need for interpreter reviewed:: Yes        Need for Family Participation in Patient Care: Yes (Comment) Care giver support system in place?: Yes (comment)   Criminal Activity/Legal Involvement Pertinent to Current Situation/Hospitalization: No - Comment as needed  Activities of Daily Living      Permission Sought/Granted      Share Information with NAME: Daughter CHristy           Emotional Assessment Appearance:: Appears older than stated age     Orientation: : Oriented to Self, Oriented to Place, Oriented to  Time, Oriented to Situation Alcohol / Substance Use: Not Applicable Psych Involvement: No (comment)  Admission diagnosis:  CHF (congestive heart failure) (HCC) [I50.9] Patient Active Problem List   Diagnosis Date Noted  . Acute heart failure (HCC) 12/24/2019  . CHF (congestive heart failure) (HCC) 12/24/2019  . Type 2 diabetes mellitus (HCC) 04/21/2016  . Hypertension associated with diabetes (HCC) 03/17/2016  . H/O urinary stone 03/17/2016    PCP:  Ardith Dark, MD Pharmacy:   [facility] [phone] [place], [place] - [address] AT [facility] OF [address] [place] [address] Phone: [phone] Fax: [phone]  [facility] [place], [place] - [address] [place] [address] Phone: [phone] Fax: [phone]     Social Determinants of Health (SDOH) Interventions    Readmission Risk Interventions No flowsheet data found.

## 2019-12-24 NOTE — ED Provider Notes (Signed)
[facility] EMERGENCY DEPARTMENT Provider Note   CSN: [id number] Arrival date & time: 12/23/19  1745     History Chief Complaint  Patient presents with  . Shortness of Breath    Savannah Morales is a 54 y.o. female with past medical history significant for diabetes, hypertension who presents for evaluation of shortness of breath and cough.  Has had intermittent shortness of breath and chronic cough over the last month.  Was seen by PCP which thought this was viral in nature.  Symptoms started after she returned after driving from [place] to see family.  She received the Covid vaccine.  Not been around anyone has been sick. She is not followed by cardiology. Cough worse when she lays down at night.  Feels like she needs to sit up to breathe. Intermittent SOB with exertion. No palpitations. Denies PND, orthopnea. Sleeps with 1 pillow at night.  Denies headache, lightheadedness, dizziness, fever, chills, nausea, vomiting, chest pain, hemoptysis, abdominal pain, diarrhea, dysuria, unilateral leg swelling, redness, warmth, edema to bilateral lower extremities.  No diaphoresis, nausea or vomiting when she becomes short of breath.  Thought her cough had improved since she was seen by PCP however returned two weeks ago.  Cough nonproductive.  Chronic left ankle swelling since 2016 injury. No unilateral calf pain, swelling, redness.  Denies additional aggravating or relieving factors.  History obtained from patient and past medical records.  No interpreter used.   Seen by [facility] 12/23/19.Noted to have frequent PVC on EKG and sent to ED for evaluation.   HPI     Past Medical History:  Diagnosis Date  . Diabetes mellitus without complication (HCC)    Type II  . History of chicken pox   . Hypertension   . Kidney stones     Patient Active Problem List   Diagnosis Date Noted  . Type 2 diabetes mellitus (HCC) 04/21/2016  . Hypertension associated with diabetes (HCC) 03/17/2016  .  H/O urinary stone 03/17/2016    Past Surgical History:  Procedure Laterality Date  . ANKLE SURGERY       OB History   No obstetric history on file.     Family History  Problem Relation Age of Onset  . Hyperlipidemia Mother   . Hypertension Father   . Diabetes Father   . Cancer Paternal Grandmother     Social History   Tobacco Use  . Smoking status: Never Smoker  . Smokeless tobacco: Never Used  Vaping Use  . Vaping Use: Never used  Substance Use Topics  . Alcohol use: Not Currently    Comment: Rarely  . Drug use: No    Home Medications Prior to Admission medications   Medication Sig Start Date End Date Taking? Authorizing Provider  acetaminophen (TYLENOL) 500 MG tablet Take 1,000 mg by mouth every 6 (six) hours as needed for mild pain.   Yes [provider]  amLODipine (NORVASC) 10 MG tablet Take 1 tablet (10 mg total) by mouth daily. 05/04/19 12/24/19 Yes Ardith Dark, MD  benzoyl peroxide-erythromycin [facility]) gel Apply 1 application topically daily.  11/08/19  Yes [provider]  ELDERBERRY PO Take 2 tablets by mouth daily.    Yes [provider]  Garlic 1000 MG CAPS Take 1 capsule by mouth daily.    Yes [provider]  metFORMIN (GLUCOPHAGE-XR) 500 MG 24 hr tablet TAKE 1 TABLET BY MOUTH DAILY WITH BREAKFAST Patient taking differently: Take 500 mg by mouth daily with breakfast.  07/20/19  Yes Ardith Dark, MD  Omega-3 Fatty Acids (FISH OIL) 1200 MG CPDR Take 2 capsules by mouth daily.    Yes [provider]  benzonatate (TESSALON) 100 MG capsule Take 1 capsule (100 mg total) by mouth 3 (three) times daily as needed for cough. Patient not taking: Reported on 12/24/2019 11/24/19   Madelin Headings, MD  promethazine-dextromethorphan (PROMETHAZINE-DM) 6.25-15 MG/5ML syrup Take 5 mLs by mouth every 6 (six) hours as needed for cough (at night). Patient not taking: Reported on 12/24/2019 11/24/19   Panosh, Neta Mends, MD     Allergies    Patient has no known allergies.  Review of Systems   Review of Systems  Constitutional: Negative.   HENT: Negative.   Eyes: Negative.   Respiratory: Positive for cough and shortness of breath. Negative for apnea, choking, chest tightness, wheezing and stridor.   Cardiovascular: Positive for palpitations. Negative for chest pain and leg swelling.  Gastrointestinal: Negative.   Genitourinary: Negative.   Musculoskeletal: Negative.   Skin: Negative.   Neurological: Negative.   All other systems reviewed and are negative.   Physical Exam Updated Vital Signs BP 121/89 (BP Location: Right Arm)   Pulse (!) 109   Temp 98.1 F (36.7 C) (Oral)   Resp 18   Wt 88.5 kg   LMP 12/05/2019 (Approximate)   SpO2 100%   BMI 29.65 kg/m   Physical Exam Vitals and nursing note reviewed.  Constitutional:      General: She is not in acute distress.    Appearance: She is well-developed. She is not ill-appearing, toxic-appearing or diaphoretic.  HENT:     Head: Normocephalic and atraumatic.     Jaw: There is normal jaw occlusion.  Eyes:     Pupils: Pupils are equal, round, and reactive to light.  Neck:     Vascular: No carotid bruit or JVD.     Trachea: Trachea and phonation normal.  Cardiovascular:     Rate and Rhythm: Tachycardia present. Rhythm irregular.     Pulses: Normal pulses.          Radial pulses are 2+ on the right side and 2+ on the left side.       Dorsalis pedis pulses are 2+ on the right side and 2+ on the left side.       Posterior tibial pulses are 2+ on the right side and 2+ on the left side.     Heart sounds: Normal heart sounds.  Pulmonary:     Effort: Pulmonary effort is normal. No respiratory distress.     Breath sounds: Normal breath sounds and air entry.     Comments: Inspiaratory crackles to left lower lobe. Speaks in full sentences without difficulty Chest:     Chest wall: No mass, tenderness or edema.  Abdominal:     General: Bowel sounds  are normal. There is no distension.     Palpations: Abdomen is soft.     Tenderness: There is no abdominal tenderness. There is no right CVA tenderness, left CVA tenderness, guarding or rebound. Negative signs include Murphy's sign and McBurney's sign.     Hernia: No hernia is present.  Musculoskeletal:        General: Normal range of motion.     Cervical back: Full passive range of motion without pain, normal range of motion and neck supple.     Right lower leg: No tenderness. No edema.     Left lower leg: No tenderness. No  edema.     Comments: Moves all 4 extremities without difficulty. Old surgical incision to left ankle. Some mild left ankle swelling which patient states is chronic. Compartments soft. Denna Haggard' sign negative. No bony tenderness.  Feet:     Right foot:     Skin integrity: Skin integrity normal.     Left foot:     Skin integrity: Skin integrity normal.  Skin:    General: Skin is warm and dry.     Capillary Refill: Capillary refill takes less than 2 seconds.     Comments: No erythema, warmth, rashes or lesions  Neurological:     General: No focal deficit present.     Mental Status: She is alert.     Cranial Nerves: Cranial nerves are intact.     Sensory: Sensation is intact.     Motor: Motor function is intact.     Coordination: Coordination is intact.     Gait: Gait is intact.     Comments: Ambulatory without difficulty     ED Results / Procedures / Treatments   Labs (all labs ordered are listed, but only abnormal results are displayed) Labs Reviewed  COMPREHENSIVE METABOLIC PANEL - Abnormal; Notable for the following components:      Result Value   Glucose, Bld 102 (*)    Calcium 8.8 (*)    AST 43 (*)    ALT 49 (*)    All other components within normal limits  BRAIN NATRIURETIC PEPTIDE - Abnormal; Notable for the following components:   B Natriuretic Peptide 607.9 (*)    All other components within normal limits  TROPONIN I (HIGH SENSITIVITY) - Abnormal;  Notable for the following components:   Troponin I (High Sensitivity) 20 (*)    All other components within normal limits  TROPONIN I (HIGH SENSITIVITY) - Abnormal; Notable for the following components:   Troponin I (High Sensitivity) 24 (*)    All other components within normal limits  SARS CORONAVIRUS 2 BY RT PCR (HOSPITAL ORDER, PERFORMED IN [facility])  CBC  MAGNESIUM  I-STAT BETA HCG BLOOD, ED ([facility], [facility], [facility] ONLY)    EKG EKG Interpretation  Date/Time:  Saturday December 23 2019 18:16:27 EDT Ventricular Rate:  112 PR Interval:  158 QRS Duration: 90 QT Interval:  370 QTC Calculation: 505 R Axis:   50 Text Interpretation: Sinus tachycardia with frequent Premature ventricular complexes Possible Left atrial enlargement Borderline ECG Confirmed by Marianna Fuss ([id number]) on 12/24/2019 7:02:20 AM   Radiology DG Chest 2 View  Result Date: 12/23/2019 CLINICAL DATA:  Shortness of breath for 1 month complaining by dry cough, hypertension controlled by medication, diabetes mellitus on metformin EXAM: CHEST - 2 VIEW COMPARISON:  None FINDINGS: Enlargement of cardiac silhouette. Mediastinal contours and pulmonary vascularity normal. Retrocardiac LEFT lower lobe opacity with loss of LEFT diaphragmatic silhouette likely representing pneumonia. Remaining lungs clear. No definite pleural effusion or pneumothorax. Mild broad-based levoconvex thoracic scoliosis. IMPRESSION: LEFT lower lobe pneumonia. Enlargement of cardiac silhouette. Electronically Signed   By: Ulyses Southward M.D.   On: 12/23/2019 19:22   CT Angio Chest PE W and/or Wo Contrast  Result Date: 12/24/2019 CLINICAL DATA:  Chest pain and shortness of breath. EXAM: CT ANGIOGRAPHY CHEST WITH CONTRAST TECHNIQUE: Multidetector CT imaging of the chest was performed using the standard protocol during bolus administration of intravenous contrast. Multiplanar CT image reconstructions and MIPs were obtained to evaluate the vascular anatomy.  CONTRAST:  15mL OMNIPAQUE IOHEXOL 350 MG/ML SOLN  COMPARISON:  Chest x-ray December 23, 2019 FINDINGS: Cardiovascular: Cardiomegaly. No coronary artery calcifications are identified. There is poor opacification of the thoracic aorta limiting evaluation but no aneurysm is seen. No atherosclerotic change. The lack of opacification of the thoracic aorta precludes evaluation for a dissection. The main pulmonary artery measures 3.7 cm. No left-sided pulmonary emboli. Evaluation of the right upper lobe pulmonary arterial branches is significantly limited due to streak artifact secondary to dense contrast opacifications the SVC. Apparent filling defects in central upper lobe branches are thought to be due to the streak artifact. Evaluation of the central right lower lobe branches is also limited due to streak artifact. For example, an apparent filling defects seen on series 7, image 194 correlates with streak artifact. Mediastinum/Nodes: The chest wall is normal. Tiny pleural effusions are seen. The esophagus is normal. No adenopathy. The thyroid is normal. Lungs/Pleura: The central airways are normal. Bronchial wall thickening is seen in the bases bilaterally. Mild interlobular septal thickening is seen in the apices. Mild scattered ground-glass is seen. There is an apparent nodule posteriorly on the right on series 6, image 81 measuring 5 mm. No suspicious masses. Upper Abdomen: No acute abnormality. Musculoskeletal: No chest wall abnormality. No acute or significant osseous findings. Review of the MIP images confirms the above findings. IMPRESSION: 1. Evaluation for pulmonary emboli in the right upper lobe and the central right lower lobe is limited due to streak artifact secondary to dense contrast in the SVC. Apparent filling defects in these regions are thought to be due to this artifact. Within this limitation, no convincing evidence of pulmonary emboli. 2. Cardiomegaly, mild edema, and tiny pleural effusions. 3.  Bronchial wall thickening in the bases suggesting bronchitis. 4. The main pulmonary artery measures 3.7 cm raising the possibility of pulmonary arterial hypertension. 5. 5 mm nodule in the right lung. No follow-up needed if patient is low-risk. Non-contrast chest CT can be considered in 12 months if patient is high-risk. This recommendation follows the consensus statement: Guidelines for Management of Incidental Pulmonary Nodules Detected on CT Images: From the Fleischner Society 2017; Radiology 2017; 284:228-243. Electronically Signed   By: Gerome Sam III M.D   On: 12/24/2019 09:47    Procedures Procedures (including critical care time)  Medications Ordered in ED Medications  sodium chloride 0.9 % bolus 500 mL (500 mLs Intravenous New Bag/Given 12/24/19 0731)  iohexol (OMNIPAQUE) 350 MG/ML injection 75 mL (75 mLs Intravenous Contrast Given 12/24/19 0831)  furosemide (LASIX) injection 60 mg (60 mg Intravenous Given 12/24/19 1147)    ED Course  I have reviewed the triage vital signs and the nursing notes.  Pertinent labs & imaging results that were available during my care of the patient were reviewed by me and considered in my medical decision making (see chart for details).  54 year old presents for evaluaiton of shortness of breath and cough x1 month. Shortness of breath intermittent in nature however occurs primarily with laying flat and movement. No associated chest pain. She is afebrile, nonseptic, not ill-appearing. Patient with crackles to left lower lobe. She speaks in full sentences without difficulty. Sx began began after visiting family and driving to [place] 1 month ago. No prior history of clotting disorders, PE, DVT. No unilateral calf swelling, redness or warmth. She is intermittently tachycardic with frequent PVCs on EKG. Seen by urgent care and sent to ED for evaluation. She has occasional  palpitations. She is not followed by cardiology.  Unfortunately prolonged stay in the waiting  room, work-up started from triage.  Labs and imaging personally reviewed and interpreted:  CBC without leukocytosis Metabolic panel with mild hyperglycemia to 102, calcium 8.8, mild transaminitis with AST at 43, ALT at 49 Trop 20>>>24 Mag 2.1 Preg negative BNP 607.9 DG chest with left lower lobe pneumonia COVID negative EKG with sinus tachycardia with frequent PVCs CTA chest without PE however difficult study due to significant streaking.  Does have bronchial thickening consistent with bronchitis.  She has cardiomegaly and edema. Patient reassessed. Discussed imaging and lab findings consistent with likely new onset heart failure given cardiomegaly, edema on CT. She also has likely bronchitis given bronchial thickening, no infiltrates noted on CT will consult with Cardiology to determine if patient needs admission for Echo and diuresis vs outpatient follow up.  Patient ambulatory however does have some dyspnea on exertion.  Oxygen saturations 95%.  CONSULT with Dr. Anne Fu with Cardiology states since patient still feels short of breath with an extended movement even after diuresis would be reasonable to bring her in under the medicine service for echo and further management.  Reassessed.  She is agreeable to admission.  Was given Lasix however does have some persistent mild dyspnea on exertion with tachycardia with ambulation. Will admit for further workup.  CONSULT with Dr. Chipper Herb with [facility] who agrees to evaluate patient for admission.  The patient appears reasonably stabilized for admission considering the current resources, flow, and capabilities available in the ED at this time, and I doubt any other [facility] requiring further screening and/or treatment in the ED prior to admission. Discussed with attending, Dr. Stevie Kern who agrees with above treatment, plan and disposition.   MDM Rules/Calculators/A&P                          Savannah Morales was evaluated in Emergency Department on  12/24/2019 for the symptoms described in the history of present illness. She was evaluated in the context of the global COVID-19 pandemic, which necessitated consideration that the patient might be at risk for infection with the SARS-CoV-2 virus that causes COVID-19. Institutional protocols and algorithms that pertain to the evaluation of patients at risk for COVID-19 are in a state of rapid change based on information released by regulatory bodies including the CDC and federal and state organizations. These policies and algorithms were followed during the patient's care in the ED. Final Clinical Impression(s) / ED Diagnoses Final diagnoses:  Acute heart failure, unspecified heart failure type (HCC)  Dyspnea on exertion  Elevated troponin    Rx / DC Orders ED Discharge Orders    None       Vickey Boak A, PA-C 12/24/19 1312    Milagros Loll, MD 12/25/19 [phone]

## 2019-12-24 NOTE — H&P (Addendum)
History and Physical    Analyse Angst OXB:[id number] DOB: [birth year] DOA: 12/23/2019  PCP: Ardith Dark, MD (Confirm with patient/family/NH records and if not entered, this has to be entered at [facility] point of entry) Patient coming from: Home  I have personally briefly reviewed patient's old medical records in [facility]  Chief Complaint: SOB  HPI: Savannah Morales is a 54 y.o. female with medical history significant of HTN, IIDM, presented with increasing SOB for 4+ weeks.  Patient has been encouraged overall health status until about 4 weeks ago, started to feel short of breath associated with exertion.  At the beginning she was able to walk 15 to 20 minutes before feeling short of breath but soon she started to have this dry cough turned into productive to clear sputum.  And increasingly she started to feel short of breath more frequently now even with minimal movement she is out of breath easily.  Denies any fever chills no chest pains.  She was seen by her PCP on virtual visit 3 weeks ago was diagnosed with bronchitis, and she was on cough medications. Despite the PRN cough meds, her symptoms not improving. She also developed a bilateral ankle swelling for about one week.  ED Course: CTA negative for PE. Tele monitoring and EKG showed frequent PVCs. CTA showed cardiomegaly and signs of pulmonary HTN.  Review of Systems: As per HPI otherwise 14 point review of systems negative.    Past Medical History:  Diagnosis Date  . Diabetes mellitus without complication (HCC)    Type II  . History of chicken pox   . Hypertension   . Kidney stones     Past Surgical History:  Procedure Laterality Date  . ANKLE SURGERY       reports that she has never smoked. She has never used smokeless tobacco. She reports previous alcohol use. She reports that she does not use drugs.  No Known Allergies  Family History  Problem Relation Age of Onset  . Hyperlipidemia Mother   . Hypertension  Father   . Diabetes Father   . Cancer Paternal Grandmother      Prior to Admission medications   Medication Sig Start Date End Date Taking? Authorizing Provider  acetaminophen (TYLENOL) 500 MG tablet Take 1,000 mg by mouth every 6 (six) hours as needed for mild pain.   Yes [provider]  amLODipine (NORVASC) 10 MG tablet Take 1 tablet (10 mg total) by mouth daily. 05/04/19 12/24/19 Yes Ardith Dark, MD  benzoyl peroxide-erythromycin [facility]) gel Apply 1 application topically daily.  11/08/19  Yes [provider]  ELDERBERRY PO Take 2 tablets by mouth daily.    Yes [provider]  Garlic 1000 MG CAPS Take 1 capsule by mouth daily.    Yes [provider]  metFORMIN (GLUCOPHAGE-XR) 500 MG 24 hr tablet TAKE 1 TABLET BY MOUTH DAILY WITH BREAKFAST Patient taking differently: Take 500 mg by mouth daily with breakfast.  07/20/19  Yes Ardith Dark, MD  Omega-3 Fatty Acids (FISH OIL) 1200 MG CPDR Take 2 capsules by mouth daily.    Yes [provider]  benzonatate (TESSALON) 100 MG capsule Take 1 capsule (100 mg total) by mouth 3 (three) times daily as needed for cough. Patient not taking: Reported on 12/24/2019 11/24/19   Madelin Headings, MD  promethazine-dextromethorphan (PROMETHAZINE-DM) 6.25-15 MG/5ML syrup Take 5 mLs by mouth every 6 (six) hours as needed for cough (at night). Patient not taking: Reported on  12/24/2019 11/24/19   Madelin Headings, MD    Physical Exam: Vitals:   12/24/19 0805 12/24/19 1115 12/24/19 1145 12/24/19 1315  BP: 103/75 121/89 112/80 119/86  Pulse: 82 (!) 109 (!) 34 (!) 105  Resp: 17 18 (!) 23 (!) 25  Temp: 98.1 F (36.7 C)     TempSrc: Oral     SpO2: 98% 100% 100% 100%  Weight:        Constitutional: NAD, calm, comfortable Vitals:   12/24/19 0805 12/24/19 1115 12/24/19 1145 12/24/19 1315  BP: 103/75 121/89 112/80 119/86  Pulse: 82 (!) 109 (!) 34 (!) 105  Resp: 17 18 (!) 23 (!) 25  Temp: 98.1 F (36.7 C)      TempSrc: Oral     SpO2: 98% 100% 100% 100%  Weight:       Eyes: PERRL, lids and conjunctivae normal ENMT: Mucous membranes are moist. Posterior pharynx clear of any exudate or lesions.Normal dentition.  Neck: normal, supple, no masses, no thyromegaly Respiratory: clear to auscultation bilaterally, fine crackles on B/L bases. Increased respiratory effort. No accessory muscle use.  Cardiovascular: Regular rate and rhythm, no murmurs / rubs / gallops. 1+ extremity edema. 2+ pedal pulses. No carotid bruits.  Abdomen: no tenderness, no masses palpated. No hepatosplenomegaly. Bowel sounds positive.  Musculoskeletal: no clubbing / cyanosis. No joint deformity upper and lower extremities. Good ROM, no contractures. Normal muscle tone.  Skin: no rashes, lesions, ulcers. No induration Neurologic: CN 2-12 grossly intact. Sensation intact, DTR normal. Strength 5/5 in all 4.  Psychiatric: Normal judgment and insight. Alert and oriented x 3. Normal mood.     Labs on Admission: I have personally reviewed following labs and imaging studies  CBC: Recent Labs  Lab 12/23/19 1841  WBC 6.2  HGB 12.1  HCT 38.3  MCV 83.1  PLT 199   Basic Metabolic Panel: Recent Labs  Lab 12/23/19 1841 12/24/19 0734  NA 140  --   K 3.9  --   CL 108  --   CO2 22  --   GLUCOSE 102*  --   BUN 10  --   CREATININE 0.85  --   CALCIUM 8.8*  --   MG  --  2.1   GFR: Estimated Creatinine Clearance: 90.1 mL/min (by C-G formula based on SCr of 0.85 mg/dL). Liver Function Tests: Recent Labs  Lab 12/23/19 1841  AST 43*  ALT 49*  ALKPHOS 45  BILITOT 0.8  PROT 6.6  ALBUMIN 3.7   No results for input(s): LIPASE, AMYLASE in the last 168 hours. No results for input(s): AMMONIA in the last 168 hours. Coagulation Profile: No results for input(s): INR, PROTIME in the last 168 hours. Cardiac Enzymes: No results for input(s): CKTOTAL, CKMB, CKMBINDEX, TROPONINI in the last 168 hours. BNP (last 3 results) No results  for input(s): PROBNP in the last 8760 hours. HbA1C: No results for input(s): HGBA1C in the last 72 hours. CBG: No results for input(s): GLUCAP in the last 168 hours. Lipid Profile: No results for input(s): CHOL, HDL, LDLCALC, TRIG, CHOLHDL, LDLDIRECT in the last 72 hours. Thyroid Function Tests: No results for input(s): TSH, T4TOTAL, FREET4, T3FREE, THYROIDAB in the last 72 hours. Anemia Panel: No results for input(s): VITAMINB12, FOLATE, FERRITIN, TIBC, IRON, RETICCTPCT in the last 72 hours. Urine analysis:    Component Value Date/Time   BILIRUBINUR 1+ 03/17/2016 1331   PROTEINUR 1+ 03/17/2016 1331   UROBILINOGEN negative 03/17/2016 1331   NITRITE negative 03/17/2016 1331  LEUKOCYTESUR moderate (2+) (A) 03/17/2016 1331    Radiological Exams on Admission: DG Chest 2 View  Result Date: 12/23/2019 CLINICAL DATA:  Shortness of breath for 1 month complaining by dry cough, hypertension controlled by medication, diabetes mellitus on metformin EXAM: CHEST - 2 VIEW COMPARISON:  None FINDINGS: Enlargement of cardiac silhouette. Mediastinal contours and pulmonary vascularity normal. Retrocardiac LEFT lower lobe opacity with loss of LEFT diaphragmatic silhouette likely representing pneumonia. Remaining lungs clear. No definite pleural effusion or pneumothorax. Mild broad-based levoconvex thoracic scoliosis. IMPRESSION: LEFT lower lobe pneumonia. Enlargement of cardiac silhouette. Electronically Signed   By: Ulyses Southward M.D.   On: 12/23/2019 19:22   CT Angio Chest PE W and/or Wo Contrast  Result Date: 12/24/2019 CLINICAL DATA:  Chest pain and shortness of breath. EXAM: CT ANGIOGRAPHY CHEST WITH CONTRAST TECHNIQUE: Multidetector CT imaging of the chest was performed using the standard protocol during bolus administration of intravenous contrast. Multiplanar CT image reconstructions and MIPs were obtained to evaluate the vascular anatomy. CONTRAST:  62mL OMNIPAQUE IOHEXOL 350 MG/ML SOLN COMPARISON:   Chest x-ray December 23, 2019 FINDINGS: Cardiovascular: Cardiomegaly. No coronary artery calcifications are identified. There is poor opacification of the thoracic aorta limiting evaluation but no aneurysm is seen. No atherosclerotic change. The lack of opacification of the thoracic aorta precludes evaluation for a dissection. The main pulmonary artery measures 3.7 cm. No left-sided pulmonary emboli. Evaluation of the right upper lobe pulmonary arterial branches is significantly limited due to streak artifact secondary to dense contrast opacifications the SVC. Apparent filling defects in central upper lobe branches are thought to be due to the streak artifact. Evaluation of the central right lower lobe branches is also limited due to streak artifact. For example, an apparent filling defects seen on series 7, image 194 correlates with streak artifact. Mediastinum/Nodes: The chest wall is normal. Tiny pleural effusions are seen. The esophagus is normal. No adenopathy. The thyroid is normal. Lungs/Pleura: The central airways are normal. Bronchial wall thickening is seen in the bases bilaterally. Mild interlobular septal thickening is seen in the apices. Mild scattered ground-glass is seen. There is an apparent nodule posteriorly on the right on series 6, image 81 measuring 5 mm. No suspicious masses. Upper Abdomen: No acute abnormality. Musculoskeletal: No chest wall abnormality. No acute or significant osseous findings. Review of the MIP images confirms the above findings. IMPRESSION: 1. Evaluation for pulmonary emboli in the right upper lobe and the central right lower lobe is limited due to streak artifact secondary to dense contrast in the SVC. Apparent filling defects in these regions are thought to be due to this artifact. Within this limitation, no convincing evidence of pulmonary emboli. 2. Cardiomegaly, mild edema, and tiny pleural effusions. 3. Bronchial wall thickening in the bases suggesting bronchitis. 4.  The main pulmonary artery measures 3.7 cm raising the possibility of pulmonary arterial hypertension. 5. 5 mm nodule in the right lung. No follow-up needed if patient is low-risk. Non-contrast chest CT can be considered in 12 months if patient is high-risk. This recommendation follows the consensus statement: Guidelines for Management of Incidental Pulmonary Nodules Detected on CT Images: From the Fleischner Society 2017; Radiology 2017; 284:228-243. Electronically Signed   By: Gerome Sam III M.D   On: 12/24/2019 09:47    EKG: Independently reviewed. Sinus tachycardia with frequent PVCs  Assessment/Plan Active Problems:   Acute CHF (congestive heart failure) (HCC)   CHF (congestive heart failure) (HCC)  (please populate well all problems here in Problem List. (  For example, if patient is on BP meds at home and you resume or decide to hold them, it is a problem that needs to be her. Same for CAD, COPD, HLD and so on)  New onset of CHF, likely systolic, NYHA class II -TTE ordered. -Start ACEI and low dose beta blocker -Cardio recommends Lasix which was given in ED. Recheck chest Xray and volume status in AM to decide further diuresis. -CTA showed pulmonary HTN sign implying right sided failure/cor pulmonale as well -Check TSH and Lipid  HTN -BP borderline low, D/C Amlodipine and start low dose of ACEI and beta blocker  IIDM -Sliding scale for now in case pt needs cath during this admission  DVT prophylaxis: [place] Code Status: Full code Family Communication: None at bedside Disposition Plan: Expect more than 2 midnight hospital stay for new onset CHF workup. Consults called: Cardio Admission status: Tele admit   Emeline General MD [facility] Pager [phone]  12/24/2019, 1:48 PM

## 2019-12-24 NOTE — Progress Notes (Signed)
_Patient had 8 runs of V. tach nonsustained and heart rate maintained in the 120s, blood pressure improved to systolic 110s, will increase metoprolol to 25 mg twice daily, and hold lisinopril for now.

## 2019-12-25 ENCOUNTER — Inpatient Hospital Stay (HOSPITAL_COMMUNITY): Payer: 59

## 2019-12-25 DIAGNOSIS — I420 Dilated cardiomyopathy: Secondary | ICD-10-CM

## 2019-12-25 DIAGNOSIS — I5021 Acute systolic (congestive) heart failure: Secondary | ICD-10-CM

## 2019-12-25 DIAGNOSIS — E119 Type 2 diabetes mellitus without complications: Secondary | ICD-10-CM

## 2019-12-25 DIAGNOSIS — R7989 Other specified abnormal findings of blood chemistry: Secondary | ICD-10-CM

## 2019-12-25 DIAGNOSIS — I5041 Acute combined systolic (congestive) and diastolic (congestive) heart failure: Principal | ICD-10-CM

## 2019-12-25 DIAGNOSIS — I272 Pulmonary hypertension, unspecified: Secondary | ICD-10-CM

## 2019-12-25 LAB — SEDIMENTATION RATE: Sed Rate: 8 mm/hr (ref 0–22)

## 2019-12-25 LAB — LIPID PANEL
Cholesterol: 160 mg/dL (ref 0–200)
HDL: 50 mg/dL (ref >40)
LDL Cholesterol: 98 mg/dL (ref 0–99)
Total CHOL/HDL Ratio: 3.2 RATIO
Triglycerides: 60 mg/dL (ref <150)
VLDL: 12 mg/dL (ref 0–40)

## 2019-12-25 LAB — BASIC METABOLIC PANEL
Anion gap: 13 (ref 5–15)
BUN: 14 mg/dL (ref 6–20)
CO2: 22 mmol/L (ref 22–32)
Calcium: 9.4 mg/dL (ref 8.9–10.3)
Chloride: 106 mmol/L (ref 98–111)
Creatinine, Ser: 0.88 mg/dL (ref 0.44–1.00)
GFR calc Af Amer: >60 mL/min (ref >60)
GFR calc non Af Amer: >60 mL/min (ref >60)
Glucose, Bld: 172 mg/dL — ABNORMAL HIGH (ref 70–99)
Potassium: 3.5 mmol/L (ref 3.5–5.1)
Sodium: 141 mmol/L (ref 135–145)

## 2019-12-25 LAB — GLUCOSE, CAPILLARY
Glucose-Capillary: 140 mg/dL — ABNORMAL HIGH (ref 70–99)
Glucose-Capillary: 94 mg/dL (ref 70–99)
Glucose-Capillary: 121 mg/dL — ABNORMAL HIGH (ref 70–99)
Glucose-Capillary: 116 mg/dL — ABNORMAL HIGH (ref 70–99)
Glucose-Capillary: 81 mg/dL (ref 70–99)

## 2019-12-25 LAB — MAGNESIUM: Magnesium: 1.8 mg/dL (ref 1.7–2.4)

## 2019-12-25 LAB — T4, FREE: Free T4: 1.12 ng/dL (ref 0.61–1.12)

## 2019-12-25 LAB — TSH: TSH: 0.341 u[IU]/mL — ABNORMAL LOW (ref 0.350–4.500)

## 2019-12-25 MED ORDER — POTASSIUM CHLORIDE CRYS ER 20 MEQ PO TBCR
40.0000 meq | EXTENDED_RELEASE_TABLET | Freq: Once | ORAL | Status: AC
  Administered 2019-12-25: 40 meq via ORAL
  Filled 2019-12-25: qty 2

## 2019-12-25 MED ORDER — METOPROLOL TARTRATE 12.5 MG HALF TABLET: 12.5000 mg | ORAL_TABLET | Freq: Two times a day (BID) | ORAL | Status: DC

## 2019-12-25 MED ORDER — MAGNESIUM SULFATE 2 GM/50ML IV SOLN
2.0000 g | Freq: Once | INTRAVENOUS | Status: AC
  Administered 2019-12-25: 2 g via INTRAVENOUS
  Filled 2019-12-25: qty 50

## 2019-12-25 MED ORDER — FUROSEMIDE 10 MG/ML IJ SOLN
80.0000 mg | Freq: Once | INTRAMUSCULAR | Status: AC
  Administered 2019-12-25: 80 mg via INTRAVENOUS
  Filled 2019-12-25: qty 8

## 2019-12-25 MED ORDER — MAGNESIUM SULFATE 2 GM/50ML IV SOLN: 2.0000 g | Freq: Once | INTRAVENOUS | Status: DC

## 2019-12-25 MED ORDER — METOPROLOL SUCCINATE ER 25 MG PO TB24
25.0000 mg | ORAL_TABLET | Freq: Every day | ORAL | Status: DC
  Administered 2019-12-25 – 2019-12-26 (×2): 25 mg via ORAL
  Filled 2019-12-25 (×2): qty 1

## 2019-12-25 NOTE — Progress Notes (Signed)
Progress Note    Savannah Morales  PPJ:[id number] DOB: 1966-01-21  DOA: 12/23/2019 PCP: Ardith Dark, MD    Brief Narrative:  Medical records reviewed and are as summarized below:  Savannah Morales is an 54 y.o. female with medical history significant of HTN, IIDM, presented with increasing SOB for 4+ weeks.  Patient has been encouraged overall health status until about 4 weeks ago, started to feel short of breath associated with exertion.  At the beginning she was able to walk 15 to 20 minutes before feeling short of breath but soon she started to have this dry cough turned into productive to clear sputum.  And increasingly she started to feel short of breath more frequently now even with minimal movement she is out of breath easily.  Denies any fever chills no chest pains.  She was seen by her PCP on virtual visit 3 weeks ago was diagnosed with bronchitis, and she was on cough medications. Despite the PRN cough meds, her symptoms not improving. She also developed a bilateral ankle swelling for about one week.  Assessment/Plan:   Active Problems:   Hypertension associated with diabetes (HCC)   Type 2 diabetes mellitus (HCC)   CHF (congestive heart failure) (HCC)   Acute systolic CHF (congestive heart failure) (HCC)   Pulmonary HTN (HCC)   ? Hyperthyroidism -TSH was normal in July now 0.341 -check free t4/freet3 -no biotin on home med list  New onset systolic CHF -echo: 30 to 35%. The  left ventricle has moderately decreased function. The left ventricle  demonstrates global hypokinesis. The left ventricular internal cavity size was moderately dilated. Left ventricular diastolic parameters are consistent with Grade I diastolic dysfunction (impaired relaxation).  -tele with PVC and v tach  Pulmonary HTN -CTA showed cardiomegaly and signs of pulmonary HTN. -cardiology consult  HTN -BP borderline low: BB started upon admission-- will decrease to half dose -d/c norvasc -hold on  further lasix due to hypotension  DM type 2 -SSI HgbA1c: 6.6   Family Communication/Anticipated D/C date and plan/Code Status   DVT prophylaxis: Lovenox ordered. Code Status: Full Code.  Disposition Plan: Status is: Inpatient  Remains inpatient appropriate because:Inpatient level of care appropriate due to severity of illness   Dispo: The patient is from: Home              Anticipated d/c is to: Home              Anticipated d/c date is: 2 days              Patient currently is not medically stable to d/c.         Medical Consultants:    cards    Subjective:   Cough improved today  Objective:    Vitals:   12/24/19 1642 12/25/19 0048 12/25/19 0533 12/25/19 0910  BP: 115/82 100/67 122/85 99/78  Pulse: (!) 110 (!) 104 98 71  Resp: 18 18 18 18   Temp: 98.5 F (36.9 C) 98.2 F (36.8 C) 98.1 F (36.7 C) 98 F (36.7 C)  TempSrc: Oral Oral Oral Oral  SpO2: 100% 95% 95%   Weight:   85.4 kg 84.7 kg  Height:    5\' 8"  (1.727 m)    Intake/Output Summary (Last 24 hours) at 12/25/2019 1055 Last data filed at 12/25/2019 0916 Gross per 24 hour  Intake 503 ml  Output --  Net 503 ml   Filed Weights   12/23/19 1822 12/25/19 0533 12/25/19 0910  Weight: 88.5 kg 85.4 kg 84.7 kg    Exam:  General: Appearance:     Overweight female in no acute distress     Lungs:     Faint crackles at bases, respirations unlabored  Heart:    Normal heart rate. Normal rhythm. Tele: PVCs, few runs v tach  MS:   All extremities are intact.   Neurologic:   Awake, alert, oriented x 3. No apparent focal neurological           defect.     Data Reviewed:   I have personally reviewed following labs and imaging studies:  Labs: Labs show the following:   Basic Metabolic Panel: Recent Labs  Lab 12/23/19 1841 12/24/19 0734 12/25/19 0807  NA 140  --  141  K 3.9  --  3.5  CL 108  --  106  CO2 22  --  22  GLUCOSE 102*  --  172*  BUN 10  --  14  CREATININE 0.85  --  0.88   CALCIUM 8.8*  --  9.4  MG  --  2.1 1.8   GFR Estimated Creatinine Clearance: 85.2 mL/min (by C-G formula based on SCr of 0.88 mg/dL). Liver Function Tests: Recent Labs  Lab 12/23/19 1841  AST 43*  ALT 49*  ALKPHOS 45  BILITOT 0.8  PROT 6.6  ALBUMIN 3.7   No results for input(s): LIPASE, AMYLASE in the last 168 hours. No results for input(s): AMMONIA in the last 168 hours. Coagulation profile No results for input(s): INR, PROTIME in the last 168 hours.  CBC: Recent Labs  Lab 12/23/19 1841  WBC 6.2  HGB 12.1  HCT 38.3  MCV 83.1  PLT 199   Cardiac Enzymes: No results for input(s): CKTOTAL, CKMB, CKMBINDEX, TROPONINI in the last 168 hours. BNP (last 3 results) No results for input(s): PROBNP in the last 8760 hours. CBG: Recent Labs  Lab 12/24/19 1641 12/25/19 0611  GLUCAP 137* 140*   D-Dimer: No results for input(s): DDIMER in the last 72 hours. Hgb A1c: Recent Labs    12/24/19 1710  HGBA1C 6.6*   Lipid Profile: Recent Labs    12/25/19 0807  CHOL 160  HDL 50  LDLCALC 98  TRIG 60  CHOLHDL 3.2   Thyroid function studies: Recent Labs    12/25/19 0807  TSH 0.341*   Anemia work up: No results for input(s): VITAMINB12, FOLATE, FERRITIN, TIBC, IRON, RETICCTPCT in the last 72 hours. Sepsis Labs: Recent Labs  Lab 12/23/19 1841  WBC 6.2    Microbiology Recent Results (from the past 240 hour(s))  SARS Coronavirus 2 by RT PCR (hospital order, performed in [facility] hospital lab) Nasopharyngeal Nasopharyngeal Swab     Status: None   Collection Time: 12/23/19  6:38 PM   Specimen: Nasopharyngeal Swab  Result Value Ref Range Status   SARS Coronavirus 2 NEGATIVE NEGATIVE Final    Comment: (NOTE) SARS-CoV-2 target nucleic acids are NOT DETECTED.  The SARS-CoV-2 RNA is generally detectable in upper and lower respiratory specimens during the acute phase of infection. The lowest concentration of SARS-CoV-2 viral copies this assay can detect is  250 copies / mL. A negative result does not preclude SARS-CoV-2 infection and should not be used as the sole basis for treatment or other patient management decisions.  A negative result may occur with improper specimen collection / handling, submission of specimen other than nasopharyngeal swab, presence of viral mutation(s) within the areas targeted by this assay, and inadequate  number of viral copies (<250 copies / mL). A negative result must be combined with clinical observations, patient history, and epidemiological information.  Fact Sheet for Patients:   BoilerBrush.com.cy  Fact Sheet for Healthcare Providers: https://pope.com/  This test is not yet approved or  cleared by the [place] FDA and has been authorized for detection and/or diagnosis of SARS-CoV-2 by FDA under an Emergency Use Authorization (EUA).  This EUA will remain in effect (meaning this test can be used) for the duration of the COVID-19 declaration under Section 564(b)(1) of the Act, 21 U.S.C. section 360bbb-3(b)(1), unless the authorization is terminated or revoked sooner.  Performed at [facility], [address], [place], [place] [address]     Procedures and diagnostic studies:  DG Chest 2 View  Result Date: 12/23/2019 CLINICAL DATA:  Shortness of breath for 1 month complaining by dry cough, hypertension controlled by medication, diabetes mellitus on metformin EXAM: CHEST - 2 VIEW COMPARISON:  None FINDINGS: Enlargement of cardiac silhouette. Mediastinal contours and pulmonary vascularity normal. Retrocardiac LEFT lower lobe opacity with loss of LEFT diaphragmatic silhouette likely representing pneumonia. Remaining lungs clear. No definite pleural effusion or pneumothorax. Mild broad-based levoconvex thoracic scoliosis. IMPRESSION: LEFT lower lobe pneumonia. Enlargement of cardiac silhouette. Electronically Signed   By: Ulyses Southward M.D.   On:  12/23/2019 19:22   CT Angio Chest PE W and/or Wo Contrast  Result Date: 12/24/2019 CLINICAL DATA:  Chest pain and shortness of breath. EXAM: CT ANGIOGRAPHY CHEST WITH CONTRAST TECHNIQUE: Multidetector CT imaging of the chest was performed using the standard protocol during bolus administration of intravenous contrast. Multiplanar CT image reconstructions and MIPs were obtained to evaluate the vascular anatomy. CONTRAST:  15mL OMNIPAQUE IOHEXOL 350 MG/ML SOLN COMPARISON:  Chest x-ray December 23, 2019 FINDINGS: Cardiovascular: Cardiomegaly. No coronary artery calcifications are identified. There is poor opacification of the thoracic aorta limiting evaluation but no aneurysm is seen. No atherosclerotic change. The lack of opacification of the thoracic aorta precludes evaluation for a dissection. The main pulmonary artery measures 3.7 cm. No left-sided pulmonary emboli. Evaluation of the right upper lobe pulmonary arterial branches is significantly limited due to streak artifact secondary to dense contrast opacifications the SVC. Apparent filling defects in central upper lobe branches are thought to be due to the streak artifact. Evaluation of the central right lower lobe branches is also limited due to streak artifact. For example, an apparent filling defects seen on series 7, image 194 correlates with streak artifact. Mediastinum/Nodes: The chest wall is normal. Tiny pleural effusions are seen. The esophagus is normal. No adenopathy. The thyroid is normal. Lungs/Pleura: The central airways are normal. Bronchial wall thickening is seen in the bases bilaterally. Mild interlobular septal thickening is seen in the apices. Mild scattered ground-glass is seen. There is an apparent nodule posteriorly on the right on series 6, image 81 measuring 5 mm. No suspicious masses. Upper Abdomen: No acute abnormality. Musculoskeletal: No chest wall abnormality. No acute or significant osseous findings. Review of the MIP images  confirms the above findings. IMPRESSION: 1. Evaluation for pulmonary emboli in the right upper lobe and the central right lower lobe is limited due to streak artifact secondary to dense contrast in the SVC. Apparent filling defects in these regions are thought to be due to this artifact. Within this limitation, no convincing evidence of pulmonary emboli. 2. Cardiomegaly, mild edema, and tiny pleural effusions. 3. Bronchial wall thickening in the bases suggesting bronchitis. 4. The main pulmonary artery  measures 3.7 cm raising the possibility of pulmonary arterial hypertension. 5. 5 mm nodule in the right lung. No follow-up needed if patient is low-risk. Non-contrast chest CT can be considered in 12 months if patient is high-risk. This recommendation follows the consensus statement: Guidelines for Management of Incidental Pulmonary Nodules Detected on CT Images: From the Fleischner Society 2017; Radiology 2017; 284:228-243. Electronically Signed   By: Gerome Sam III M.D   On: 12/24/2019 09:47   DG Chest Port 1 View  Result Date: 12/25/2019 CLINICAL DATA:  CHF. EXAM: PORTABLE CHEST 1 VIEW COMPARISON:  12/23/2019 FINDINGS: 0452 hours. The cardio pericardial silhouette is enlarged. No substantial vascular congestion. No pulmonary edema. No focal consolidation. No effusion. The visualized bony structures of the thorax show now acute abnormality. Telemetry leads overlie the chest. IMPRESSION: Cardiomegaly without acute cardiopulmonary findings. Electronically Signed   By: Kennith Center M.D.   On: 12/25/2019 06:59   ECHOCARDIOGRAM COMPLETE  Result Date: 12/24/2019    ECHOCARDIOGRAM REPORT   Patient Name:   Savannah Morales Date of Exam: 12/24/2019 Medical Rec #:  [id number]       Height:       68.0 in Accession #:    [id number]      Weight:       195.0 lb Date of Birth:  1965/09/29       BSA:          2.022 m Patient Age:    54 years        BP:           111/77 mmHg Patient Gender: F               HR:            106 bpm. Exam Location:  Inpatient Procedure: 2D Echo Indications:    acute systolic chf 428.21  History:        Patient has no prior history of Echocardiogram examinations.                 Signs/Symptoms:ankle swelling; Risk Factors:Hypertension and                 Diabetes.  Sonographer:    Delcie Roch Referring Phys: [id number] PING T ZHANG IMPRESSIONS  1. Left ventricular ejection fraction, by estimation, is 30 to 35%. The left ventricle has moderately decreased function. The left ventricle demonstrates global hypokinesis. The left ventricular internal cavity size was moderately dilated. Left ventricular diastolic parameters are consistent with Grade I diastolic dysfunction (impaired relaxation).  2. Right ventricular systolic function is normal. The right ventricular size is normal. There is normal pulmonary artery systolic pressure.  3. Left atrial size was severely dilated.  4. The mitral valve is normal in structure. Moderate mitral valve regurgitation. No evidence of mitral stenosis.  5. The aortic valve is normal in structure. Aortic valve regurgitation is not visualized. No aortic stenosis is present.  6. The inferior vena cava is normal in size with greater than 50% respiratory variability, suggesting right atrial pressure of 3 mmHg. FINDINGS  Left Ventricle: Left ventricular ejection fraction, by estimation, is 30 to 35%. The left ventricle has moderately decreased function. The left ventricle demonstrates global hypokinesis. The left ventricular internal cavity size was moderately dilated. There is no left ventricular hypertrophy. Left ventricular diastolic parameters are consistent with Grade I diastolic dysfunction (impaired relaxation). Right Ventricle: The right ventricular size is normal. No increase in right ventricular wall thickness. Right ventricular systolic function is  normal. There is normal pulmonary artery systolic pressure. The tricuspid regurgitant velocity is 2.47 m/s, and  with an  assumed right atrial pressure of 3 mmHg, the estimated right ventricular systolic pressure is 27.4 mmHg. Left Atrium: Left atrial size was severely dilated. Right Atrium: Right atrial size was normal in size. Pericardium: There is no evidence of pericardial effusion. Mitral Valve: The mitral valve is normal in structure. There is mild thickening of the mitral valve leaflet(s). Normal mobility of the mitral valve leaflets. Moderate mitral valve regurgitation. No evidence of mitral valve stenosis. Tricuspid Valve: The tricuspid valve is normal in structure. Tricuspid valve regurgitation is trivial. No evidence of tricuspid stenosis. Aortic Valve: The aortic valve is normal in structure. Aortic valve regurgitation is not visualized. No aortic stenosis is present. Pulmonic Valve: The pulmonic valve was normal in structure. Pulmonic valve regurgitation is trivial. No evidence of pulmonic stenosis. Aorta: The aortic root is normal in size and structure. Venous: The inferior vena cava is normal in size with greater than 50% respiratory variability, suggesting right atrial pressure of 3 mmHg. IAS/Shunts: No atrial level shunt detected by color flow Doppler.  LEFT VENTRICLE PLAX 2D LVIDd:         6.80 cm Diastology LVIDs:         5.70 cm LV e' lateral: 7.94 cm/s LV PW:         1.00 cm LV e' medial:  5.98 cm/s LV IVS:        0.70 cm  RIGHT VENTRICLE            IVC RV S prime:     9.79 cm/s  IVC diam: 2.00 cm TAPSE (M-mode): 2.3 cm LEFT ATRIUM              Index       RIGHT ATRIUM           Index LA diam:        4.60 cm  2.28 cm/m  RA Area:     15.90 cm LA Vol (A2C):   81.8 ml  40.46 ml/m RA Volume:   39.70 ml  19.64 ml/m LA Vol (A4C):   101.0 ml 49.95 ml/m LA Biplane Vol: 91.1 ml  45.06 ml/m  AORTIC VALVE LVOT Vmax:   76.40 cm/s LVOT Vmean:  52.900 cm/s LVOT VTI:    0.110 m  AORTA Ao Asc diam: 2.90 cm MR Peak grad:    85.0 mmHg   TRICUSPID VALVE MR Mean grad:    55.0 mmHg   TR Peak grad:   24.4 mmHg MR Vmax:          461.00 cm/s TR Vmax:        247.00 cm/s MR Vmean:        351.0 cm/s MR PISA:         0.25 cm    SHUNTS MR PISA Eff ROA: 2 mm       Systemic VTI: 0.11 m MR PISA Radius:  0.20 cm Donato Schultz MD Electronically signed by Donato Schultz MD Signature Date/Time: 12/24/2019/5:06:44 PM    Final     Medications:   . enoxaparin (LOVENOX) injection  40 mg Subcutaneous Q24H  . insulin aspart  0-15 Units Subcutaneous TID WC  . metoprolol tartrate  25 mg Oral BID  . sodium chloride flush  3 mL Intravenous Q12H   Continuous Infusions: . sodium chloride    . magnesium sulfate bolus IVPB       LOS: 1  day   Joseph Art  [facility]   How to contact the [facility] Attending or Consulting provider 7A - 7P or covering provider during after hours 7P -7A, for this patient?  1. Check the care team in [facility] and look for a) attending/consulting [facility] provider listed and b) the [facility] team listed 2. Log into [email or web address] and use [facility]'s universal password to access. If you do not have the password, please contact the hospital operator. 3. Locate the [facility] provider you are looking for under [facility] and page to a number that you can be directly reached. 4. If you still have difficulty reaching the provider, please page the [facility] (Director on Call) for the Hospitalists listed on amion for assistance.  12/25/2019, 10:55 AM

## 2019-12-25 NOTE — Progress Notes (Signed)
   12/25/19 0048  Assess: MEWS Score  Temp 98.2 F (36.8 C)  BP 100/67  Pulse Rate (!) 104  Resp 18  SpO2 95 %  O2 Device Room Air  Assess: MEWS Score  MEWS Temp 0  MEWS Systolic 1  MEWS Pulse 1  MEWS RR 0  MEWS LOC 0  MEWS Score 2  MEWS Score Color Yellow  Assess: if the MEWS score is Yellow or Red  Were vital signs taken at a resting state? Yes  Focused Assessment No change from prior assessment  Early Detection of Sepsis Score *See Row Information* Low  MEWS guidelines implemented *See Row Information* No, vital signs rechecked  Treat  MEWS Interventions Other (Comment) (MD notified of increase in pulse. chest xray ordered)

## 2019-12-25 NOTE — Progress Notes (Signed)
   12/25/19 0048  Assess: MEWS Score  Temp 98.2 F (36.8 C)  BP 100/67  Pulse Rate (!) 104  Resp 18  SpO2 95 %  O2 Device Room Air  Assess: MEWS Score  MEWS Temp 0  MEWS Systolic 1  MEWS Pulse 1  MEWS RR 0  MEWS LOC 0  MEWS Score 2  MEWS Score Color Yellow  Assess: if the MEWS score is Yellow or Red  Were vital signs taken at a resting state? Yes  Focused Assessment No change from prior assessment  Early Detection of Sepsis Score *See Row Information* Low  MEWS guidelines implemented *See Row Information* No, vital signs rechecked

## 2019-12-25 NOTE — Progress Notes (Signed)
MD called back and besides chest xray, no other orders for increasing pulse. Monitoring continues

## 2019-12-25 NOTE — Consult Note (Signed)
Cardiology Consultation:   Patient ID: Savannah Morales MRN: [id number]; DOB: 1965-10-04  Admit date: 12/23/2019 Date of Consult: 12/25/2019  Primary Care Provider: Ardith Dark, MD [facility] Cardiologist: Reatha Harps, MD [facility] Electrophysiologist:  None    Patient Profile:   Savannah Morales is a 54 y.o. female with a hx of DM, HTN, COVID vaccine 08/2019, who is being seen today for the evaluation of CHF at the request of Dr Benjamine Mola.  History of Present Illness:   Savannah Morales has no hx of CAD. At last PCP visit, 07/30, pt c/o cough, was treated for viral URI.   Savannah Morales was admitted 08/29 with worsening SOB, no CP, EF 35% on echo, cards asked to see.   Savannah Morales has never had chest pain.  She does not exercise.  She does not climb stairs.  She walks the dog, but does not think she walks a mile at a time.  She does housework and strenuous cleaning is probably the most strenuous thing that she does.  Her symptoms basically started when she was traveling in [place] to visit her grandkids.  She was having a cough that is nonproductive and increasing dyspnea on exertion.  She was not having fevers or chills.  None of the children or grandchildren were sick.  After she got back, she had a televisit on 07/30.  She was prescribed cough medicine and Tessalon Perles.  She stated these medicines helped the symptoms, but the symptoms worsened again when she quit taking them.  Her dyspnea on exertion worsened to the point that she would get short of breath doing minor activities around the house.  She has some mild left lower extremity edema because of a previous ankle fracture, that did not change very much.  She describes occasional PND and was also having orthopnea.  All of her symptoms have improved since coming to the hospital.  She does not have any palpitations or any history of presyncope or syncope.  She is having frequent ventricular ectopy on telemetry, but is not aware of  it.  She does not add salt to foods and has been making an effort to avoid intrinsic sodium in foods ever since she was diagnosed with high blood pressure.   Past Medical History:  Diagnosis Date  . Diabetes mellitus without complication (HCC)    Type II  . History of chicken pox   . Hypertension   . Kidney stones     Past Surgical History:  Procedure Laterality Date  . ANKLE SURGERY       Home Medications:  Prior to Admission medications   Medication Sig Start Date End Date Taking? Authorizing Provider  acetaminophen (TYLENOL) 500 MG tablet Take 1,000 mg by mouth every 6 (six) hours as needed for mild pain.   Yes [provider]  amLODipine (NORVASC) 10 MG tablet Take 1 tablet (10 mg total) by mouth daily. 05/04/19 12/24/19 Yes Ardith Dark, MD  benzoyl peroxide-erythromycin [facility]) gel Apply 1 application topically daily.  11/08/19  Yes [provider]  ELDERBERRY PO Take 2 tablets by mouth daily.    Yes [provider]  Garlic 1000 MG CAPS Take 1 capsule by mouth daily.    Yes [provider]  metFORMIN (GLUCOPHAGE-XR) 500 MG 24 hr tablet TAKE 1 TABLET BY MOUTH DAILY WITH BREAKFAST Patient taking differently: Take 500 mg by mouth daily with breakfast.  07/20/19  Yes Ardith Dark, MD  Omega-3 Fatty Acids (FISH  OIL) 1200 MG CPDR Take 2 capsules by mouth daily.    Yes [provider]  benzonatate (TESSALON) 100 MG capsule Take 1 capsule (100 mg total) by mouth 3 (three) times daily as needed for cough. Patient not taking: Reported on 12/24/2019 11/24/19   Madelin Headings, MD  promethazine-dextromethorphan (PROMETHAZINE-DM) 6.25-15 MG/5ML syrup Take 5 mLs by mouth every 6 (six) hours as needed for cough (at night). Patient not taking: Reported on 12/24/2019 11/24/19   Madelin Headings, MD    Inpatient Medications: Scheduled Meds: . enoxaparin (LOVENOX) injection  40 mg Subcutaneous Q24H  . insulin aspart  0-15 Units Subcutaneous TID  WC  . metoprolol tartrate  12.5 mg Oral BID  . sodium chloride flush  3 mL Intravenous Q12H   Continuous Infusions: . sodium chloride    . magnesium sulfate bolus IVPB     PRN Meds: sodium chloride, acetaminophen, ondansetron (ZOFRAN) IV, sodium chloride flush  Allergies:   No Known Allergies  Social History:   Social History   Socioeconomic History  . Marital status: Divorced    Spouse name: Not on file  . Number of children: 3  . Years of education: 57  . Highest education level: Not on file  Occupational History  . Occupation: Actor   Tobacco Use  . Smoking status: Never Smoker  . Smokeless tobacco: Never Used  Vaping Use  . Vaping Use: Never used  Substance and Sexual Activity  . Alcohol use: Not Currently    Comment: Rarely  . Drug use: No  . Sexual activity: Yes  Other Topics Concern  . Not on file  Social History Narrative  . Not on file   Social Determinants of Health   Financial Resource Strain:   . Difficulty of Paying Living Expenses: Not on file  Food Insecurity:   . Worried About Programme researcher, broadcasting/film/video in the Last Year: Not on file  . Ran Out of Food in the Last Year: Not on file  Transportation Needs:   . Lack of Transportation (Medical): Not on file  . Lack of Transportation (Non-Medical): Not on file  Physical Activity:   . Days of Exercise per Week: Not on file  . Minutes of Exercise per Session: Not on file  Stress:   . Feeling of Stress : Not on file  Social Connections:   . Frequency of Communication with Friends and Family: Not on file  . Frequency of Social Gatherings with Friends and Family: Not on file  . Attends Religious Services: Not on file  . Active Member of Clubs or Organizations: Not on file  . Attends Banker Meetings: Not on file  . Marital Status: Not on file  Intimate Partner Violence:   . Fear of Current or Ex-Partner: Not on file  . Emotionally Abused: Not on file  . Physically  Abused: Not on file  . Sexually Abused: Not on file    Family History:   Family History  Problem Relation Age of Onset  . Hyperlipidemia Mother   . Hypertension Father   . Diabetes Father   . Cancer Paternal Grandmother     Family Status  Relation Name Status  . Mother  Alive  . Father  Alive  . PGM  Deceased  . PGF  Deceased   ROS:  Please see the history of present illness.  All other ROS reviewed and negative.     Physical Exam/Data:   Vitals:   12/24/19  1642 12/25/19 0048 12/25/19 0533 12/25/19 0910  BP: 115/82 100/67 122/85 99/78  Pulse: (!) 110 (!) 104 98 71  Resp: 18 18 18 18   Temp: 98.5 F (36.9 C) 98.2 F (36.8 C) 98.1 F (36.7 C) 98 F (36.7 C)  TempSrc: Oral Oral Oral Oral  SpO2: 100% 95% 95%   Weight:   85.4 kg 84.7 kg  Height:    5\' 8"  (1.727 m)    Intake/Output Summary (Last 24 hours) at 12/25/2019 1208 Last data filed at 12/25/2019 1208 Gross per 24 hour  Intake 243 ml  Output --  Net 243 ml   Last 3 Weights 12/25/2019 12/25/2019 12/23/2019  Weight (lbs) 186 lb 11.7 oz 188 lb 3.2 oz 195 lb  Weight (kg) 84.7 kg 85.367 kg 88.451 kg     Body mass index is 28.39 kg/m.  General:  Well nourished, well developed, in no acute distress HEENT: normal Lymph: no adenopathy Neck: no JVD Endocrine:  No thryomegaly Vascular: No carotid bruits; FA pulses 2+ bilaterally without bruits  Cardiac:  normal S1, S2; RRR; no murmur  Lungs: Decreased breath sounds bases bilaterally, no wheezing, rhonchi, few rales bases Abd: soft, nontender, no hepatomegaly  Ext: no edema Musculoskeletal:  No deformities, BUE and BLE strength normal and equal Skin: warm and dry  Neuro:  CNs 2-12 intact, no focal abnormalities noted Psych:  Normal affect   EKG:  The EKG was personally reviewed and demonstrates: 8/29 ECG is sinus tach, heart rate 106, PVCs seen, possible LVH Telemetry:  Telemetry was personally reviewed and demonstrates: Sinus rhythm with NSVT runs up to 14 beats,  frequent ventricular ectopy and periods of bigeminy  Relevant CV Studies:  ECHO: 12/24/2019 1. Left ventricular ejection fraction, by estimation, is 30 to 35%. The  left ventricle has moderately decreased function. The left ventricle  demonstrates global hypokinesis. The left ventricular internal cavity size  was moderately dilated. Left ventricular diastolic parameters are consistent with Grade I diastolic dysfunction (impaired relaxation).  2. Right ventricular systolic function is normal. The right ventricular  size is normal. There is normal pulmonary artery systolic pressure.  3. Left atrial size was severely dilated.  4. The mitral valve is normal in structure. Moderate mitral valve  regurgitation. No evidence of mitral stenosis.  5. The aortic valve is normal in structure. Aortic valve regurgitation is  not visualized. No aortic stenosis is present.  6. The inferior vena cava is normal in size with greater than 50%  respiratory variability, suggesting right atrial pressure of 3 mmHg.   Laboratory Data:  High Sensitivity Troponin:   Recent Labs  Lab 12/24/19 0734 12/24/19 1024  TROPONINIHS 20* 24*     Chemistry Recent Labs  Lab 12/23/19 1841 12/25/19 0807  NA 140 141  K 3.9 3.5  CL 108 106  CO2 22 22  GLUCOSE 102* 172*  BUN 10 14  CREATININE 0.85 0.88  CALCIUM 8.8* 9.4  GFRNONAA >60 >60  GFRAA >60 >60  ANIONGAP 10 13    Recent Labs  Lab 12/23/19 1841  PROT 6.6  ALBUMIN 3.7  AST 43*  ALT 49*  ALKPHOS 45  BILITOT 0.8   Magnesium  Date Value Ref Range Status  12/25/2019 1.8 1.7 - 2.4 mg/dL Final    Comment:    Performed at [facility], [address] [place], [address] [place]   Hematology Recent Labs  Lab 12/23/19 1841  WBC 6.2  RBC 4.61  HGB 12.1  HCT  38.3  MCV 83.1  MCH 26.2  MCHC 31.6  RDW 15.2  PLT 199   BNP Recent Labs  Lab 12/24/19 0735  BNP 607.9*    TSH  Date Value Ref Range Status  12/25/2019 0.341 (L) 0.350  - 4.500 uIU/mL Final    Comment:    Performed by a 3rd Generation assay with a functional sensitivity of <=0.01 uIU/mL. Performed at [facility], [address], [place], [place] [address]   11/13/2019 1.10 0.35 - 4.50 uIU/mL Final  11/10/2018 0.49 0.35 - 4.50 uIU/mL Final  04/14/2017 0.49 0.35 - 4.50 uIU/mL Final   Lab Results  Component Value Date   HGBA1C 6.6 (H) 12/24/2019   Lab Results  Component Value Date   CHOL 160 12/25/2019   HDL 50 12/25/2019   LDLCALC 98 12/25/2019   TRIG 60 12/25/2019   CHOLHDL 3.2 12/25/2019   Radiology/Studies:  DG Chest 2 View  Result Date: 12/23/2019 CLINICAL DATA:  Shortness of breath for 1 month complaining by dry cough, hypertension controlled by medication, diabetes mellitus on metformin EXAM: CHEST - 2 VIEW COMPARISON:  None FINDINGS: Enlargement of cardiac silhouette. Mediastinal contours and pulmonary vascularity normal. Retrocardiac LEFT lower lobe opacity with loss of LEFT diaphragmatic silhouette likely representing pneumonia. Remaining lungs clear. No definite pleural effusion or pneumothorax. Mild broad-based levoconvex thoracic scoliosis. IMPRESSION: LEFT lower lobe pneumonia. Enlargement of cardiac silhouette. Electronically Signed   By: Ulyses Southward M.D.   On: 12/23/2019 19:22   CT Angio Chest PE W and/or Wo Contrast  Result Date: 12/24/2019 CLINICAL DATA:  Chest pain and shortness of breath. EXAM: CT ANGIOGRAPHY CHEST WITH CONTRAST TECHNIQUE: Multidetector CT imaging of the chest was performed using the standard protocol during bolus administration of intravenous contrast. Multiplanar CT image reconstructions and MIPs were obtained to evaluate the vascular anatomy. CONTRAST:  64mL OMNIPAQUE IOHEXOL 350 MG/ML SOLN COMPARISON:  Chest x-ray December 23, 2019 FINDINGS: Cardiovascular: Cardiomegaly. No coronary artery calcifications are identified. There is poor opacification of the thoracic aorta limiting evaluation but no aneurysm is  seen. No atherosclerotic change. The lack of opacification of the thoracic aorta precludes evaluation for a dissection. The main pulmonary artery measures 3.7 cm. No left-sided pulmonary emboli. Evaluation of the right upper lobe pulmonary arterial branches is significantly limited due to streak artifact secondary to dense contrast opacifications the SVC. Apparent filling defects in central upper lobe branches are thought to be due to the streak artifact. Evaluation of the central right lower lobe branches is also limited due to streak artifact. For example, an apparent filling defects seen on series 7, image 194 correlates with streak artifact. Mediastinum/Nodes: The chest wall is normal. Tiny pleural effusions are seen. The esophagus is normal. No adenopathy. The thyroid is normal. Lungs/Pleura: The central airways are normal. Bronchial wall thickening is seen in the bases bilaterally. Mild interlobular septal thickening is seen in the apices. Mild scattered ground-glass is seen. There is an apparent nodule posteriorly on the right on series 6, image 81 measuring 5 mm. No suspicious masses. Upper Abdomen: No acute abnormality. Musculoskeletal: No chest wall abnormality. No acute or significant osseous findings. Review of the MIP images confirms the above findings. IMPRESSION: 1. Evaluation for pulmonary emboli in the right upper lobe and the central right lower lobe is limited due to streak artifact secondary to dense contrast in the SVC. Apparent filling defects in these regions are thought to be due to this artifact. Within this limitation, no convincing evidence  of pulmonary emboli. 2. Cardiomegaly, mild edema, and tiny pleural effusions. 3. Bronchial wall thickening in the bases suggesting bronchitis. 4. The main pulmonary artery measures 3.7 cm raising the possibility of pulmonary arterial hypertension. 5. 5 mm nodule in the right lung. No follow-up needed if patient is low-risk. Non-contrast chest CT can be  considered in 12 months if patient is high-risk. This recommendation follows the consensus statement: Guidelines for Management of Incidental Pulmonary Nodules Detected on CT Images: From the Fleischner Society 2017; Radiology 2017; 284:228-243. Electronically Signed   By: Gerome Sam III M.D   On: 12/24/2019 09:47   DG Chest Port 1 View  Result Date: 12/25/2019 CLINICAL DATA:  CHF. EXAM: PORTABLE CHEST 1 VIEW COMPARISON:  12/23/2019 FINDINGS: 0452 hours. The cardio pericardial silhouette is enlarged. No substantial vascular congestion. No pulmonary edema. No focal consolidation. No effusion. The visualized bony structures of the thorax show now acute abnormality. Telemetry leads overlie the chest. IMPRESSION: Cardiomegaly without acute cardiopulmonary findings. Electronically Signed   By: Kennith Center M.D.   On: 12/25/2019 06:59   ECHOCARDIOGRAM COMPLETE  Result Date: 12/24/2019    ECHOCARDIOGRAM REPORT   Patient Name:   Savannah Morales Date of Exam: 12/24/2019 Medical Rec #:  [id number]       Height:       68.0 in Accession #:    [id number]      Weight:       195.0 lb Date of Birth:  February 10, 1966       BSA:          2.022 m Patient Age:    54 years        BP:           111/77 mmHg Patient Gender: F               HR:           106 bpm. Exam Location:  Inpatient Procedure: 2D Echo Indications:    acute systolic chf 428.21  History:        Patient has no prior history of Echocardiogram examinations.                 Signs/Symptoms:ankle swelling; Risk Factors:Hypertension and                 Diabetes.  Sonographer:    Delcie Roch Referring Phys: [id number] PING T ZHANG IMPRESSIONS  1. Left ventricular ejection fraction, by estimation, is 30 to 35%. The left ventricle has moderately decreased function. The left ventricle demonstrates global hypokinesis. The left ventricular internal cavity size was moderately dilated. Left ventricular diastolic parameters are consistent with Grade I diastolic dysfunction  (impaired relaxation).  2. Right ventricular systolic function is normal. The right ventricular size is normal. There is normal pulmonary artery systolic pressure.  3. Left atrial size was severely dilated.  4. The mitral valve is normal in structure. Moderate mitral valve regurgitation. No evidence of mitral stenosis.  5. The aortic valve is normal in structure. Aortic valve regurgitation is not visualized. No aortic stenosis is present.  6. The inferior vena cava is normal in size with greater than 50% respiratory variability, suggesting right atrial pressure of 3 mmHg. FINDINGS  Left Ventricle: Left ventricular ejection fraction, by estimation, is 30 to 35%. The left ventricle has moderately decreased function. The left ventricle demonstrates global hypokinesis. The left ventricular internal cavity size was moderately dilated. There is no left ventricular hypertrophy. Left ventricular diastolic parameters are consistent with  Grade I diastolic dysfunction (impaired relaxation). Right Ventricle: The right ventricular size is normal. No increase in right ventricular wall thickness. Right ventricular systolic function is normal. There is normal pulmonary artery systolic pressure. The tricuspid regurgitant velocity is 2.47 m/s, and  with an assumed right atrial pressure of 3 mmHg, the estimated right ventricular systolic pressure is 27.4 mmHg. Left Atrium: Left atrial size was severely dilated. Right Atrium: Right atrial size was normal in size. Pericardium: There is no evidence of pericardial effusion. Mitral Valve: The mitral valve is normal in structure. There is mild thickening of the mitral valve leaflet(s). Normal mobility of the mitral valve leaflets. Moderate mitral valve regurgitation. No evidence of mitral valve stenosis. Tricuspid Valve: The tricuspid valve is normal in structure. Tricuspid valve regurgitation is trivial. No evidence of tricuspid stenosis. Aortic Valve: The aortic valve is normal in  structure. Aortic valve regurgitation is not visualized. No aortic stenosis is present. Pulmonic Valve: The pulmonic valve was normal in structure. Pulmonic valve regurgitation is trivial. No evidence of pulmonic stenosis. Aorta: The aortic root is normal in size and structure. Venous: The inferior vena cava is normal in size with greater than 50% respiratory variability, suggesting right atrial pressure of 3 mmHg. IAS/Shunts: No atrial level shunt detected by color flow Doppler.  LEFT VENTRICLE PLAX 2D LVIDd:         6.80 cm Diastology LVIDs:         5.70 cm LV e' lateral: 7.94 cm/s LV PW:         1.00 cm LV e' medial:  5.98 cm/s LV IVS:        0.70 cm  RIGHT VENTRICLE            IVC RV S prime:     9.79 cm/s  IVC diam: 2.00 cm TAPSE (M-mode): 2.3 cm LEFT ATRIUM              Index       RIGHT ATRIUM           Index LA diam:        4.60 cm  2.28 cm/m  RA Area:     15.90 cm LA Vol (A2C):   81.8 ml  40.46 ml/m RA Volume:   39.70 ml  19.64 ml/m LA Vol (A4C):   101.0 ml 49.95 ml/m LA Biplane Vol: 91.1 ml  45.06 ml/m  AORTIC VALVE LVOT Vmax:   76.40 cm/s LVOT Vmean:  52.900 cm/s LVOT VTI:    0.110 m  AORTA Ao Asc diam: 2.90 cm MR Peak grad:    85.0 mmHg   TRICUSPID VALVE MR Mean grad:    55.0 mmHg   TR Peak grad:   24.4 mmHg MR Vmax:         461.00 cm/s TR Vmax:        247.00 cm/s MR Vmean:        351.0 cm/s MR PISA:         0.25 cm    SHUNTS MR PISA Eff ROA: 2 mm       Systemic VTI: 0.11 m MR PISA Radius:  0.20 cm Donato Schultz MD Electronically signed by Donato Schultz MD Signature Date/Time: 12/24/2019/5:06:44 PM    Final    {  New York Heart Association (NYHA) Functional Class NYHA Class III  On admission, now Class II  Assessment and Plan:   1. Acute combined systolic and diastolic CHF: -She only has grade 1 diastolic dysfunction, mild -Her  EF is 30-35%, likely recent as her symptoms have only been going on for little over a month -In general, she takes care of herself, compliant with medications and  watching what she eats (although she does not exercise) - normal renal function - not on diuretic pta - discuss spiro 12.5 mg qd w/ MD -Change beta-blocker to Toprol-XL 25 mg daily and increase as tolerated  -Can also try adding low-dose ACE/ARB -Follow EF over time -No coronary calcifications seen on CT angiogram of the chest, MD because of any ischemic eval as needed  2.  Abnormal TSH -Her TSH was normal a month ago, and now is slightly low. -Free T3 and free T4 are pending -Per IM, follow-up on labs  3.  PVCs: -Dr. Flora Lipps has reviewed telemetry and ECGs. 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

## 2019-12-26 DIAGNOSIS — R7989 Other specified abnormal findings of blood chemistry: Secondary | ICD-10-CM

## 2019-12-26 DIAGNOSIS — R0600 Dyspnea, unspecified: Secondary | ICD-10-CM

## 2019-12-26 LAB — BASIC METABOLIC PANEL
Anion gap: 12 (ref 5–15)
BUN: 15 mg/dL (ref 6–20)
CO2: 22 mmol/L (ref 22–32)
Calcium: 9.2 mg/dL (ref 8.9–10.3)
Chloride: 107 mmol/L (ref 98–111)
Creatinine, Ser: 0.84 mg/dL (ref 0.44–1.00)
GFR calc Af Amer: >60 mL/min (ref >60)
GFR calc non Af Amer: >60 mL/min (ref >60)
Glucose, Bld: 97 mg/dL (ref 70–99)
Potassium: 3.5 mmol/L (ref 3.5–5.1)
Sodium: 141 mmol/L (ref 135–145)

## 2019-12-26 LAB — CBC
HCT: 37.5 % (ref 36.0–46.0)
Hemoglobin: 12.3 g/dL (ref 12.0–15.0)
MCH: 26.4 pg (ref 26.0–34.0)
MCHC: 32.8 g/dL (ref 30.0–36.0)
MCV: 80.5 fL (ref 80.0–100.0)
Platelets: 198 10*3/uL (ref 150–400)
RBC: 4.66 MIL/uL (ref 3.87–5.11)
RDW: 15 % (ref 11.5–15.5)
WBC: 5.6 10*3/uL (ref 4.0–10.5)
nRBC: 0 % (ref 0.0–0.2)

## 2019-12-26 LAB — GLUCOSE, CAPILLARY
Glucose-Capillary: 109 mg/dL — ABNORMAL HIGH (ref 70–99)
Glucose-Capillary: 154 mg/dL — ABNORMAL HIGH (ref 70–99)
Glucose-Capillary: 97 mg/dL (ref 70–99)
Glucose-Capillary: 146 mg/dL — ABNORMAL HIGH (ref 70–99)

## 2019-12-26 LAB — T3, FREE: T3, Free: 3.8 pg/mL (ref 2.0–4.4)

## 2019-12-26 LAB — MAGNESIUM: Magnesium: 1.9 mg/dL (ref 1.7–2.4)

## 2019-12-26 MED ORDER — MAGNESIUM SULFATE 2 GM/50ML IV SOLN
2.0000 g | Freq: Once | INTRAVENOUS | Status: AC
  Administered 2019-12-26: 2 g via INTRAVENOUS
  Filled 2019-12-26: qty 50

## 2019-12-26 MED ORDER — LISINOPRIL 5 MG PO TABS
2.5000 mg | ORAL_TABLET | Freq: Every day | ORAL | Status: DC
  Administered 2019-12-26 – 2019-12-27 (×2): 2.5 mg via ORAL
  Filled 2019-12-26 (×2): qty 1

## 2019-12-26 MED ORDER — METOPROLOL SUCCINATE ER 50 MG PO TB24
50.0000 mg | ORAL_TABLET | Freq: Every day | ORAL | Status: DC
  Administered 2019-12-27: 50 mg via ORAL
  Filled 2019-12-26: qty 1

## 2019-12-26 MED ORDER — METOPROLOL SUCCINATE ER 25 MG PO TB24
25.0000 mg | ORAL_TABLET | Freq: Once | ORAL | Status: AC
  Administered 2019-12-26: 25 mg via ORAL
  Filled 2019-12-26: qty 1

## 2019-12-26 NOTE — Progress Notes (Addendum)
Progress Note    Savannah Morales  ZOX:[id number] DOB: May 17, 1965  DOA: 12/23/2019 PCP: Ardith Dark, MD    Brief Narrative:  Medical records reviewed and are as summarized below:  Savannah Morales is an 54 y.o. female with medical history significant of HTN, IIDM, presented with increasing SOB for 4+ weeks.  Patient has been encouraged overall health status until about 4 weeks ago, started to feel short of breath associated with exertion.  At the beginning she was able to walk 15 to 20 minutes before feeling short of breath but soon she started to have this dry cough turned into productive to clear sputum.  And increasingly she started to feel short of breath more frequently now even with minimal movement she is out of breath easily.  Denies any fever chills no chest pains.  She was seen by her PCP on virtual visit 3 weeks ago was diagnosed with bronchitis, and she was on cough medications. Despite the PRN cough meds, her symptoms not improving. She also developed a bilateral ankle swelling for about one week.  Assessment/Plan:   Active Problems:   Hypertension associated with diabetes (HCC)   Type 2 diabetes mellitus (HCC)   CHF (congestive heart failure) (HCC)   Acute systolic CHF (congestive heart failure) (HCC)   Pulmonary HTN (HCC)   Low TSH level   ? subclinical Hyperthyroidism -TSH was normal 7/19 now 0.341 - normal free t4, normal free t3 -check TSI -already on a BB -she will need follow up in 1 month for lab recheck -patient denied taking biotin but does take some other vitamins-- family to check at home  New onset systolic CHF -echo: 30 to 35%. The  left ventricle has moderately decreased function. The left ventricle  demonstrates global hypokinesis. The left ventricular internal cavity size was moderately dilated. Left ventricular diastolic parameters are consistent with Grade I diastolic dysfunction (impaired relaxation).  -tele with PVC and v tach -euvolemic  today  Pulmonary HTN -CTA showed cardiomegaly and signs of pulmonary HTN. -cardiology consult  HTN -BP borderline low: BB started upon admission -d/c norvasc  DM type 2 -SSI HgbA1c: 6.6   Family Communication/Anticipated D/C date and plan/Code Status   DVT prophylaxis: Lovenox ordered. Code Status: Full Code.  Disposition Plan: Status is: Inpatient  Remains inpatient appropriate because:Inpatient level of care appropriate due to severity of illness   Dispo: The patient is from: Home              Anticipated d/c is to: Home              Anticipated d/c date is: 24 hours              Patient currently is not medically stable to d/c.         Medical Consultants:    Cards  Endocrinology (discussed on phone)    Subjective:   Up walking the hallway, no complaints  Objective:    Vitals:   12/25/19 1807 12/26/19 0039 12/26/19 0614 12/26/19 1111  BP: 92/66 103/72 100/76 90/60  Pulse: 100 79 98 97  Resp: Temp: 98.3 F (36.8 C) 98.3 F (36.8 C) 97.7 F (36.5 C) 98.7 F (37.1 C)  TempSrc: Oral Oral Oral Oral  SpO2: 96% 90% 97% 99%  Weight:      Height:        Intake/Output Summary (Last 24 hours) at 12/26/2019 1357 Last data filed at 12/26/2019 0800 Gross per  24 hour  Intake 600 ml  Output --  Net 600 ml   Filed Weights   12/23/19 1822 12/25/19 0533 12/25/19 0910  Weight: 88.5 kg 85.4 kg 84.7 kg    Exam:   General: Appearance:     Overweight female in no acute distress     Lungs:     Clear to auscultation bilaterally, respirations unlabored  Heart:    Normal heart rate. Normal rhythm. No murmurs, rubs, or gallops.   MS:   All extremities are intact.   Neurologic:   Awake, alert, oriented x 3. No apparent focal neurological           defect.     Data Reviewed:   I have personally reviewed following labs and imaging studies:  Labs: Labs show the following:   Basic Metabolic Panel: Recent Labs  Lab 12/23/19 1841  12/23/19 1841 12/24/19 0734 12/25/19 0807 12/26/19 0403  NA 140  --   --  141 141  K 3.9   < >  --  3.5 3.5  CL 108  --   --  106 107  CO2 22  --   --  22 22  GLUCOSE 102*  --   --  172* 97  BUN 10  --   --  14 15  CREATININE 0.85  --   --  0.88 0.84  CALCIUM 8.8*  --   --  9.4 9.2  MG  --   --  2.1 1.8 1.9   < > = values in this interval not displayed.   GFR Estimated Creatinine Clearance: 89.3 mL/min (by C-G formula based on SCr of 0.84 mg/dL). Liver Function Tests: Recent Labs  Lab 12/23/19 1841  AST 43*  ALT 49*  ALKPHOS 45  BILITOT 0.8  PROT 6.6  ALBUMIN 3.7   No results for input(s): LIPASE, AMYLASE in the last 168 hours. No results for input(s): AMMONIA in the last 168 hours. Coagulation profile No results for input(s): INR, PROTIME in the last 168 hours.  CBC: Recent Labs  Lab 12/23/19 1841 12/26/19 0403  WBC 6.2 5.6  HGB 12.1 12.3  HCT 38.3 37.5  MCV 83.1 80.5  PLT 199 198   Cardiac Enzymes: No results for input(s): CKTOTAL, CKMB, CKMBINDEX, TROPONINI in the last 168 hours. BNP (last 3 results) No results for input(s): PROBNP in the last 8760 hours. CBG: Recent Labs  Lab 12/25/19 1647 12/25/19 1850 12/25/19 2124 12/26/19 0616 12/26/19 1059  GLUCAP 121* 116* 81 109* 154*   D-Dimer: No results for input(s): DDIMER in the last 72 hours. Hgb A1c: Recent Labs    12/24/19 1710  HGBA1C 6.6*   Lipid Profile: Recent Labs    12/25/19 0807  CHOL 160  HDL 50  LDLCALC 98  TRIG 60  CHOLHDL 3.2   Thyroid function studies: Recent Labs    12/25/19 0807 12/25/19 1240  TSH 0.341*  --   T3FREE  --  3.8   Anemia work up: No results for input(s): VITAMINB12, FOLATE, FERRITIN, TIBC, IRON, RETICCTPCT in the last 72 hours. Sepsis Labs: Recent Labs  Lab 12/23/19 1841 12/26/19 0403  WBC 6.2 5.6    Microbiology Recent Results (from the past 240 hour(s))  SARS Coronavirus 2 by RT PCR (hospital order, performed in [facility] hospital lab)  Nasopharyngeal Nasopharyngeal Swab     Status: None   Collection Time: 12/23/19  6:38 PM   Specimen: Nasopharyngeal Swab  Result Value Ref Range Status  SARS Coronavirus 2 NEGATIVE NEGATIVE Final    Comment: (NOTE) SARS-CoV-2 target nucleic acids are NOT DETECTED.  The SARS-CoV-2 RNA is generally detectable in upper and lower respiratory specimens during the acute phase of infection. The lowest concentration of SARS-CoV-2 viral copies this assay can detect is 250 copies / mL. A negative result does not preclude SARS-CoV-2 infection and should not be used as the sole basis for treatment or other patient management decisions.  A negative result may occur with improper specimen collection / handling, submission of specimen other than nasopharyngeal swab, presence of viral mutation(s) within the areas targeted by this assay, and inadequate number of viral copies (<250 copies / mL). A negative result must be combined with clinical observations, patient history, and epidemiological information.  Fact Sheet for Patients:   BoilerBrush.com.cy  Fact Sheet for Healthcare Providers: https://pope.com/  This test is not yet approved or  cleared by the [place] FDA and has been authorized for detection and/or diagnosis of SARS-CoV-2 by FDA under an Emergency Use Authorization (EUA).  This EUA will remain in effect (meaning this test can be used) for the duration of the COVID-19 declaration under Section 564(b)(1) of the Act, 21 U.S.C. section 360bbb-3(b)(1), unless the authorization is terminated or revoked sooner.  Performed at [facility], [address] [place], [place] [address]     Procedures and diagnostic studies:  DG Chest Port 1 View  Result Date: 12/25/2019 CLINICAL DATA:  CHF. EXAM: PORTABLE CHEST 1 VIEW COMPARISON:  12/23/2019 FINDINGS: 0452 hours. The cardio pericardial silhouette is enlarged. No substantial  vascular congestion. No pulmonary edema. No focal consolidation. No effusion. The visualized bony structures of the thorax show now acute abnormality. Telemetry leads overlie the chest. IMPRESSION: Cardiomegaly without acute cardiopulmonary findings. Electronically Signed   By: Kennith Center M.D.   On: 12/25/2019 06:59   ECHOCARDIOGRAM COMPLETE  Result Date: 12/24/2019    ECHOCARDIOGRAM REPORT   Patient Name:   Savannah Morales Date of Exam: 12/24/2019 Medical Rec #:  [id number]       Height:       68.0 in Accession #:    [id number]      Weight:       195.0 lb Date of Birth:  1965-09-10       BSA:          2.022 m Patient Age:    54 years        BP:           111/77 mmHg Patient Gender: F               HR:           106 bpm. Exam Location:  Inpatient Procedure: 2D Echo Indications:    acute systolic chf 428.21  History:        Patient has no prior history of Echocardiogram examinations.                 Signs/Symptoms:ankle swelling; Risk Factors:Hypertension and                 Diabetes.  Sonographer:    Delcie Roch Referring Phys: [id number] PING T ZHANG IMPRESSIONS  1. Left ventricular ejection fraction, by estimation, is 30 to 35%. The left ventricle has moderately decreased function. The left ventricle demonstrates global hypokinesis. The left ventricular internal cavity size was moderately dilated. Left ventricular diastolic parameters are consistent with Grade I diastolic dysfunction (impaired relaxation).  2. Right ventricular systolic  function is normal. The right ventricular size is normal. There is normal pulmonary artery systolic pressure.  3. Left atrial size was severely dilated.  4. The mitral valve is normal in structure. Moderate mitral valve regurgitation. No evidence of mitral stenosis.  5. The aortic valve is normal in structure. Aortic valve regurgitation is not visualized. No aortic stenosis is present.  6. The inferior vena cava is normal in size with greater than 50% respiratory  variability, suggesting right atrial pressure of 3 mmHg. FINDINGS  Left Ventricle: Left ventricular ejection fraction, by estimation, is 30 to 35%. The left ventricle has moderately decreased function. The left ventricle demonstrates global hypokinesis. The left ventricular internal cavity size was moderately dilated. There is no left ventricular hypertrophy. Left ventricular diastolic parameters are consistent with Grade I diastolic dysfunction (impaired relaxation). Right Ventricle: The right ventricular size is normal. No increase in right ventricular wall thickness. Right ventricular systolic function is normal. There is normal pulmonary artery systolic pressure. The tricuspid regurgitant velocity is 2.47 m/s, and  with an assumed right atrial pressure of 3 mmHg, the estimated right ventricular systolic pressure is 27.4 mmHg. Left Atrium: Left atrial size was severely dilated. Right Atrium: Right atrial size was normal in size. Pericardium: There is no evidence of pericardial effusion. Mitral Valve: The mitral valve is normal in structure. There is mild thickening of the mitral valve leaflet(s). Normal mobility of the mitral valve leaflets. Moderate mitral valve regurgitation. No evidence of mitral valve stenosis. Tricuspid Valve: The tricuspid valve is normal in structure. Tricuspid valve regurgitation is trivial. No evidence of tricuspid stenosis. Aortic Valve: The aortic valve is normal in structure. Aortic valve regurgitation is not visualized. No aortic stenosis is present. Pulmonic Valve: The pulmonic valve was normal in structure. Pulmonic valve regurgitation is trivial. No evidence of pulmonic stenosis. Aorta: The aortic root is normal in size and structure. Venous: The inferior vena cava is normal in size with greater than 50% respiratory variability, suggesting right atrial pressure of 3 mmHg. IAS/Shunts: No atrial level shunt detected by color flow Doppler.  LEFT VENTRICLE PLAX 2D LVIDd:         6.80  cm Diastology LVIDs:         5.70 cm LV e' lateral: 7.94 cm/s LV PW:         1.00 cm LV e' medial:  5.98 cm/s LV IVS:        0.70 cm  RIGHT VENTRICLE            IVC RV S prime:     9.79 cm/s  IVC diam: 2.00 cm TAPSE (M-mode): 2.3 cm LEFT ATRIUM              Index       RIGHT ATRIUM           Index LA diam:        4.60 cm  2.28 cm/m  RA Area:     15.90 cm LA Vol (A2C):   81.8 ml  40.46 ml/m RA Volume:   39.70 ml  19.64 ml/m LA Vol (A4C):   101.0 ml 49.95 ml/m LA Biplane Vol: 91.1 ml  45.06 ml/m  AORTIC VALVE LVOT Vmax:   76.40 cm/s LVOT Vmean:  52.900 cm/s LVOT VTI:    0.110 m  AORTA Ao Asc diam: 2.90 cm MR Peak grad:    85.0 mmHg   TRICUSPID VALVE MR Mean grad:    55.0 mmHg   TR Peak grad:  24.4 mmHg MR Vmax:         461.00 cm/s TR Vmax:        247.00 cm/s MR Vmean:        351.0 cm/s MR PISA:         0.25 cm    SHUNTS MR PISA Eff ROA: 2 mm       Systemic VTI: 0.11 m MR PISA Radius:  0.20 cm Donato Schultz MD Electronically signed by Donato Schultz MD Signature Date/Time: 12/24/2019/5:06:44 PM    Final     Medications:   . enoxaparin (LOVENOX) injection  40 mg Subcutaneous Q24H  . insulin aspart  0-15 Units Subcutaneous TID WC  . lisinopril  2.5 mg Oral Daily  . [START ON 12/27/2019] metoprolol succinate  50 mg Oral Daily  . sodium chloride flush  3 mL Intravenous Q12H   Continuous Infusions: . sodium chloride       LOS: 2 days   Joseph Art  [facility]   How to contact the [facility] Attending or Consulting provider 7A - 7P or covering provider during after hours 7P -7A, for this patient?  1. Check the care team in [facility] and look for a) attending/consulting TRH provider listed and b) the [facility] team listed 2. Log into [email or web address] and use [place]'s universal password to access. If you do not have the password, please contact the hospital operator. 3. Locate the [facility] provider you are looking for under [facility] and page to a number that you can be directly reached. 4. If you still  have difficulty reaching the provider, please page the [facility] (Director on Call) for the Hospitalists listed on amion for assistance.  12/26/2019, 1:57 PM

## 2019-12-26 NOTE — Progress Notes (Signed)
Call from tele with report that pt had 6 beat run of vtach. md made aware. Pt asleep, no complaints, will cont to monitor.

## 2019-12-26 NOTE — Consult Note (Signed)
   [facility] CM Inpatient Consult   12/26/2019  Savannah Morales Jul 15, 1965 [id number]   [place] [facility] [ACO] [facility]  [facility] RNCM referral:  Patient evaluated for community based chronic disease management [HF] services with [facility] [facility] as a benefit of patient's [employer]. Spoke with patient at bedside to explain [place] [facility] services.  Patient will receive post hospital discharge call and for assessments fo community needs for care/disease management.   Met with patient at the bedside and explained services. Patient given a brochure and 24 hour nurse advise line magnet and placed on bedside table.  Plan: Follow up with Inpatient Transition of Care [TOC] Case Manager aware that [place] [facility] following.   Of note, [facility] Care Management services does not replace or interfere with any services that are arranged by inpatient Transition of Care [TOC] team     For additional questions or referrals please contact:    Natividad Brood, RN BSN [place] [facility] Liaison  [phone] business mobile phone Toll free office [phone]  Fax number: [phone] [email or web address]  [email or web address]

## 2019-12-26 NOTE — Progress Notes (Signed)
Cardiology Progress Note  Patient ID: Savannah Morales MRN: [id number] DOB: August 19, 1965 Date of Encounter: 12/26/2019  Primary Cardiologist: Reatha Harps, MD  Subjective   Chief Complaint: SOB  HPI: pt resp improved, she has been walking around some in the room, no palpitations, no presyncope or syncope  ROS:  All other ROS reviewed and negative. Pertinent positives noted in the HPI.     Inpatient Medications  Scheduled Meds: . enoxaparin (LOVENOX) injection  40 mg Subcutaneous Q24H  . insulin aspart  0-15 Units Subcutaneous TID WC  . metoprolol succinate  25 mg Oral Daily  . sodium chloride flush  3 mL Intravenous Q12H   Continuous Infusions: . sodium chloride     PRN Meds: sodium chloride, acetaminophen, ondansetron (ZOFRAN) IV, sodium chloride flush   Vital Signs   Vitals:   12/25/19 1807 12/26/19 0039 12/26/19 0614 12/26/19 1111  BP: 92/66 103/72 100/76 90/60  Pulse: 100 79 98 97  Resp: 16 17 17 17   Temp: 98.3 F (36.8 C) 98.3 F (36.8 C) 97.7 F (36.5 C) 98.7 F (37.1 C)  TempSrc: Oral Oral Oral Oral  SpO2: 96% 90% 97% 99%  Weight:      Height:        Intake/Output Summary (Last 24 hours) at 12/26/2019 1224 Last data filed at 12/26/2019 0800 Gross per 24 hour  Intake 600 ml  Output --  Net 600 ml   Last 3 Weights 12/25/2019 12/25/2019 12/23/2019  Weight (lbs) 186 lb 11.7 oz 188 lb 3.2 oz 195 lb  Weight (kg) 84.7 kg 85.367 kg 88.451 kg      Telemetry  Overnight telemetry shows SR, frequent PVCs, bigeminy and trigeminy, 5-6 bt runs NSVT, which I personally reviewed.   ECG  The most recent ECG shows: 8/29 ECG is sinus tach, heart rate 106, PVCs seen, possible LVH , which I personally reviewed.   Physical Exam   Vitals:   12/25/19 1807 12/26/19 0039 12/26/19 0614 12/26/19 1111  BP: 92/66 103/72 100/76 90/60  Pulse: 100 79 98 97  Resp: 16 17 17 17   Temp: 98.3 F (36.8 C) 98.3 F (36.8 C) 97.7 F (36.5 C) 98.7 F (37.1 C)  TempSrc: Oral Oral  Oral Oral  SpO2: 96% 90% 97% 99%  Weight:      Height:         Intake/Output Summary (Last 24 hours) at 12/26/2019 1224 Last data filed at 12/26/2019 0800 Gross per 24 hour  Intake 600 ml  Output --  Net 600 ml    Last 3 Weights 12/25/2019 12/25/2019 12/23/2019  Weight (lbs) 186 lb 11.7 oz 188 lb 3.2 oz 195 lb  Weight (kg) 84.7 kg 85.367 kg 88.451 kg    Body mass index is 28.39 kg/m.  General: Well nourished, well developed, in no acute distress Head: Atraumatic, normal size  Eyes: PEERLA, EOMI  Neck: Supple, no JVD Endocrine: No thryomegaly Cardiac: Normal S1, S2; RRR; no murmurs, rubs, or gallops Lungs: Clear to auscultation bilaterally, no wheezing, rhonchi or rales  Abd: Soft, nontender, no hepatomegaly  Ext: No edema, pulses 2+ Musculoskeletal: No deformities, BUE and BLE strength normal and equal Skin: Warm and dry, no rashes   Neuro: Alert and oriented to person, place, time, and situation, CNII-XII grossly intact, no focal deficits  Psych: Normal mood and affect   Labs  High Sensitivity Troponin:   Recent Labs  Lab 12/24/19 0734 12/24/19 1024  TROPONINIHS 20* 24*       Chemistry  Recent Labs  Lab 12/23/19 1841 12/25/19 0807 12/26/19 0403  NA 140 141 141  K 3.9 3.5 3.5  CL 108 106 107  CO2 22 22 22   GLUCOSE 102* 172* 97  BUN 10 14 15   CREATININE 0.85 0.88 0.84  CALCIUM 8.8* 9.4 9.2  PROT 6.6  --   --   ALBUMIN 3.7  --   --   AST 43*  --   --   ALT 49*  --   --   ALKPHOS 45  --   --   BILITOT 0.8  --   --   GFRNONAA >60 >60 >60  GFRAA >60 >60 >60  ANIONGAP 10 13 12     Hematology Recent Labs  Lab 12/23/19 1841 12/26/19 0403  WBC 6.2 5.6  RBC 4.61 4.66  HGB 12.1 12.3  HCT 38.3 37.5  MCV 83.1 80.5  MCH 26.2 26.4  MCHC 31.6 32.8  RDW 15.2 15.0  PLT 199 198   BNP Recent Labs  Lab 12/24/19 0735  BNP 607.9*    DDimer No results for input(s): DDIMER in the last 168 hours.  Lab Results  Component Value Date   HGBA1C 6.6 (H) 12/24/2019    Lab Results  Component Value Date   TSH 0.341 (L) 12/25/2019    T3, Free  Date Value Ref Range Status  12/25/2019 3.8 2.0 - 4.4 pg/mL Final    Comment:    (NOTE) Performed At: [facility] [address] [place], [facility] [address] [place]     Free T4  Date Value Ref Range Status  12/25/2019 1.12 0.61 - 1.12 ng/dL Final    Comment:    (NOTE) Biotin ingestion may interfere with free T4 tests. If the results are inconsistent with the TSH level, previous test results, or the clinical presentation, then consider biotin interference. If needed, order repeat testing after stopping biotin. Performed at [facility], [address], [place], [facility] [address]       Lab Results  Component Value Date   CHOL 160 12/25/2019   HDL 50 12/25/2019   LDLCALC 98 12/25/2019   TRIG 60 12/25/2019   CHOLHDL 3.2 12/25/2019    Radiology  DG Chest Port 1 View  Result Date: 12/25/2019 CLINICAL DATA:  CHF. EXAM: PORTABLE CHEST 1 VIEW COMPARISON:  12/23/2019 FINDINGS: 0452 hours. The cardio pericardial silhouette is enlarged. No substantial vascular congestion. No pulmonary edema. No focal consolidation. No effusion. The visualized bony structures of the thorax show now acute abnormality. Telemetry leads overlie the chest. IMPRESSION: Cardiomegaly without acute cardiopulmonary findings. Electronically Signed   By: 12/27/2019 M.D.   On: 12/25/2019 06:59   ECHOCARDIOGRAM COMPLETE  Result Date: 12/24/2019    ECHOCARDIOGRAM REPORT   Patient Name:   Savannah Morales Date of Exam: 12/24/2019 Medical Rec #:  12/26/2019       Height:       68.0 in Accession #:    [id number] Nickels      Weight:       195.0 lb Date of Birth:  [birth year]       BSA:          2.022 m Patient Age:    54 years        BP:           111/77 mmHg Patient Gender: F               HR:  106 bpm. Exam Location:  Inpatient Procedure: 2D Echo Indications:    acute systolic chf 428.21  History:         Patient has no prior history of Echocardiogram examinations.                 Signs/Symptoms:ankle swelling; Risk Factors:Hypertension and                 Diabetes.  Sonographer:    Delcie Roch Referring Phys: [id number] PING T ZHANG IMPRESSIONS  1. Left ventricular ejection fraction, by estimation, is 30 to 35%. The left ventricle has moderately decreased function. The left ventricle demonstrates global hypokinesis. The left ventricular internal cavity size was moderately dilated. Left ventricular diastolic parameters are consistent with Grade I diastolic dysfunction (impaired relaxation).  2. Right ventricular systolic function is normal. The right ventricular size is normal. There is normal pulmonary artery systolic pressure.  3. Left atrial size was severely dilated.  4. The mitral valve is normal in structure. Moderate mitral valve regurgitation. No evidence of mitral stenosis.  5. The aortic valve is normal in structure. Aortic valve regurgitation is not visualized. No aortic stenosis is present.  6. The inferior vena cava is normal in size with greater than 50% respiratory variability, suggesting right atrial pressure of 3 mmHg. FINDINGS  Left Ventricle: Left ventricular ejection fraction, by estimation, is 30 to 35%. The left ventricle has moderately decreased function. The left ventricle demonstrates global hypokinesis. The left ventricular internal cavity size was moderately dilated. There is no left ventricular hypertrophy. Left ventricular diastolic parameters are consistent with Grade I diastolic dysfunction (impaired relaxation). Right Ventricle: The right ventricular size is normal. No increase in right ventricular wall thickness. Right ventricular systolic function is normal. There is normal pulmonary artery systolic pressure. The tricuspid regurgitant velocity is 2.47 m/s, and  with an assumed right atrial pressure of 3 mmHg, the estimated right ventricular systolic pressure is 27.4 mmHg. Left  Atrium: Left atrial size was severely dilated. Right Atrium: Right atrial size was normal in size. Pericardium: There is no evidence of pericardial effusion. Mitral Valve: The mitral valve is normal in structure. There is mild thickening of the mitral valve leaflet(s). Normal mobility of the mitral valve leaflets. Moderate mitral valve regurgitation. No evidence of mitral valve stenosis. Tricuspid Valve: The tricuspid valve is normal in structure. Tricuspid valve regurgitation is trivial. No evidence of tricuspid stenosis. Aortic Valve: The aortic valve is normal in structure. Aortic valve regurgitation is not visualized. No aortic stenosis is present. Pulmonic Valve: The pulmonic valve was normal in structure. Pulmonic valve regurgitation is trivial. No evidence of pulmonic stenosis. Aorta: The aortic root is normal in size and structure. Venous: The inferior vena cava is normal in size with greater than 50% respiratory variability, suggesting right atrial pressure of 3 mmHg. IAS/Shunts: No atrial level shunt detected by color flow Doppler.  LEFT VENTRICLE PLAX 2D LVIDd:         6.80 cm Diastology LVIDs:         5.70 cm LV e' lateral: 7.94 cm/s LV PW:         1.00 cm LV e' medial:  5.98 cm/s LV IVS:        0.70 cm  RIGHT VENTRICLE            IVC RV S prime:     9.79 cm/s  IVC diam: 2.00 cm TAPSE (M-mode): 2.3 cm LEFT ATRIUM  Index       RIGHT ATRIUM           Index LA diam:        4.60 cm  2.28 cm/m  RA Area:     15.90 cm LA Vol (A2C):   81.8 ml  40.46 ml/m RA Volume:   39.70 ml  19.64 ml/m LA Vol (A4C):   101.0 ml 49.95 ml/m LA Biplane Vol: 91.1 ml  45.06 ml/m  AORTIC VALVE LVOT Vmax:   76.40 cm/s LVOT Vmean:  52.900 cm/s LVOT VTI:    0.110 m  AORTA Ao Asc diam: 2.90 cm MR Peak grad:    85.0 mmHg   TRICUSPID VALVE MR Mean grad:    55.0 mmHg   TR Peak grad:   24.4 mmHg MR Vmax:         461.00 cm/s TR Vmax:        247.00 cm/s MR Vmean:        351.0 cm/s MR PISA:         0.25 cm    SHUNTS MR PISA  Eff ROA: 2 mm       Systemic VTI: 0.11 m MR PISA Radius:  0.20 cm Donato Schultz MD Electronically signed by Donato Schultz MD Signature Date/Time: 12/24/2019/5:06:44 PM    Final     Cardiac Studies   ECHO: 12/24/2019 1. Left ventricular ejection fraction, by estimation, is 30 to 35%. The  left ventricle has moderately decreased function. The left ventricle  demonstrates global hypokinesis. The left ventricular internal cavity size  was moderately dilated. Left ventricular diastolic parameters are consistent with Grade I diastolic dysfunction (impaired relaxation).  2. Right ventricular systolic function is normal. The right ventricular  size is normal. There is normal pulmonary artery systolic pressure.  3. Left atrial size was severely dilated.  4. The mitral valve is normal in structure. Moderate mitral valve  regurgitation. No evidence of mitral stenosis.  5. The aortic valve is normal in structure. Aortic valve regurgitation is  not visualized. No aortic stenosis is present.  6. The inferior vena cava is normal in size with greater than 50%  respiratory variability, suggesting right atrial pressure of 3 mmHg.   Patient Profile  Savannah Morales is a 54 y.o. female with hx of  DM, HTN, COVID vaccine 08/2019, who was admitted 08/29 with worsening SOB, EF 35% on echo.  Assessment & Plan   1. Acute combined CHF - no coronary calcifications on PE CTA - no WMA on echo, EF 30-35% - almost certainly NICM, most likely etiology is her PVCs - BB added, SBP 90s at times so will not add anything else  2. Abnl TSH - free T4 is borderline elevated, but free T3 is nl - per IM  3. PVCs - possibly LVOT PVCs, vs epicardial outflow tract - BB added, cannot up-titrate   Active Problems:   Hypertension associated with diabetes (HCC)   Type 2 diabetes mellitus (HCC)   CHF (congestive heart failure) (HCC)   Acute systolic CHF (congestive heart failure) (HCC)   Pulmonary HTN (HCC)   Low TSH  level   For questions or updates, please contact [facility] Please consult [email or web address] for contact info under      Time Spent with Patient: I have spent a total of 15 minutes with patient reviewing hospital notes, telemetry, EKGs, labs and examining the patient as well as establishing an assessment and plan that was discussed with the patient.  > 50%  of time was spent in direct patient care.    Signed, Theodore Demark, [facility] [place]  [facility]

## 2019-12-27 ENCOUNTER — Telehealth: Payer: Self-pay

## 2019-12-27 LAB — BASIC METABOLIC PANEL
Anion gap: 14 (ref 5–15)
Anion gap: 11 (ref 5–15)
BUN: 16 mg/dL (ref 6–20)
BUN: 11 mg/dL (ref 6–20)
CO2: 21 mmol/L — ABNORMAL LOW (ref 22–32)
CO2: 23 mmol/L (ref 22–32)
Calcium: 9.1 mg/dL (ref 8.9–10.3)
Calcium: 8.8 mg/dL — ABNORMAL LOW (ref 8.9–10.3)
Chloride: 104 mmol/L (ref 98–111)
Chloride: 105 mmol/L (ref 98–111)
Creatinine, Ser: 0.75 mg/dL (ref 0.44–1.00)
Creatinine, Ser: 0.82 mg/dL (ref 0.44–1.00)
GFR calc Af Amer: >60 mL/min (ref >60)
GFR calc Af Amer: >60 mL/min (ref >60)
GFR calc non Af Amer: >60 mL/min (ref >60)
GFR calc non Af Amer: >60 mL/min (ref >60)
Glucose, Bld: 93 mg/dL (ref 70–99)
Glucose, Bld: 131 mg/dL — ABNORMAL HIGH (ref 70–99)
Potassium: 4.1 mmol/L (ref 3.5–5.1)
Potassium: 4.2 mmol/L (ref 3.5–5.1)
Sodium: 139 mmol/L (ref 135–145)
Sodium: 139 mmol/L (ref 135–145)

## 2019-12-27 LAB — GLUCOSE, CAPILLARY
Glucose-Capillary: 120 mg/dL — ABNORMAL HIGH (ref 70–99)
Glucose-Capillary: 115 mg/dL — ABNORMAL HIGH (ref 70–99)

## 2019-12-27 LAB — MAGNESIUM: Magnesium: 1.9 mg/dL (ref 1.7–2.4)

## 2019-12-27 MED ORDER — METOPROLOL SUCCINATE ER 50 MG PO TB24: 50.0000 mg | ORAL_TABLET | Freq: Every day | ORAL | 0 refills | Status: DC

## 2019-12-27 MED ORDER — FUROSEMIDE 40 MG PO TABS: 40.0000 mg | ORAL_TABLET | Freq: Every day | ORAL | 0 refills | Status: DC | PRN

## 2019-12-27 MED ORDER — POTASSIUM CHLORIDE ER 20 MEQ PO TBCR: 10.0000 meq | EXTENDED_RELEASE_TABLET | Freq: Every day | ORAL | 0 refills | Status: DC | PRN

## 2019-12-27 MED ORDER — LISINOPRIL 2.5 MG PO TABS: 2.5000 mg | ORAL_TABLET | Freq: Every day | ORAL | 0 refills | Status: DC

## 2019-12-27 NOTE — Discharge Summary (Signed)
Physician Discharge Summary  Savannah Morales ZMO:[id number] DOB: 09-26-1965 DOA: 12/23/2019  PCP: Ardith Dark, MD  Admit date: 12/23/2019 Discharge date: 12/27/2019  Admitted From: Home Disposition: Home  Recommendations for Outpatient Follow-up:  1. Follow up with PCP in 1 week 2. Follow-up with cardiology in 2-3 weeks 3. Repeat TSH in 1 month 4. Please follow up on the following pending results: None  Home Health: None Equipment/Devices: None  Discharge Condition: Stable CODE STATUS: Full code Diet recommendation: Heart healthy   Brief/Interim Summary:  Admission HPI written by Emeline General, MD   Chief Complaint: SOB  HPI: Savannah Morales is a 54 y.o. female with medical history significant of HTN, IIDM, presented with increasing SOB for 4+ weeks.  Patient has been encouraged overall health status until about 4 weeks ago, started to feel short of breath associated with exertion.  At the beginning she was able to walk 15 to 20 minutes before feeling short of breath but soon she started to have this dry cough turned into productive to clear sputum.  And increasingly she started to feel short of breath more frequently now even with minimal movement she is out of breath easily.  Denies any fever chills no chest pains.  She was seen by her PCP on virtual visit 3 weeks ago was diagnosed with bronchitis, and she was on cough medications. Despite the PRN cough meds, her symptoms not improving. She also developed a bilateral ankle swelling for about one week.  ED Course: CTA negative for PE. Tele monitoring and EKG showed frequent PVCs. CTA showed cardiomegaly and signs of pulmonary HTN.    Hospital course:  Acute combined systolic and diastolic heart failure Patient managed on Lasix IV. Weight of 195 lbs on admissiona nd down to last recorded weight prior to discharge of 186 lbs. Cardiology consulted for management. Transthoracic Echocardiogram significant for an EF of 30-35%  in addition to grade 1 diastolic dysfunction; global hypokinesis noted. CTA chest significant for no coronary calcifications and more likely etiology is PVC-induced; no heart catheterization recommended in this situation. Recommendations on discharge for metoprolol succinate 50 mg daily, lisinopril 2.5 mg daily and Lasix prn. Outpatient follow-up with cardiology recommended in 2-3 weeks.  Frequent PVCs Non-sustained ventricular tachycardia Cardiology recommending metoprolol succinate.  Subclinical hyperthyroidism TSH of 0.341. Normal free T4/T3. Repeat TSH in one month.  Diabetes mellitus, type 2 Patient is on metformin as an outpatient. Hemoglobin A1C of 6.6%. Continue metformin.  Discharge Diagnoses:  Active Problems:   Hypertension associated with diabetes (HCC)   Type 2 diabetes mellitus (HCC)   CHF (congestive heart failure) (HCC)   Acute systolic CHF (congestive heart failure) (HCC)   Pulmonary HTN (HCC)   Low TSH level    Discharge Instructions  Discharge Instructions    AMB Referral to [facility] Care Management   Complete by: As directed    Hospital referral:  Please assign to [facility] RN Care Coordinator for complex care and disease management follow up calls and assess for further needs.  Questions please call:   Charlesetta Shanks, RN BSN CCM [facility]  [phone] business mobile phone Toll free office [phone]  Fax number: [phone] [email or web address]   Reason for consult: Referral from [facility] for HF follow up   Diagnoses of: Heart Failure   Expected date of contact: 1-3 days (reserved for hospital discharges)   Increase activity slowly   Complete by: As directed      Allergies as of  12/27/2019   No Known Allergies     Medication List    STOP taking these medications   benzonatate 100 MG capsule Commonly known as: TESSALON   promethazine-dextromethorphan 6.25-15 MG/5ML syrup Commonly  known as: PROMETHAZINE-DM     TAKE these medications   acetaminophen 500 MG tablet Commonly known as: TYLENOL Take 1,000 mg by mouth every 6 (six) hours as needed for mild pain.   amLODipine 10 MG tablet Commonly known as: NORVASC Take 1 tablet (10 mg total) by mouth daily.   benzoyl peroxide-erythromycin gel Commonly known as: BENZAMYCIN Apply 1 application topically daily.   ELDERBERRY PO Take 2 tablets by mouth daily.   Fish Oil 1200 MG Cpdr Take 2 capsules by mouth daily.   furosemide 40 MG tablet Commonly known as: Lasix Take 1 tablet (40 mg total) by mouth daily as needed for fluid or edema.   Garlic 1000 MG Caps Take 1 capsule by mouth daily.   lisinopril 2.5 MG tablet Commonly known as: ZESTRIL Take 1 tablet (2.5 mg total) by mouth daily. Start taking on: December 28, 2019   metFORMIN 500 MG 24 hr tablet Commonly known as: GLUCOPHAGE-XR TAKE 1 TABLET BY MOUTH DAILY WITH BREAKFAST   metoprolol succinate 50 MG 24 hr tablet Commonly known as: TOPROL-XL Take 1 tablet (50 mg total) by mouth daily. Take with or immediately following a meal. Start taking on: December 28, 2019       Follow-up Information    Ardith Dark, MD Follow up in 1 week(s).   Specialty: Family Medicine Contact information: [address] [place] [address] [phone]        Sande Rives, MD Follow up.   Specialties: Internal Medicine, Cardiology, Radiology Why: Our office will call you to schedule follow-up visit. Contact information: [address] [place] [address] [phone]              No Known Allergies  Consultations:  Cardiology   Procedures/Studies: DG Chest 2 View  Result Date: 12/23/2019 CLINICAL DATA:  Shortness of breath for 1 month complaining by dry cough, hypertension controlled by medication, diabetes mellitus on metformin EXAM: CHEST - 2 VIEW COMPARISON:  None FINDINGS: Enlargement of cardiac silhouette. Mediastinal  contours and pulmonary vascularity normal. Retrocardiac LEFT lower lobe opacity with loss of LEFT diaphragmatic silhouette likely representing pneumonia. Remaining lungs clear. No definite pleural effusion or pneumothorax. Mild broad-based levoconvex thoracic scoliosis. IMPRESSION: LEFT lower lobe pneumonia. Enlargement of cardiac silhouette. Electronically Signed   By: Ulyses Southward M.D.   On: 12/23/2019 19:22   CT Angio Chest PE W and/or Wo Contrast  Result Date: 12/24/2019 CLINICAL DATA:  Chest pain and shortness of breath. EXAM: CT ANGIOGRAPHY CHEST WITH CONTRAST TECHNIQUE: Multidetector CT imaging of the chest was performed using the standard protocol during bolus administration of intravenous contrast. Multiplanar CT image reconstructions and MIPs were obtained to evaluate the vascular anatomy. CONTRAST:  49mL OMNIPAQUE IOHEXOL 350 MG/ML SOLN COMPARISON:  Chest x-ray December 23, 2019 FINDINGS: Cardiovascular: Cardiomegaly. No coronary artery calcifications are identified. There is poor opacification of the thoracic aorta limiting evaluation but no aneurysm is seen. No atherosclerotic change. The lack of opacification of the thoracic aorta precludes evaluation for a dissection. The main pulmonary artery measures 3.7 cm. No left-sided pulmonary emboli. Evaluation of the right upper lobe pulmonary arterial branches is significantly limited due to streak artifact secondary to dense contrast opacifications the SVC. Apparent filling defects in central upper lobe branches are thought  to be due to the streak artifact. Evaluation of the central right lower lobe branches is also limited due to streak artifact. For example, an apparent filling defects seen on series 7, image 194 correlates with streak artifact. Mediastinum/Nodes: The chest wall is normal. Tiny pleural effusions are seen. The esophagus is normal. No adenopathy. The thyroid is normal. Lungs/Pleura: The central airways are normal. Bronchial wall thickening  is seen in the bases bilaterally. Mild interlobular septal thickening is seen in the apices. Mild scattered ground-glass is seen. There is an apparent nodule posteriorly on the right on series 6, image 81 measuring 5 mm. No suspicious masses. Upper Abdomen: No acute abnormality. Musculoskeletal: No chest wall abnormality. No acute or significant osseous findings. Review of the MIP images confirms the above findings. IMPRESSION: 1. Evaluation for pulmonary emboli in the right upper lobe and the central right lower lobe is limited due to streak artifact secondary to dense contrast in the SVC. Apparent filling defects in these regions are thought to be due to this artifact. Within this limitation, no convincing evidence of pulmonary emboli. 2. Cardiomegaly, mild edema, and tiny pleural effusions. 3. Bronchial wall thickening in the bases suggesting bronchitis. 4. The main pulmonary artery measures 3.7 cm raising the possibility of pulmonary arterial hypertension. 5. 5 mm nodule in the right lung. No follow-up needed if patient is low-risk. Non-contrast chest CT can be considered in 12 months if patient is high-risk. This recommendation follows the consensus statement: Guidelines for Management of Incidental Pulmonary Nodules Detected on CT Images: From the Fleischner Society 2017; Radiology 2017; 284:228-243. Electronically Signed   By: Gerome Sam III M.D   On: 12/24/2019 09:47   DG Chest Port 1 View  Result Date: 12/25/2019 CLINICAL DATA:  CHF. EXAM: PORTABLE CHEST 1 VIEW COMPARISON:  12/23/2019 FINDINGS: 0452 hours. The cardio pericardial silhouette is enlarged. No substantial vascular congestion. No pulmonary edema. No focal consolidation. No effusion. The visualized bony structures of the thorax show now acute abnormality. Telemetry leads overlie the chest. IMPRESSION: Cardiomegaly without acute cardiopulmonary findings. Electronically Signed   By: Kennith Center M.D.   On: 12/25/2019 06:59    ECHOCARDIOGRAM COMPLETE  Result Date: 12/24/2019    ECHOCARDIOGRAM REPORT   Patient Name:   Savannah Morales Date of Exam: 12/24/2019 Medical Rec #:  [id number]       Height:       68.0 in Accession #:    [id number]      Weight:       195.0 lb Date of Birth:  [birth year]       BSA:          2.022 m Patient Age:    54 years        BP:           111/77 mmHg Patient Gender: F               HR:           106 bpm. Exam Location:  Inpatient Procedure: 2D Echo Indications:    acute systolic chf 428.21  History:        Patient has no prior history of Echocardiogram examinations.                 Signs/Symptoms:ankle swelling; Risk Factors:Hypertension and                 Diabetes.  Sonographer:    Delcie Roch Referring Phys: [id number] PING T ZHANG IMPRESSIONS  1. Left  ventricular ejection fraction, by estimation, is 30 to 35%. The left ventricle has moderately decreased function. The left ventricle demonstrates global hypokinesis. The left ventricular internal cavity size was moderately dilated. Left ventricular diastolic parameters are consistent with Grade I diastolic dysfunction (impaired relaxation).  2. Right ventricular systolic function is normal. The right ventricular size is normal. There is normal pulmonary artery systolic pressure.  3. Left atrial size was severely dilated.  4. The mitral valve is normal in structure. Moderate mitral valve regurgitation. No evidence of mitral stenosis.  5. The aortic valve is normal in structure. Aortic valve regurgitation is not visualized. No aortic stenosis is present.  6. The inferior vena cava is normal in size with greater than 50% respiratory variability, suggesting right atrial pressure of 3 mmHg. FINDINGS  Left Ventricle: Left ventricular ejection fraction, by estimation, is 30 to 35%. The left ventricle has moderately decreased function. The left ventricle demonstrates global hypokinesis. The left ventricular internal cavity size was moderately dilated. There is no  left ventricular hypertrophy. Left ventricular diastolic parameters are consistent with Grade I diastolic dysfunction (impaired relaxation). Right Ventricle: The right ventricular size is normal. No increase in right ventricular wall thickness. Right ventricular systolic function is normal. There is normal pulmonary artery systolic pressure. The tricuspid regurgitant velocity is 2.47 m/s, and  with an assumed right atrial pressure of 3 mmHg, the estimated right ventricular systolic pressure is 27.4 mmHg. Left Atrium: Left atrial size was severely dilated. Right Atrium: Right atrial size was normal in size. Pericardium: There is no evidence of pericardial effusion. Mitral Valve: The mitral valve is normal in structure. There is mild thickening of the mitral valve leaflet(s). Normal mobility of the mitral valve leaflets. Moderate mitral valve regurgitation. No evidence of mitral valve stenosis. Tricuspid Valve: The tricuspid valve is normal in structure. Tricuspid valve regurgitation is trivial. No evidence of tricuspid stenosis. Aortic Valve: The aortic valve is normal in structure. Aortic valve regurgitation is not visualized. No aortic stenosis is present. Pulmonic Valve: The pulmonic valve was normal in structure. Pulmonic valve regurgitation is trivial. No evidence of pulmonic stenosis. Aorta: The aortic root is normal in size and structure. Venous: The inferior vena cava is normal in size with greater than 50% respiratory variability, suggesting right atrial pressure of 3 mmHg. IAS/Shunts: No atrial level shunt detected by color flow Doppler.  LEFT VENTRICLE PLAX 2D LVIDd:         6.80 cm Diastology LVIDs:         5.70 cm LV e' lateral: 7.94 cm/s LV PW:         1.00 cm LV e' medial:  5.98 cm/s LV IVS:        0.70 cm  RIGHT VENTRICLE            IVC RV S prime:     9.79 cm/s  IVC diam: 2.00 cm TAPSE (M-mode): 2.3 cm LEFT ATRIUM              Index       RIGHT ATRIUM           Index LA diam:        4.60 cm  2.28  cm/m  RA Area:     15.90 cm LA Vol (A2C):   81.8 ml  40.46 ml/m RA Volume:   39.70 ml  19.64 ml/m LA Vol (A4C):   101.0 ml 49.95 ml/m LA Biplane Vol: 91.1 ml  45.06 ml/m  AORTIC VALVE LVOT Vmax:  76.40 cm/s LVOT Vmean:  52.900 cm/s LVOT VTI:    0.110 m  AORTA Ao Asc diam: 2.90 cm MR Peak grad:    85.0 mmHg   TRICUSPID VALVE MR Mean grad:    55.0 mmHg   TR Peak grad:   24.4 mmHg MR Vmax:         461.00 cm/s TR Vmax:        247.00 cm/s MR Vmean:        351.0 cm/s MR PISA:         0.25 cm    SHUNTS MR PISA Eff ROA: 2 mm       Systemic VTI: 0.11 m MR PISA Radius:  0.20 cm Donato Schultz MD Electronically signed by Donato Schultz MD Signature Date/Time: 12/24/2019/5:06:44 PM    Final       Subjective: No chest pain or dyspnea.  Discharge Exam: Vitals:   12/27/19 0634 12/27/19 1159  BP: 99/78 (!) 105/57  Pulse: 100 (!) 56  Resp:  16  Temp: 97.7 F (36.5 C) 97.7 F (36.5 C)  SpO2:  97%   Vitals:   12/26/19 1111 12/26/19 2319 12/27/19 0634 12/27/19 1159  BP: 90/60 90/74 99/78  (!) 105/57  Pulse: 97 98 100 (!) 56  Resp: 17 15  16   Temp: 98.7 F (37.1 C) 98 F (36.7 C) 97.7 F (36.5 C) 97.7 F (36.5 C)  TempSrc: Oral Oral Oral Oral  SpO2: 99% 97%  97%  Weight:      Height:        General: Pt is alert, awake, not in acute distress Cardiovascular: RRR, S1/S2 +, no rubs, no gallops Respiratory: CTA bilaterally, no wheezing, no rhonchi Abdominal: Soft, NT, ND, bowel sounds + Extremities: no edema, no cyanosis    The results of significant diagnostics from this hospitalization (including imaging, microbiology, ancillary and laboratory) are listed below for reference.     Microbiology: Recent Results (from the past 240 hour(s))  SARS Coronavirus 2 by RT PCR (hospital order, performed in [facility] hospital lab) Nasopharyngeal Nasopharyngeal Swab     Status: None   Collection Time: 12/23/19  6:38 PM   Specimen: Nasopharyngeal Swab  Result Value Ref Range Status   SARS Coronavirus  2 NEGATIVE NEGATIVE Final    Comment: (NOTE) SARS-CoV-2 target nucleic acids are NOT DETECTED.  The SARS-CoV-2 RNA is generally detectable in upper and lower respiratory specimens during the acute phase of infection. The lowest concentration of SARS-CoV-2 viral copies this assay can detect is 250 copies / mL. A negative result does not preclude SARS-CoV-2 infection and should not be used as the sole basis for treatment or other patient management decisions.  A negative result may occur with improper specimen collection / handling, submission of specimen other than nasopharyngeal swab, presence of viral mutation(s) within the areas targeted by this assay, and inadequate number of viral copies (<250 copies / mL). A negative result must be combined with clinical observations, patient history, and epidemiological information.  Fact Sheet for Patients:   [facility]  Fact Sheet for Healthcare Providers: 12/25/19  This test is not yet approved or  cleared by the [employer] FDA and has been authorized for detection and/or diagnosis of SARS-CoV-2 by FDA under an Emergency Use Authorization (EUA).  This EUA will remain in effect (meaning this test can be used) for the duration of the COVID-19 declaration under Section 564(b)(1) of the Act, 21 U.S.C. section 360bbb-3(b)(1), unless the authorization is terminated or revoked sooner.  Performed at [facility], [address] [place], [place] [address]      Labs: BNP (last 3 results) Recent Labs    12/24/19 0735  BNP 607.9*   Basic Metabolic Panel: Recent Labs  Lab 12/23/19 1841 12/24/19 0734 12/25/19 0807 12/26/19 0403 12/27/19 0140 12/27/19 0647  NA 140  --  141 141 139 139  K 3.9  --  3.5 3.5 4.1 4.2  CL 108  --  106 107 104 105  CO2 22  --  22 22 21* 23  GLUCOSE 102*  --  172* 97 93 131*  BUN 10  --  14 15 16 11   CREATININE 0.85  --  0.88 0.84  0.75 0.82  CALCIUM 8.8*  --  9.4 9.2 9.1 8.8*  MG  --  2.1 1.8 1.9  --  1.9   Liver Function Tests: Recent Labs  Lab 12/23/19 1841  AST 43*  ALT 49*  ALKPHOS 45  BILITOT 0.8  PROT 6.6  ALBUMIN 3.7   No results for input(s): LIPASE, AMYLASE in the last 168 hours. No results for input(s): AMMONIA in the last 168 hours. CBC: Recent Labs  Lab 12/23/19 1841 12/26/19 0403  WBC 6.2 5.6  HGB 12.1 12.3  HCT 38.3 37.5  MCV 83.1 80.5  PLT 199 198   Cardiac Enzymes: No results for input(s): CKTOTAL, CKMB, CKMBINDEX, TROPONINI in the last 168 hours. BNP: Invalid input(s): POCBNP CBG: Recent Labs  Lab 12/26/19 1059 12/26/19 1607 12/26/19 2124 12/27/19 0635 12/27/19 1105  GLUCAP 154* 97 146* 120* 115*   D-Dimer No results for input(s): DDIMER in the last 72 hours. Hgb A1c Recent Labs    12/24/19 1710  HGBA1C 6.6*   Lipid Profile Recent Labs    12/25/19 0807  CHOL 160  HDL 50  LDLCALC 98  TRIG 60  CHOLHDL 3.2   Thyroid function studies Recent Labs    12/25/19 0807 12/25/19 1240  TSH 0.341*  --   T3FREE  --  3.8   Anemia work up No results for input(s): VITAMINB12, FOLATE, FERRITIN, TIBC, IRON, RETICCTPCT in the last 72 hours. Urinalysis    Component Value Date/Time   BILIRUBINUR 1+ 03/17/2016 1331   PROTEINUR 1+ 03/17/2016 1331   UROBILINOGEN negative 03/17/2016 1331   NITRITE negative 03/17/2016 1331   LEUKOCYTESUR moderate (2+) (A) 03/17/2016 1331   Sepsis Labs Invalid input(s): PROCALCITONIN,  WBC,  LACTICIDVEN Microbiology Recent Results (from the past 240 hour(s))  SARS Coronavirus 2 by RT PCR (hospital order, performed in [facility] hospital lab) Nasopharyngeal Nasopharyngeal Swab     Status: None   Collection Time: 12/23/19  6:38 PM   Specimen: Nasopharyngeal Swab  Result Value Ref Range Status   SARS Coronavirus 2 NEGATIVE NEGATIVE Final    Comment: (NOTE) SARS-CoV-2 target nucleic acids are NOT DETECTED.  The SARS-CoV-2 RNA is  generally detectable in upper and lower respiratory specimens during the acute phase of infection. The lowest concentration of SARS-CoV-2 viral copies this assay can detect is 250 copies / mL. A negative result does not preclude SARS-CoV-2 infection and should not be used as the sole basis for treatment or other patient management decisions.  A negative result may occur with improper specimen collection / handling, submission of specimen other than nasopharyngeal swab, presence of viral mutation(s) within the areas targeted by this assay, and inadequate number of viral copies (<250 copies / mL). A negative result must be combined with clinical observations, patient history,  and epidemiological information.  Fact Sheet for Patients:   [employer]  Fact Sheet for Healthcare Providers: https://pope.com/  This test is not yet approved or  cleared by the [place] FDA and has been authorized for detection and/or diagnosis of SARS-CoV-2 by FDA under an Emergency Use Authorization (EUA).  This EUA will remain in effect (meaning this test can be used) for the duration of the COVID-19 declaration under Section 564(b)(1) of the Act, 21 U.S.C. section 360bbb-3(b)(1), unless the authorization is terminated or revoked sooner.  Performed at [facility], [address], [place], [place] [address]      Time coordinating discharge: 35 minutes  SIGNED:   Jacquelin Hawking, MD [facility] 12/27/2019, 12:57 PM

## 2019-12-27 NOTE — Progress Notes (Signed)
Pt gave pt discharge insrtuctions and she stated understanding. IV has been removed and medications escribed to pt home pharmacy. Daughter at bedside for discharge

## 2019-12-27 NOTE — Discharge Instructions (Signed)
Cardiology Recommendations: - Start Metoprolol succinate (Toprol-XL) 50mg  daily. - Start Lisinopril 2.5mg  daily. - Start Lasix 40mg  daily as needed for worsening shortness of breath/lower extremity edema or weight gain (3lb weight gain in 1 day or 5lb weight gain in 1 week). On days when you take Lasix, also take potassium chloride 20 mEq (you do not need to take potassium supplement on days when you do not take Lasix).  Our office will call you to scheduled a follow-up visit within 2-3 weeks.

## 2019-12-27 NOTE — Telephone Encounter (Signed)
-----   Message from Corrin Parker, [place] sent at 12/27/2019  8:48 AM EDT ----- Regarding: Hospital Follow-Up Roxana Hires,  I saw this patient in the hospital today with Dr. Flora Lipps. She needs a follow-up visit with him in 2-3 weeks. He said that you could overbook him if needed. Do you mind calling patient to help schedule this?  Thank you so much! Callie

## 2019-12-27 NOTE — Telephone Encounter (Signed)
Called patient, put on schedule for Dr.O'Neal next week. Patient verbalized understanding.  Thankful for call back.

## 2019-12-27 NOTE — Progress Notes (Addendum)
Progress Note  Patient Name: Savannah Morales Date of Encounter: 12/27/2019  [facility] Cardiologist: Reatha Harps, MD   Subjective   No acute overnight events. Shortness of breath has resolved. No chest pain. Completely unaware of PVCs - no palpitations, lightheadedness, or dizziness.   Inpatient Medications    Scheduled Meds: . enoxaparin (LOVENOX) injection  40 mg Subcutaneous Q24H  . insulin aspart  0-15 Units Subcutaneous TID WC  . lisinopril  2.5 mg Oral Daily  . metoprolol succinate  50 mg Oral Daily  . sodium chloride flush  3 mL Intravenous Q12H   Continuous Infusions: . sodium chloride     PRN Meds: sodium chloride, acetaminophen, ondansetron (ZOFRAN) IV, sodium chloride flush   Vital Signs    Vitals:   12/26/19 0614 12/26/19 1111 12/26/19 2319 12/27/19 0634  BP: 100/76 90/60 90/74  99/78  Pulse: 98 97 98 100  Resp: 17 17 15    Temp: 97.7 F (36.5 C) 98.7 F (37.1 C) 98 F (36.7 C) 97.7 F (36.5 C)  TempSrc: Oral Oral Oral Oral  SpO2: 97% 99% 97%   Weight:      Height:        Intake/Output Summary (Last 24 hours) at 12/27/2019 0738 Last data filed at 12/27/2019 0306 Gross per 24 hour  Intake 840 ml  Output 100 ml  Net 740 ml   Last 3 Weights 12/25/2019 12/25/2019 12/23/2019  Weight (lbs) 186 lb 11.7 oz 188 lb 3.2 oz 195 lb  Weight (kg) 84.7 kg 85.367 kg 88.451 kg      Telemetry    Sinus rhythm with rates in the 80's to 110's. Still have PVCs (sometimes in bigeminy pattern) but improved with high dose of Metoprolol. Short runs of NSVT noted, longest run being 9 beats. - Personally Reviewed  ECG    No new ECG tracing today. - Personally Reviewed  Physical Exam   GEN: No acute distress.   Neck: No JVD Cardiac: RRR with some ectopy. No murmurs, rubs, or gallops. Radial pulses 2+ and equal bilaterally. Respiratory: Clear to auscultation bilaterally. GI: Soft, non-tender, non-distended  MS: No lower extremity edema. No deformity. Skin: Warm  and dry. Neuro:  No focal deficits. Psych: Normal affect.  Labs    High Sensitivity Troponin:   Recent Labs  Lab 12/24/19 0734 12/24/19 1024  TROPONINIHS 20* 24*      Chemistry Recent Labs  Lab 12/23/19 1841 12/23/19 1841 12/25/19 0807 12/26/19 0403 12/27/19 0140  NA 140   < > 141 141 139  K 3.9   < > 3.5 3.5 4.1  CL 108   < > 106 107 104  CO2 22   < > 22 22 21*  GLUCOSE 102*   < > 172* 97 93  BUN 10   < > 14 15 16   CREATININE 0.85   < > 0.88 0.84 0.75  CALCIUM 8.8*   < > 9.4 9.2 9.1  PROT 6.6  --   --   --   --   ALBUMIN 3.7  --   --   --   --   AST 43*  --   --   --   --   ALT 49*  --   --   --   --   ALKPHOS 45  --   --   --   --   BILITOT 0.8  --   --   --   --   GFRNONAA >60   < > >  60 >60 >60  GFRAA >60   < > >60 >60 >60  ANIONGAP 10   < > 13 12 14    < > = values in this interval not displayed.     Hematology Recent Labs  Lab 12/23/19 1841 12/26/19 0403  WBC 6.2 5.6  RBC 4.61 4.66  HGB 12.1 12.3  HCT 38.3 37.5  MCV 83.1 80.5  MCH 26.2 26.4  MCHC 31.6 32.8  RDW 15.2 15.0  PLT 199 198    BNP Recent Labs  Lab 12/24/19 0735  BNP 607.9*     DDimer No results for input(s): DDIMER in the last 168 hours.   Radiology    No results found.  Cardiac Studies   Echocardiogram 12/24/2019: Impressions: 1. Left ventricular ejection fraction, by estimation, is 30 to 35%. The  left ventricle has moderately decreased function. The left ventricle  demonstrates global hypokinesis. The left ventricular internal cavity size  was moderately dilated. Left  ventricular diastolic parameters are consistent with Grade I diastolic  dysfunction (impaired relaxation).  2. Right ventricular systolic function is normal. The right ventricular  size is normal. There is normal pulmonary artery systolic pressure.  3. Left atrial size was severely dilated.  4. The mitral valve is normal in structure. Moderate mitral valve  regurgitation. No evidence of mitral  stenosis.  5. The aortic valve is normal in structure. Aortic valve regurgitation is  not visualized. No aortic stenosis is present.  6. The inferior vena cava is normal in size with greater than 50%  respiratory variability, suggesting right atrial pressure of 3 mmHg.   Patient Profile     54 y.o. female with a history of hypertension and type 2 diabetes mellitus who was admitted on 12/24/2019 with shortness of breath and cough and found to have new onset dilated cardiomyopathy.  Assessment & Plan    New Onset Dilated Cardiomyopathy with Acute Systolic CHF - Felt to likely be due to frequent PVCs. No coronary calcifications seen on chest CTA. Low suspicion for ischemia as cause. - BNP elevated at 607.  - Initial chest x-ray on 8/28 showed left lower lobe opacity concerning for pneumonia. However, this was not seen on repeat imaging on 8/30. Not started on antibiotics. - Echo showed LVEF of 30-35% with global hypokinesis and grade 1 diastolic dysfunction. - Received 1 dose of IV Lasix on 8/30. Net positive 1.8 L this admission.  - Euvolemic on exam.  - Continue Toprol-XL 50mg  daily and Lisinopril 2.5mg  daily. - Will discharge patient on Lasix 40mg  daily PRN for 3 lb weight gain in 1 day, 5 lb weight gain in 1 weeks, or worsening shortness of breath/edema. Recommend PRN Potassium chloride 20 mEq only when she needs to take the Lasix.  Frequent PVC / Non-Sustained VT - Seems to have improved some with increased dose of Toprol-XL. - Potassium 4.2.  - Magnesium 1.9. - Continue Toprol-XL as above.  Subclinical Hyperthyroidism - TSH normal on 7/19 and now 0.341.  - Free T4 and free T3 normal. - TSI pending. - Continue beta-blocker as above. - Will need repeat labs in 1 month.  Hypertension - BP soft but stable. - Continue Toprol-XL and Lisinopril.  Type 2 Diabetes Mellitus - Hemoglobin A1c 6.6.  - On sliding scale insulin. - Management per primary team.   [facility] will  sign off.   Medication Recommendations:  Toprol-XL 50mg  daily, Lisinopril 2.5mg  daily, PRN Lasix 40mg  daily for worsening shortness of breath, worsening edema, or weight  gain (3lbs in 1 day or 5 lbs in 1 week). Also PRN Potassium chloride 20 mEq only when patient needs to take the Lasix. Other recommendations (labs, testing, etc):  N/A Follow up as an outpatient:  Our office will call her to arrange follow-up.    For questions or updates, please contact [facility] Please consult [email or web address] for contact info under        Signed, Corrin Parker, PA-C  12/27/2019, 7:38 AM

## 2019-12-27 NOTE — Progress Notes (Signed)
Call from tele with report that pt had 9 beat run of vtach. Pt asleep, Md made aware. Will cont to monitor.

## 2019-12-28 ENCOUNTER — Telehealth: Payer: Self-pay

## 2019-12-28 LAB — THYROID STIMULATING IMMUNOGLOBULIN: Thyroid Stimulating Immunoglob: <0.1 IU/L (ref 0.00–0.55)

## 2019-12-28 NOTE — Telephone Encounter (Cosign Needed)
Transition Care Management Follow-up Telephone Call  Date of discharge and from where: 12/27/19 [facility]  How have you been since you were released from the hospital? [facility]   Any questions or concerns? No  Items Reviewed:  Did the pt receive and understand the discharge instructions provided? Yes   Medications obtained and verified? Yes   Any new allergies since your discharge? No   Dietary orders reviewed? Yes  Do you have support at home? Yes   Functional Questionnaire: (I = Independent and D = Dependent) ADLs: I  Bathing/Dressing- I  Meal Prep- I  Eating- I  Maintaining continence- I  Transferring/Ambulation- I  Managing Meds- I  Follow up appointments reviewed:   PCP Hospital f/u appt confirmed? No  Pt went back to work today and will follow up with Dr [name]  [facility] f/u appt confirmed? Yes  Scheduled to see Dr Ronnald Ramp on 01/04/20 @ 3:20 pm.  Are transportation arrangements needed? No   If their condition worsens, is the pt aware to call PCP or go to the Emergency Dept.? Yes  Was the patient provided with contact information for the PCP's office or ED? Yes  Was to pt encouraged to call back with questions or concerns? Yes

## 2019-12-29 ENCOUNTER — Other Ambulatory Visit: Payer: Self-pay | Admitting: *Deleted

## 2019-12-29 NOTE — Patient Outreach (Signed)
[facility]) Care Management  12/29/2019  Savannah Morales 06/19/1965 [id number]   Telephone Assessment-Unsuccessfu  Initial outreach however unsuccessful. RN able to leave a [employer] approved voice message requesting a call back.  Will complete another outreach call next week for pending services with [facility].  Elliot Cousin, RN Care Management Coordinator [facility] Main Office [phone]

## 2020-01-03 ENCOUNTER — Encounter: Payer: Self-pay | Admitting: Family Medicine

## 2020-01-03 ENCOUNTER — Telehealth: Payer: Self-pay | Admitting: Family Medicine

## 2020-01-03 ENCOUNTER — Ambulatory Visit (INDEPENDENT_AMBULATORY_CARE_PROVIDER_SITE_OTHER): Payer: 59 | Admitting: Family Medicine

## 2020-01-03 ENCOUNTER — Other Ambulatory Visit: Payer: Self-pay

## 2020-01-03 VITALS — BP 86/61 | HR 52 | Temp 97.5°F | Ht 68.0 in | Wt 190.2 lb

## 2020-01-03 DIAGNOSIS — R7989 Other specified abnormal findings of blood chemistry: Secondary | ICD-10-CM

## 2020-01-03 DIAGNOSIS — I509 Heart failure, unspecified: Secondary | ICD-10-CM | POA: Diagnosis not present

## 2020-01-03 DIAGNOSIS — E119 Type 2 diabetes mellitus without complications: Secondary | ICD-10-CM | POA: Diagnosis not present

## 2020-01-03 NOTE — Patient Instructions (Signed)
It was very nice to see you today!  I am glad that you are doing better.  No other changes today.  I will see you back in 3 to 6 months to recheck your blood sugar.  Take care, Dr [name]  Please try these tips to maintain a healthy lifestyle:   Eat at least 3 REAL meals and 1-2 snacks per day.  Aim for no more than 5 hours between eating.  If you eat breakfast, please do so within one hour of getting up.    Each meal should contain half fruits/vegetables, one quarter protein, and one quarter carbs (no bigger than a computer mouse)   Cut down on sweet beverages. This includes juice, soda, and sweet tea.     Drink at least 1 glass of water with each meal and aim for at least 8 glasses per day   Exercise at least 150 minutes every week.

## 2020-01-03 NOTE — Telephone Encounter (Signed)
Her CT scan didn't show pneumonia. Her cough is probably due to her heart failure and should be getting better as her heart improves  Patient told me that her breathing and coughing were improving over the past week - would like for her to let us know if this is not the case.

## 2020-01-03 NOTE — Assessment & Plan Note (Signed)
Check A1c in 3 to 6 months.

## 2020-01-03 NOTE — Telephone Encounter (Signed)
Unable to contact Patient Daughter, voice mail is full

## 2020-01-03 NOTE — Assessment & Plan Note (Signed)
No signs of volume overload today.  Will be seeing cardiology tomorrow.  We will continue current medication regimen with metoprolol succinate 50 mg daily and lisinopril 2.5 mg daily.  Blood pressure low today but she is not currently having any symptoms.  Would likely stop amlodipine if we need to increase dose of either of her other antihypertensives or if she develops symptoms.

## 2020-01-03 NOTE — Progress Notes (Signed)
Cardiology Office Note:   Date:  01/04/2020  NAME:  Savannah Morales    MRN: [id number] DOB:  11/30/1965   PCP:  Ardith Dark, MD  Cardiologist:  Reatha Harps, MD  Electrophysiologist:  None   Referring MD: Ardith Dark, MD   Chief Complaint  Patient presents with  . Congestive Heart Failure   History of Present Illness:   Savannah Morales is a 54 y.o. female with a hx of CHF, PVCs, DM who is being seen today for the evaluation of CHF at the request of Ardith Dark, MD. Recently seen in the hospital for new onset CHF.  She reports she still getting intermittently short of breath with heavy exertion.  Just reports a nighttime cough.  No significant lower extremity edema.  Blood pressure is 116/76.  We did stop her amlodipine and increase her metoprolol lisinopril.  She also has elevated JVD and likely needs a little bit of Lasix.  EKG still shows ventricular bigeminy.  I ultimately feel she needs to be evaluated for cardiac ablation.  She denies chest pain or palpitations today.  She still seems to be unaware of her PVCs.  Thyroid studies in the hospital demonstrated subclinical hyperthyroidism.  Treatment was not recommended per endocrinology.  She is awaiting see them.  Plans to repeat her thyroid studies will be done by her PCP.  Problem List 1. Systolic HF, 30-35% -likely PVC induced  2. PVCs -possible LVOT 3. DM -A1c 6.6 -T chol 160, HDL 50, LDL 98, TG 60  Past Medical History: Past Medical History:  Diagnosis Date  . Diabetes mellitus without complication (HCC)    Type II  . History of chicken pox   . Hypertension   . Kidney stones     Past Surgical History: Past Surgical History:  Procedure Laterality Date  . ANKLE SURGERY      Current Medications: Current Meds  Medication Sig  . amLODipine (NORVASC) 10 MG tablet Take 1 tablet (10 mg total) by mouth daily.  . benzoyl peroxide-erythromycin (BENZAMYCIN) gel Apply 1 application topically daily.   [name]  ELDERBERRY PO Take 2 tablets by mouth daily.   . furosemide (LASIX) 40 MG tablet Take 1 tablet (40 mg total) by mouth daily as needed for fluid or edema.  . Garlic 1000 MG CAPS Take 1 capsule by mouth daily.   [name] lisinopril (ZESTRIL) 2.5 MG tablet Take 1 tablet (2.5 mg total) by mouth daily.  . metFORMIN (GLUCOPHAGE-XR) 500 MG 24 hr tablet TAKE 1 TABLET BY MOUTH DAILY WITH BREAKFAST (Patient taking differently: Take 500 mg by mouth daily with breakfast. )  . metoprolol succinate (TOPROL-XL) 50 MG 24 hr tablet Take 1 tablet (50 mg total) by mouth daily. Take with or immediately following a meal.  . Omega-3 Fatty Acids (FISH OIL) 1200 MG CPDR Take 2 capsules by mouth daily.   . potassium chloride 20 MEQ TBCR Take 10 mEq by mouth daily as needed (Take when you take a dose of Lasix).     Allergies:    Patient has no known allergies.   Social History: Social History   Socioeconomic History  . Marital status: Divorced    Spouse name: Not on file  . Number of children: 3  . Years of education: 39  . Highest education level: Not on file  Occupational History  . Occupation: Actor   Tobacco Use  . Smoking status: Never Smoker  . Smokeless tobacco: Never Used  Vaping Use  . Vaping Use: Never used  Substance and Sexual Activity  . Alcohol use: Not Currently    Comment: Rarely  . Drug use: No  . Sexual activity: Yes  Other Topics Concern  . Not on file  Social History Narrative  . Not on file   Social Determinants of Health   Financial Resource Strain:   . Difficulty of Paying Living Expenses: Not on file  Food Insecurity:   . Worried About Programme researcher, broadcasting/film/video in the Last Year: Not on file  . Ran Out of Food in the Last Year: Not on file  Transportation Needs:   . Lack of Transportation (Medical): Not on file  . Lack of Transportation (Non-Medical): Not on file  Physical Activity:   . Days of Exercise per Week: Not on file  . Minutes of Exercise per  Session: Not on file  Stress:   . Feeling of Stress : Not on file  Social Connections:   . Frequency of Communication with Friends and Family: Not on file  . Frequency of Social Gatherings with Friends and Family: Not on file  . Attends Religious Services: Not on file  . Active Member of Clubs or Organizations: Not on file  . Attends Banker Meetings: Not on file  . Marital Status: Not on file     Family History: The patient's family history includes Cancer in her paternal grandmother; Diabetes in her father; Hyperlipidemia in her mother; Hypertension in her father.  ROS:   All other ROS reviewed and negative. Pertinent positives noted in the HPI.     EKGs/Labs/Other Studies Reviewed:   The following studies were personally reviewed by me today:  EKG:  EKG is ordered today.  The ekg ordered today demonstrates normal sinus rhythm with ventricular bigeminy, PVCs appear to be LVOT possible, and was personally reviewed by me.   TTE 12/24/2019 1. Left ventricular ejection fraction, by estimation, is 30 to 35%. The  left ventricle has moderately decreased function. The left ventricle  demonstrates global hypokinesis. The left ventricular internal cavity size  was moderately dilated. Left  ventricular diastolic parameters are consistent with Grade I diastolic  dysfunction (impaired relaxation).  2. Right ventricular systolic function is normal. The right ventricular  size is normal. There is normal pulmonary artery systolic pressure.  3. Left atrial size was severely dilated.  4. The mitral valve is normal in structure. Moderate mitral valve  regurgitation. No evidence of mitral stenosis.  5. The aortic valve is normal in structure. Aortic valve regurgitation is  not visualized. No aortic stenosis is present.  6. The inferior vena cava is normal in size with greater than 50%  respiratory variability, suggesting right atrial pressure of 3 mmHg.   Recent  Labs: 12/23/2019: ALT 49 12/24/2019: B Natriuretic Peptide 607.9 12/25/2019: TSH 0.341 12/26/2019: Hemoglobin 12.3; Platelets 198 12/27/2019: BUN 11; Creatinine, Ser 0.82; Magnesium 1.9; Potassium 4.2; Sodium 139   Recent Lipid Panel    Component Value Date/Time   CHOL 160 12/25/2019 0807   TRIG 60 12/25/2019 0807   HDL 50 12/25/2019 0807   CHOLHDL 3.2 12/25/2019 0807   VLDL 12 12/25/2019 0807   LDLCALC 98 12/25/2019 0807    Physical Exam:   VS:  BP 116/76   Pulse (!) 109   Ht 5\' 8"  (1.727 m)   Wt 194 lb (88 kg)   LMP 12/05/2019 (Approximate)   SpO2 99%   BMI 29.50 kg/m    Wt  Readings from Last 3 Encounters:  01/04/20 194 lb (88 kg)  01/03/20 190 lb 3.2 oz (86.3 kg)  12/25/19 186 lb 11.7 oz (84.7 kg)    General: Well nourished, well developed, in no acute distress Heart: Atraumatic, normal size  Eyes: PEERLA, EOMI  Neck: Supple, no JVD Endocrine: No thryomegaly Cardiac: Normal S1, S2; irregular rhythm, tachycardia, no murmurs rubs or gallops Lungs: Clear to auscultation bilaterally, no wheezing, rhonchi or rales  Abd: Soft, nontender, no hepatomegaly  Ext: No edema, pulses 2+ Musculoskeletal: No deformities, BUE and BLE strength normal and equal Skin: Warm and dry, no rashes   Neuro: Alert and oriented to person, place, time, and situation, CNII-XII grossly intact, no focal deficits  Psych: Normal mood and affect   ASSESSMENT:   AMYBETH SUTTNER is a 54 y.o. female who presents for the following: 1. Chronic systolic heart failure (HCC)   2. PVC (premature ventricular contraction)     PLAN:   1. Chronic systolic heart failure (HCC) 2. PVC (premature ventricular contraction) -Recently diagnosed with systolic heart failure, EF 30-35%.  No coronary calcium seen on her CT PE study, I have a low suspicion for coronary disease.  She is having frequent PVCs in the hospital and she still is having bigeminy here.  Given her low EF and frequent PVCs I think she should be  considered for cardiac ablation.  I discussed this with Dr. Steffanie Dunn.  We will get her in to see him. -We will go ahead and increase her metoprolol to 100 mg daily.  The following week I want to increase her lisinopril 5 mg daily.  She should stop her amlodipine. -She does report increased cough and some shortness of breath.  I will like for her to take Lasix 40 mg every other day with potassium supplementation.  She will do this.  I think some of her cough at night is related to this. -She did have subclinical hyperthyroidism.  She will follow-up with endocrinology.  Her PCP has plans to recheck these in a few weeks.  Unclear if this is causing her PVCs.  Clearly her frequent PVCs affected her cardiac function.  Disposition: Return in about 3 months (around 04/04/2020).  Medication Adjustments/Labs and Tests Ordered: Current medicines are reviewed at length with the patient today.  Concerns regarding medicines are outlined above.  No orders of the defined types were placed in this encounter. Only want her to be a patient No orders of the defined types were placed in this encounter.   Patient Instructions  Medication Instructions:  Take Lasix 40 every other day  Take Potassium 20 meq every other day  Increase Metoprolol 100 mg daily  Increase Lisinopril 5 mg ( do not start this until next week!)  *If you need a refill on your cardiac medications before your next appointment, please call your pharmacy*   Follow-Up: At [facility], you and your health needs are our priority.  As part of our continuing mission to provide you with exceptional heart care, we have created designated Provider Care Teams.  These Care Teams include your primary Cardiologist (physician) and Advanced Practice Providers (APPs -  Physician Assistants and Nurse Practitioners) who all work together to provide you with the care you need, when you need it.  We recommend signing up for the patient portal called  "MyChart".  Sign up information is provided on this After Visit Summary.  MyChart is used to connect with patients for Virtual Visits (Telemedicine).  Patients are able to view lab/test results, encounter notes, upcoming appointments, etc.  Non-urgent messages can be sent to your provider as well.   To learn more about what you can do with MyChart, go to [email or web address].    Your next appointment:   3 month(s)  The format for your next appointment:   In Person  Provider:   Lennie Odor, MD   Other Instructions  Referral to EP- they will call you to schedule an appointment.     Time Spent with Patient: I have spent a total of 35 minutes with patient reviewing hospital notes, telemetry, EKGs, labs and examining the patient as well as establishing an assessment and plan that was discussed with the patient.  > 50% of time was spent in direct patient care.  Signed, Lenna Gilford. Flora Lipps, MD [facility]  [address], [address] [place], [place] [address] [phone]  01/04/2020 3:51 PM

## 2020-01-03 NOTE — Assessment & Plan Note (Signed)
Recheck TSH next blood draw.

## 2020-01-03 NOTE — Telephone Encounter (Signed)
Spoke with patient daughter, information given Verbalized understanding

## 2020-01-03 NOTE — Telephone Encounter (Signed)
Please advise 

## 2020-01-03 NOTE — Telephone Encounter (Signed)
Patients daughter is calling with concern to patients bronchitis and pneumonia and states we only prescribed her medication for her heart symptoms she's experiencing but nothing to help with the other issues. Daughter states patient is up all night coughing and having trouble breathing and would like a call back regarding this. [phone]

## 2020-01-03 NOTE — Progress Notes (Signed)
Chief Complaint:  Savannah Morales is a 54 y.o. female who presents today for a TCM visit.  Assessment/Plan:  CHF (congestive heart failure) (HCC) No signs of volume overload today.  Will be seeing cardiology tomorrow.  We will continue current medication regimen with metoprolol succinate 50 mg daily and lisinopril 2.5 mg daily.  Blood pressure low today but she is not currently having any symptoms.  Would likely stop amlodipine if we need to increase dose of either of her other antihypertensives or if she develops symptoms.  Low TSH level Recheck TSH next blood draw.  Type 2 diabetes mellitus (HCC) Check A1c in 3 to 6 months.    Subjective:  HPI:  Summary of Hospital admission: Reason for admission: CHF  Date of admission: 12/23/2019 Date of discharge: 12/27/2019 Date of Interactive contact: 12/28/2019 Summary of Hospital course: Patient presented to the hospital with progressive shortness of breath for 4 weeks on 12/23/2019 she was treated for bronchitis about 3 weeks prior to presentation. She additionally started to develop bilateral ankle swelling prior to presentation to the ED. In ED had work-up including negative CTA but was found to have cardiomegaly on CTA. She was admitted for acute heart failure. She was started on IV Lasix. She diuresed 9 pounds during admission. Echocardiogram showed EF of 30 to 35% with grade 1 diastolic dysfunction. CTA showed no coronary calcifications. Symptoms stabilized and she was discharged home. She was started on metoprolol succinate 50 mg daily, lisinopril 2.5 mg daily, and Lasix as needed.  Interim history outlined by problem:   She has done well since being discharged home a week ago.  She has been tolerating her new medications without side effects.  No dizziness.  No lightheadedness.  Shortness of breath and leg swelling have significantly improved.  She still has some cough but this is improving as well.  She will be following up with  cardiology tomorrow.  ROS: Per HPI, otherwise a complete review of systems was negative.   PMH:  The following were reviewed and entered/updated in epic: Past Medical History:  Diagnosis Date  . Diabetes mellitus without complication (HCC)    Type II  . History of chicken pox   . Hypertension   . Kidney stones    Patient Active Problem List   Diagnosis Date Noted  . Low TSH level 12/25/2019  . CHF (congestive heart failure) (HCC) 12/24/2019  . Type 2 diabetes mellitus (HCC) 04/21/2016  . Hypertension associated with diabetes (HCC) 03/17/2016  . H/O urinary stone 03/17/2016   Past Surgical History:  Procedure Laterality Date  . ANKLE SURGERY      Family History  Problem Relation Age of Onset  . Hyperlipidemia Mother   . Hypertension Father   . Diabetes Father   . Cancer Paternal Grandmother     Medications- Reconciled discharge and current medications in Epic.  Current Outpatient Medications  Medication Sig Dispense Refill  . benzoyl peroxide-erythromycin (BENZAMYCIN) gel Apply 1 application topically daily.     [name] ELDERBERRY PO Take 2 tablets by mouth daily.     . furosemide (LASIX) 40 MG tablet Take 1 tablet (40 mg total) by mouth daily as needed for fluid or edema. 10 tablet 0  . Garlic 1000 MG CAPS Take 1 capsule by mouth daily.     [name] lisinopril (ZESTRIL) 2.5 MG tablet Take 1 tablet (2.5 mg total) by mouth daily. 30 tablet 0  . metFORMIN (GLUCOPHAGE-XR) 500 MG 24 hr tablet TAKE 1  TABLET BY MOUTH DAILY WITH BREAKFAST (Patient taking differently: Take 500 mg by mouth daily with breakfast. ) 60 tablet 2  . metoprolol succinate (TOPROL-XL) 50 MG 24 hr tablet Take 1 tablet (50 mg total) by mouth daily. Take with or immediately following a meal. 30 tablet 0  . Omega-3 Fatty Acids (FISH OIL) 1200 MG CPDR Take 2 capsules by mouth daily.     . potassium chloride 20 MEQ TBCR Take 10 mEq by mouth daily as needed (Take when you take a dose of Lasix). 10 tablet 0  . acetaminophen  (TYLENOL) 500 MG tablet Take 1,000 mg by mouth every 6 (six) hours as needed for mild pain. (Patient not taking: Reported on 01/03/2020)    . amLODipine (NORVASC) 10 MG tablet Take 1 tablet (10 mg total) by mouth daily. 90 tablet 2   No current facility-administered medications for this visit.    Allergies-reviewed and updated No Known Allergies  Social History   Socioeconomic History  . Marital status: Divorced    Spouse name: Not on file  . Number of children: 3  . Years of education: 40  . Highest education level: Not on file  Occupational History  . Occupation: Actor   Tobacco Use  . Smoking status: Never Smoker  . Smokeless tobacco: Never Used  Vaping Use  . Vaping Use: Never used  Substance and Sexual Activity  . Alcohol use: Not Currently    Comment: Rarely  . Drug use: No  . Sexual activity: Yes  Other Topics Concern  . Not on file  Social History Narrative  . Not on file   Social Determinants of Health   Financial Resource Strain:   . Difficulty of Paying Living Expenses: Not on file  Food Insecurity:   . Worried About Programme researcher, broadcasting/film/video in the Last Year: Not on file  . Ran Out of Food in the Last Year: Not on file  Transportation Needs:   . Lack of Transportation (Medical): Not on file  . Lack of Transportation (Non-Medical): Not on file  Physical Activity:   . Days of Exercise per Week: Not on file  . Minutes of Exercise per Session: Not on file  Stress:   . Feeling of Stress : Not on file  Social Connections:   . Frequency of Communication with Friends and Family: Not on file  . Frequency of Social Gatherings with Friends and Family: Not on file  . Attends Religious Services: Not on file  . Active Member of Clubs or Organizations: Not on file  . Attends Banker Meetings: Not on file  . Marital Status: Not on file        Objective:  Physical Exam: BP (!) 86/61   Pulse (!) 52   Temp (!) 97.5 F (36.4 C)  (Temporal)   Ht 5\' 8"  (1.727 m)   Wt 190 lb 3.2 oz (86.3 kg)   LMP 12/05/2019 (Approximate)   SpO2 98%   BMI 28.92 kg/m   Wt Readings from Last 3 Encounters:  01/03/20 190 lb 3.2 oz (86.3 kg)  12/25/19 186 lb 11.7 oz (84.7 kg)  11/24/19 193 lb (87.5 kg)   Gen: NAD, resting comfortably CV: RRR with no murmurs appreciated Pulm: NWOB, CTAB with no crackles, wheezes, or rhonchi GI: Normal bowel sounds present. Soft, Nontender, Nondistended. MSK: No edema, cyanosis, or clubbing noted Skin: Warm, dry Neuro: Grossly normal, moves all extremities Psych: Normal affect and thought content  Katina Degree. [name], MD 01/03/2020 8:59 AM

## 2020-01-04 ENCOUNTER — Ambulatory Visit (INDEPENDENT_AMBULATORY_CARE_PROVIDER_SITE_OTHER): Payer: 59 | Admitting: Cardiovascular Disease

## 2020-01-04 ENCOUNTER — Encounter: Payer: Self-pay | Admitting: Cardiovascular Disease

## 2020-01-04 VITALS — BP 116/76 | HR 109 | Ht 68.0 in | Wt 194.0 lb

## 2020-01-04 DIAGNOSIS — I493 Ventricular premature depolarization: Secondary | ICD-10-CM

## 2020-01-04 DIAGNOSIS — I5022 Chronic systolic (congestive) heart failure: Secondary | ICD-10-CM | POA: Diagnosis not present

## 2020-01-04 MED ORDER — LISINOPRIL 5 MG PO TABS: 5.0000 mg | ORAL_TABLET | Freq: Every day | ORAL | 1 refills | Status: DC

## 2020-01-04 MED ORDER — POTASSIUM CHLORIDE ER 20 MEQ PO TBCR: 20.0000 meq | EXTENDED_RELEASE_TABLET | ORAL | 1 refills | Status: DC

## 2020-01-04 MED ORDER — METOPROLOL SUCCINATE ER 50 MG PO TB24: 100.0000 mg | ORAL_TABLET | Freq: Every day | ORAL | 1 refills | Status: DC

## 2020-01-04 MED ORDER — FUROSEMIDE 40 MG PO TABS: 40.0000 mg | ORAL_TABLET | ORAL | 1 refills | Status: DC

## 2020-01-04 NOTE — Patient Instructions (Addendum)
Medication Instructions:  Take Lasix 40 every other day  Take Potassium 20 meq every other day  Increase Metoprolol 100 mg daily  Increase Lisinopril 5 mg ( do not start this until next week!)  *If you need a refill on your cardiac medications before your next appointment, please call your pharmacy*   Follow-Up: At [facility], you and your health needs are our priority.  As part of our continuing mission to provide you with exceptional heart care, we have created designated Provider Care Teams.  These Care Teams include your primary Cardiologist (physician) and Advanced Practice Providers (APPs -  Physician Assistants and Nurse Practitioners) who all work together to provide you with the care you need, when you need it.  We recommend signing up for the patient portal called "MyChart".  Sign up information is provided on this After Visit Summary.  MyChart is used to connect with patients for Virtual Visits (Telemedicine).  Patients are able to view lab/test results, encounter notes, upcoming appointments, etc.  Non-urgent messages can be sent to your provider as well.   To learn more about what you can do with MyChart, go to [email or web address].    Your next appointment:   3 month(s)  The format for your next appointment:   In Person  Provider:   Lennie Odor, MD   Other Instructions  Referral to EP- they will call you to schedule an appointment.

## 2020-01-05 ENCOUNTER — Telehealth: Payer: Self-pay | Admitting: Family Medicine

## 2020-01-05 ENCOUNTER — Other Ambulatory Visit: Payer: Self-pay | Admitting: *Deleted

## 2020-01-05 NOTE — Telephone Encounter (Signed)
Patient is calling in asking if the paperwork has been filled out, states that she is desperately needing it so she is able to come back in the state to see her.

## 2020-01-05 NOTE — Patient Outreach (Signed)
[facility]) Care Management  01/05/2020  Savannah Morales 10-Oct-1965 [id number]   Telephone Assessment-Unsuccessful  RN attempted outreach call however unsuccessful on the 2nd attempt. RN able to leave a HIPAA approved voice message requesting a call back. Will engage further with any call backs.  Plan: Will scheduled another outreach call next week for pending services with [facility].  Elliot Cousin, RN Care Management Coordinator [facility] Main Office [phone]

## 2020-01-08 NOTE — Progress Notes (Signed)
Electrophysiology Office Note:    Date:  01/08/2020   ID:  Savannah Morales, DOB November 25, 1965, MRN [id number]  PCP:  Ardith Dark, MD  [facility] Cardiologist:  Reatha Harps, MD  [facility] Electrophysiologist:  Lanier Prude, MD   Referring MD: Sande Rives, *   Chief Complaint: PVC  History of Present Illness:    Savannah Morales is a 54 y.o. female who presents to clinic for an evaluation of PVCs at the request of Dr Flora Lipps. Her medical history includes DM and new onset systolic heart failure (EF 30-35%). She was recently hospitalized with HF symptoms. Today she describes NYHA class II symptoms. She has occasional swelling in her bilateral lower extremities and is sometimes dyspneic on exertion. No syncope, presyncope.  Past Medical History:  Diagnosis Date  . Diabetes mellitus without complication (HCC)    Type II  . History of chicken pox   . Hypertension   . Kidney stones     Past Surgical History:  Procedure Laterality Date  . ANKLE SURGERY      Current Medications: No outpatient medications have been marked as taking for the 01/09/20 encounter (Appointment) with Lanier Prude, MD.     Allergies:   Patient has no known allergies.   Social History   Socioeconomic History  . Marital status: Divorced    Spouse name: Not on file  . Number of children: 3  . Years of education: 40  . Highest education level: Not on file  Occupational History  . Occupation: Actor   Tobacco Use  . Smoking status: Never Smoker  . Smokeless tobacco: Never Used  Vaping Use  . Vaping Use: Never used  Substance and Sexual Activity  . Alcohol use: Not Currently    Comment: Rarely  . Drug use: No  . Sexual activity: Yes  Other Topics Concern  . Not on file  Social History Narrative  . Not on file   Social Determinants of Health   Financial Resource Strain:   . Difficulty of Paying Living Expenses: Not on file  Food  Insecurity:   . Worried About Programme researcher, broadcasting/film/video in the Last Year: Not on file  . Ran Out of Food in the Last Year: Not on file  Transportation Needs:   . Lack of Transportation (Medical): Not on file  . Lack of Transportation (Non-Medical): Not on file  Physical Activity:   . Days of Exercise per Week: Not on file  . Minutes of Exercise per Session: Not on file  Stress:   . Feeling of Stress : Not on file  Social Connections:   . Frequency of Communication with Friends and Family: Not on file  . Frequency of Social Gatherings with Friends and Family: Not on file  . Attends Religious Services: Not on file  . Active Member of Clubs or Organizations: Not on file  . Attends Banker Meetings: Not on file  . Marital Status: Not on file     Family History: The patient's family history includes Cancer in her paternal grandmother; Diabetes in her father; Hyperlipidemia in her mother; Hypertension in her father.  ROS:   Please see the history of present illness.    All other systems reviewed and are negative.  EKGs/Labs/Other Studies Reviewed:    The following studies were reviewed today: Echo, ECG  12/24/2019 Echo personally reviewed by me 1. Left ventricular ejection fraction, by estimation, is 30 to 35%. The  left ventricle has moderately decreased function. The left ventricle  demonstrates global hypokinesis. The left ventricular internal cavity size  was moderately dilated. Left  ventricular diastolic parameters are consistent with Grade I diastolic  dysfunction (impaired relaxation).  2. Right ventricular systolic function is normal. The right ventricular  size is normal. There is normal pulmonary artery systolic pressure.  3. Left atrial size was severely dilated.  4. The mitral valve is normal in structure. Moderate mitral valve  regurgitation. No evidence of mitral stenosis.  5. The aortic valve is normal in structure. Aortic valve regurgitation is  not  visualized. No aortic stenosis is present.  6. The inferior vena cava is normal in size with greater than 50%  respiratory variability, suggesting right atrial pressure of 3 mmHg.  01/04/2020 ECG  Sinus with frequent PVC.    Recent Labs: 12/23/2019: ALT 49 12/24/2019: B Natriuretic Peptide 607.9 12/25/2019: TSH 0.341 12/26/2019: Hemoglobin 12.3; Platelets 198 12/27/2019: BUN 11; Creatinine, Ser 0.82; Magnesium 1.9; Potassium 4.2; Sodium 139  Recent Lipid Panel    Component Value Date/Time   CHOL 160 12/25/2019 0807   TRIG 60 12/25/2019 0807   HDL 50 12/25/2019 0807   CHOLHDL 3.2 12/25/2019 0807   VLDL 12 12/25/2019 0807   LDLCALC 98 12/25/2019 0807    Physical Exam:    VS:  There were no vitals taken for this visit.    Wt Readings from Last 3 Encounters:  01/04/20 194 lb (88 kg)  01/03/20 190 lb 3.2 oz (86.3 kg)  12/25/19 186 lb 11.7 oz (84.7 kg)     GEN: Well nourished, well developed in no acute distress HEENT: Normal NECK: No JVD; No carotid bruits LYMPHATICS: No lymphadenopathy CARDIAC: RRR, no murmurs, rubs, gallops RESPIRATORY:  Clear to auscultation without rales, wheezing or rhonchi  ABDOMEN: Soft, non-tender, non-distended MUSCULOSKELETAL:  No edema; No deformity  SKIN: Warm and dry NEUROLOGIC:  Alert and oriented x 3 PSYCHIATRIC:  Normal affect   ASSESSMENT:    1. Chronic systolic heart failure (HCC)   2. PVC (premature ventricular contraction)    PLAN:    In order of problems listed above:  1. Chronic systolic heart failure, EF 30% NYHA Class II symptoms. On good medical therapy. Recommend continuing metoprolol, lisinopril, lasix. Pending MRI and ablation procedure success, will need to consider defibrillator therapy in the future.  2. 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 Appear outflow tract in origin (LVOT>RVOT). Would like to get a cMR to assess for fibrosis. Discussed treatment options for the PVC including  pharmacologic suppression vs ablation therapy. Based on the EKG morphology, I do think the PVC is originating from an accessible area in the outflow tracts. Patient would like to pursue ablation therapy. Risks, benefits and alternatives were discussed with the patient length including risks associated with arterial mapping and ablation, possibility that the PVCs noninducible in the lab, possibility that the PVC is not accessible to radiofrequency energy.  Risk, benefits, and alternatives to EP study and radiofrequency ablation for PVCs were also discussed in detail today. These risks include but are not limited to stroke, bleeding, vascular damage, tamponade, perforation, damage to the cardiac valvular apparatus, worsening renal function, and death. The patient understands these risks and wishes to proceed.  We will therefore proceed with catheter ablation at the next available time.  Carto, ICE, anesthesia are requested for the procedure.  Will plan for MRI prior to the procedure.   Medication Adjustments/Labs and Tests Ordered: Current  medicines are reviewed at length with the patient today.  Concerns regarding medicines are outlined above.  No orders of the defined types were placed in this encounter.  No orders of the defined types were placed in this encounter.   There are no Patient Instructions on file for this visit.   Signed, Steffanie Dunn, MD, [facility]  01/08/2020 6:56 PM    Electrophysiology [facility]      Anticoagulation instructions: The patient is not on anticoagulation.  Medication instructions morning of: The patient should take their medications the morning of the procedure, except for any diuretic (lasix, torsemide, or bumex). Please hold the metoprolol for the 2 days prior to the procedure.   Discharge: Our plan will be to discharge the patient same day after a period of observation

## 2020-01-08 NOTE — Telephone Encounter (Signed)
Patient's daughter is calling in asking for an update, as she is on a time crunch. Is needing to be here for her mom's heart surgery.

## 2020-01-08 NOTE — H&P (View-Only) (Signed)
Electrophysiology Office Note:    Date:  01/08/2020   ID:  Savannah Morales, DOB November 25, 1965, MRN [id number]  PCP:  Ardith Dark, MD  [facility] Cardiologist:  Reatha Harps, MD  [facility] Electrophysiologist:  Lanier Prude, MD   Referring MD: Sande Rives, *   Chief Complaint: PVC  History of Present Illness:    Savannah Morales is a 54 y.o. female who presents to clinic for an evaluation of PVCs at the request of Dr Flora Lipps. Her medical history includes DM and new onset systolic heart failure (EF 30-35%). She was recently hospitalized with HF symptoms. Today she describes NYHA class II symptoms. She has occasional swelling in her bilateral lower extremities and is sometimes dyspneic on exertion. No syncope, presyncope.  Past Medical History:  Diagnosis Date  . Diabetes mellitus without complication (HCC)    Type II  . History of chicken pox   . Hypertension   . Kidney stones     Past Surgical History:  Procedure Laterality Date  . ANKLE SURGERY      Current Medications: No outpatient medications have been marked as taking for the 01/09/20 encounter (Appointment) with Lanier Prude, MD.     Allergies:   Patient has no known allergies.   Social History   Socioeconomic History  . Marital status: Divorced    Spouse name: Not on file  . Number of children: 3  . Years of education: 40  . Highest education level: Not on file  Occupational History  . Occupation: Actor   Tobacco Use  . Smoking status: Never Smoker  . Smokeless tobacco: Never Used  Vaping Use  . Vaping Use: Never used  Substance and Sexual Activity  . Alcohol use: Not Currently    Comment: Rarely  . Drug use: No  . Sexual activity: Yes  Other Topics Concern  . Not on file  Social History Narrative  . Not on file   Social Determinants of Health   Financial Resource Strain:   . Difficulty of Paying Living Expenses: Not on file  Food  Insecurity:   . Worried About Programme researcher, broadcasting/film/video in the Last Year: Not on file  . Ran Out of Food in the Last Year: Not on file  Transportation Needs:   . Lack of Transportation (Medical): Not on file  . Lack of Transportation (Non-Medical): Not on file  Physical Activity:   . Days of Exercise per Week: Not on file  . Minutes of Exercise per Session: Not on file  Stress:   . Feeling of Stress : Not on file  Social Connections:   . Frequency of Communication with Friends and Family: Not on file  . Frequency of Social Gatherings with Friends and Family: Not on file  . Attends Religious Services: Not on file  . Active Member of Clubs or Organizations: Not on file  . Attends Banker Meetings: Not on file  . Marital Status: Not on file     Family History: The patient's family history includes Cancer in her paternal grandmother; Diabetes in her father; Hyperlipidemia in her mother; Hypertension in her father.  ROS:   Please see the history of present illness.    All other systems reviewed and are negative.  EKGs/Labs/Other Studies Reviewed:    The following studies were reviewed today: Echo, ECG  12/24/2019 Echo personally reviewed by me 1. Left ventricular ejection fraction, by estimation, is 30 to 35%. The  left ventricle has moderately decreased function. The left ventricle  demonstrates global hypokinesis. The left ventricular internal cavity size  was moderately dilated. Left  ventricular diastolic parameters are consistent with Grade I diastolic  dysfunction (impaired relaxation).  2. Right ventricular systolic function is normal. The right ventricular  size is normal. There is normal pulmonary artery systolic pressure.  3. Left atrial size was severely dilated.  4. The mitral valve is normal in structure. Moderate mitral valve  regurgitation. No evidence of mitral stenosis.  5. The aortic valve is normal in structure. Aortic valve regurgitation is  not  visualized. No aortic stenosis is present.  6. The inferior vena cava is normal in size with greater than 50%  respiratory variability, suggesting right atrial pressure of 3 mmHg.  01/04/2020 ECG  Sinus with frequent PVC.    Recent Labs: 12/23/2019: ALT 49 12/24/2019: B Natriuretic Peptide 607.9 12/25/2019: TSH 0.341 12/26/2019: Hemoglobin 12.3; Platelets 198 12/27/2019: BUN 11; Creatinine, Ser 0.82; Magnesium 1.9; Potassium 4.2; Sodium 139  Recent Lipid Panel    Component Value Date/Time   CHOL 160 12/25/2019 0807   TRIG 60 12/25/2019 0807   HDL 50 12/25/2019 0807   CHOLHDL 3.2 12/25/2019 0807   VLDL 12 12/25/2019 0807   LDLCALC 98 12/25/2019 0807    Physical Exam:    VS:  There were no vitals taken for this visit.    Wt Readings from Last 3 Encounters:  01/04/20 194 lb (88 kg)  01/03/20 190 lb 3.2 oz (86.3 kg)  12/25/19 186 lb 11.7 oz (84.7 kg)     GEN: Well nourished, well developed in no acute distress HEENT: Normal NECK: No JVD; No carotid bruits LYMPHATICS: No lymphadenopathy CARDIAC: RRR, no murmurs, rubs, gallops RESPIRATORY:  Clear to auscultation without rales, wheezing or rhonchi  ABDOMEN: Soft, non-tender, non-distended MUSCULOSKELETAL:  No edema; No deformity  SKIN: Warm and dry NEUROLOGIC:  Alert and oriented x 3 PSYCHIATRIC:  Normal affect   ASSESSMENT:    1. Chronic systolic heart failure (HCC)   2. PVC (premature ventricular contraction)    PLAN:    In order of problems listed above:  1. Chronic systolic heart failure, EF 30% NYHA Class II symptoms. On good medical therapy. Recommend continuing metoprolol, lisinopril, lasix. Pending MRI and ablation procedure success, will need to consider defibrillator therapy in the future.  2. 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 Appear outflow tract in origin (LVOT>RVOT). Would like to get a cMR to assess for fibrosis. Discussed treatment options for the PVC including  pharmacologic suppression vs ablation therapy. Based on the EKG morphology, I do think the PVC is originating from an accessible area in the outflow tracts. Patient would like to pursue ablation therapy. Risks, benefits and alternatives were discussed with the patient length including risks associated with arterial mapping and ablation, possibility that the PVCs noninducible in the lab, possibility that the PVC is not accessible to radiofrequency energy.  Risk, benefits, and alternatives to EP study and radiofrequency ablation for PVCs were also discussed in detail today. These risks include but are not limited to stroke, bleeding, vascular damage, tamponade, perforation, damage to the cardiac valvular apparatus, worsening renal function, and death. The patient understands these risks and wishes to proceed.  We will therefore proceed with catheter ablation at the next available time.  Carto, ICE, anesthesia are requested for the procedure.  Will plan for MRI prior to the procedure.   Medication Adjustments/Labs and Tests Ordered: Current  medicines are reviewed at length with the patient today.  Concerns regarding medicines are outlined above.  No orders of the defined types were placed in this encounter.  No orders of the defined types were placed in this encounter.   There are no Patient Instructions on file for this visit.   Signed, Steffanie Dunn, MD, [facility]  01/08/2020 6:56 PM    Electrophysiology  [facility]      Anticoagulation instructions: The patient is not on anticoagulation.  Medication instructions morning of: The patient should take their medications the morning of the procedure, except for any diuretic (lasix, torsemide, or bumex). Please hold the metoprolol for the 2 days prior to the procedure.   Discharge: Our plan will be to discharge the patient same day after a period of observation

## 2020-01-09 ENCOUNTER — Encounter: Payer: Self-pay | Admitting: Cardiology

## 2020-01-09 ENCOUNTER — Ambulatory Visit (INDEPENDENT_AMBULATORY_CARE_PROVIDER_SITE_OTHER): Payer: 59 | Admitting: Cardiology

## 2020-01-09 ENCOUNTER — Other Ambulatory Visit: Payer: Self-pay

## 2020-01-09 ENCOUNTER — Telehealth: Payer: Self-pay

## 2020-01-09 VITALS — BP 98/64 | HR 84 | Ht 68.0 in | Wt 189.0 lb

## 2020-01-09 DIAGNOSIS — I493 Ventricular premature depolarization: Secondary | ICD-10-CM | POA: Diagnosis not present

## 2020-01-09 DIAGNOSIS — I5022 Chronic systolic (congestive) heart failure: Secondary | ICD-10-CM

## 2020-01-09 NOTE — Telephone Encounter (Signed)
Pt is scheduled for PVC ablation on January 31, 2020 at 8:30 am  Labs/covid test scheduled  Instruction letter sent via mychart.  Work up complete.

## 2020-01-09 NOTE — Patient Instructions (Addendum)
Medication Instructions:  Your physician recommends that you continue on your current medications as directed. Please refer to the Current Medication list given to you today.  Lab Work: None ordered.  If you have labs (blood work) drawn today and your tests are completely normal, you will receive your results only by: [name] MyChart Message (if you have MyChart) OR . A paper copy in the mail If you have any lab test that is abnormal or we need to change your treatment, we will call you to review the results.   Testing/Procedures: Your physician has requested that you have a cardiac MRI. Cardiac MRI uses a computer to create images of your heart as its beating, producing both still and moving pictures of your heart and major blood vessels.   Follow-Up: At [facility], you and your health needs are our priority.  As part of our continuing mission to provide you with exceptional heart care, we have created designated Provider Care Teams.  These Care Teams include your primary Cardiologist (physician) and Advanced Practice Providers (APPs -  Physician Assistants and Nurse Practitioners) who all work together to provide you with the care you need, when you need it.  Your next appointment:    Your physician wants you to follow-up in: 4 weeks after your ablation   Cardiac Ablation Cardiac ablation is a procedure to disable (ablate) a small amount of heart tissue in very specific places. The heart has many electrical connections. Sometimes these connections are abnormal and can cause the heart to beat very fast or irregularly. Ablating some of the problem areas can improve the heart rhythm or return it to normal. Ablation may be done for people who:  Have Wolff-Parkinson-White syndrome.  Have fast heart rhythms (tachycardia).  Have taken medicines for an abnormal heart rhythm (arrhythmia) that were not effective or caused side effects.  Have a high-risk heartbeat that may be  life-threatening. During the procedure, a small incision is made in the neck or the groin, and a long, thin, flexible tube (catheter) is inserted into the incision and moved to the heart. Small devices (electrodes) on the tip of the catheter will send out electrical currents. A type of X-ray (fluoroscopy) will be used to help guide the catheter and to provide images of the heart. Tell a health care provider about:  Any allergies you have.  All medicines you are taking, including vitamins, herbs, eye drops, creams, and over-the-counter medicines.  Any problems you or family members have had with anesthetic medicines.  Any blood disorders you have.  Any surgeries you have had.  Any medical conditions you have, such as kidney failure.  Whether you are pregnant or may be pregnant. What are the risks? Generally, this is a safe procedure. However, problems may occur, including:  Infection.  Bruising and bleeding at the catheter insertion site.  Bleeding into the chest, especially into the sac that surrounds the heart. This is a serious complication.  Stroke or blood clots.  Damage to other structures or organs.  Allergic reaction to medicines or dyes.  Need for a permanent pacemaker if the normal electrical system is damaged. A pacemaker is a small computer that sends electrical signals to the heart and helps your heart beat normally.  The procedure not being fully effective. This may not be recognized until months later. Repeat ablation procedures are sometimes required. What happens before the procedure?  Follow instructions from your health care provider about eating or drinking restrictions.  Ask your health  care provider about: ? Changing or stopping your regular medicines. This is especially important if you are taking diabetes medicines or blood thinners. ? Taking medicines such as aspirin and ibuprofen. These medicines can thin your blood. Do not take these medicines before  your procedure if your health care provider instructs you not to.  Plan to have someone take you home from the hospital or clinic.  If you will be going home right after the procedure, plan to have someone with you for 24 hours. What happens during the procedure?  To lower your risk of infection: ? Your health care team will wash or sanitize their hands. ? Your skin will be washed with soap. ? Hair may be removed from the incision area.  An IV tube will be inserted into one of your veins.  You will be given a medicine to help you relax (sedative).  The skin on your neck or groin will be numbed.  An incision will be made in your neck or your groin.  A needle will be inserted through the incision and into a large vein in your neck or groin.  A catheter will be inserted into the needle and moved to your heart.  Dye may be injected through the catheter to help your surgeon see the area of the heart that needs treatment.  Electrical currents will be sent from the catheter to ablate heart tissue in desired areas. There are three types of energy that may be used to ablate heart tissue: ? Heat (radiofrequency energy). ? Laser energy. ? Extreme cold (cryoablation).  When the necessary tissue has been ablated, the catheter will be removed.  Pressure will be held on the catheter insertion area to prevent excessive bleeding.  A bandage (dressing) will be placed over the catheter insertion area. The procedure may vary among health care providers and hospitals. What happens after the procedure?  Your blood pressure, heart rate, breathing rate, and blood oxygen level will be monitored until the medicines you were given have worn off.  Your catheter insertion area will be monitored for bleeding. You will need to lie still for a few hours to ensure that you do not bleed from the catheter insertion area.  Do not drive for 24 hours or as long as directed by your health care  provider. Summary  Cardiac ablation is a procedure to disable (ablate) a small amount of heart tissue in very specific places. Ablating some of the problem areas can improve the heart rhythm or return it to normal.  During the procedure, electrical currents will be sent from the catheter to ablate heart tissue in desired areas. This information is not intended to replace advice given to you by your health care provider. Make sure you discuss any questions you have with your health care provider. Document Revised: 10/04/2017 Document Reviewed: 03/02/2016 Elsevier Patient Education  2020 [employer].

## 2020-01-10 ENCOUNTER — Telehealth (HOSPITAL_COMMUNITY): Payer: Self-pay | Admitting: Emergency Medicine

## 2020-01-10 ENCOUNTER — Telehealth: Payer: Self-pay | Admitting: *Deleted

## 2020-01-10 NOTE — Telephone Encounter (Signed)
Left message for patient to call regarding appointment for Cardiac MRI scheduled Friday 01/12/20 at 9:00 am at [facility]

## 2020-01-10 NOTE — Telephone Encounter (Signed)
Patient returned my call regarding the appointment for Cardiac MRI scheduled Friday 01/12/20 at 9:00 am---arrival time is 8:30 am 1st floor admissions office for check in.  Patient voiced her understanding.

## 2020-01-10 NOTE — Telephone Encounter (Signed)
Attempted to call patient regarding upcoming cardiac CT appointment. °Left message on voicemail with name and callback number °Bates Collington RN Navigator Cardiac Imaging °[facility] °[phone] Office °[phone] Cell ° °

## 2020-01-10 NOTE — Telephone Encounter (Signed)
FMLA for send to Pt surgeon

## 2020-01-11 ENCOUNTER — Encounter: Payer: Self-pay | Admitting: *Deleted

## 2020-01-11 ENCOUNTER — Other Ambulatory Visit: Payer: Self-pay | Admitting: *Deleted

## 2020-01-11 ENCOUNTER — Telehealth: Payer: Self-pay | Admitting: Cardiology

## 2020-01-11 ENCOUNTER — Telehealth (HOSPITAL_COMMUNITY): Payer: Self-pay | Admitting: Emergency Medicine

## 2020-01-11 NOTE — Patient Outreach (Signed)
[facility]) Care Management  01/11/2020  DEALIE KOELZER [birth year] [id number]   Telephone Assessment-Unsuccessful  RN has made several outreach attempts however unsuccessful. RN able to leave a HIPAA approved voice message requesting a call back.  Plan: Will further engage at that time on possible needs. Will completed another outreach call next month for pending services.  Elliot Cousin, RN Care Management Coordinator [facility] Main Office [phone]

## 2020-01-11 NOTE — Telephone Encounter (Signed)
Reaching out to patient to offer assistance regarding upcoming cardiac imaging study; pt verbalizes understanding of appt date/time, parking situation and where to check in,and verified current allergies; name and call back number provided for further questions should they arise Mate Alegria RN Navigator Cardiac Imaging [facility] [phone] office [phone] cell   Pt denies claustro, denies implants 

## 2020-01-11 NOTE — Telephone Encounter (Signed)
Daughter is faxing over some paperwork that she needs Dr. Lalla Brothers to sign regarding her mother's upcoming ablation. She states she needs it signed and sent back by 01/17/20.

## 2020-01-11 NOTE — Patient Outreach (Signed)
[facility]) Care Management  01/11/2020  VIRIDIANA SPAID 09-17-1965 [id number]  Telephone Assessment-Successful-Enrolled-HF  RN spoke with pt in detail concerning [facility] services and the purpose for the outreach. Pt very receptive and interested in the program and available services. RN discussed some of the pt's medical issues as she explained a pending ablation on 10/6 and MRI pending tomorrow to verify her upcoming procedure. Pt explains HF is new and she is interested in learning more. RN explained HF and the HF actions related to any symptoms. Verified pt is in the GREEN zone today with no precipitating symptoms. Explained the importance of contacting her provider with any fluid retention related to 3 lbs overnight or 5 lbs within one week that can lead to SOB due to the fluid retention (pt verbalized an understanding). Pt is aware of the HF medication and how this assist with the fluid retention. Pt denies any swelling or related issues with her breathing today. Educated pt on when to reach out to her provider with any precipitating symptoms. Discussed what is acute and what to do by call 911 if she has severe symptoms and what those symptoms may involve. Pt verbalized an understanding to all discussed today and was very appreciative. Offered to send HF packet and educational printable information on HF as reviewed today along with a [facility] calendar to document all recorded weights for her providers to view. Pt aware to empty her bladder prior to her daily weights and all other goals on the HF plan of care generated today.  Plan: Will send outreach letter, welcome letter and alert her primary provider of her enrollment into the [facility] program and services. Will follow up in 2 weeks to obtain additional assessment information and reiterate on the plan of care. No additional needs or issues to address at this time.   Goals Addressed            This Visit's Progress   . [facility] Enrollment  9/16       CARE PLAN ENTRY (see longitudinal plan of care for additional care plan information)   Current Barriers:  [name] Knowledge deficit related to basic heart failure pathophysiology and self care management . Knowledge Deficits related to heart failure medications  Case Manager Clinical Goal(s):  [name] Over the next 90 days, patient will verbalize understanding of Heart Failure Action Plan and when to call doctor . Over the next 30 days, patient will weigh daily and record (notifying MD of 3 lb weight gain over night or 5 lb in a week) . Patient will verbalize two symptoms of CHF exacerbation within the next 30 days.    Interventions:  . Basic overview and discussion of pathophysiology of Heart Failure reviewed  . Provided verbal education on low sodium diet . Reviewed Heart Failure Action Plan in depth and provided written copy . Advised patient to weigh each morning after emptying bladder . Discussed importance of daily weight and advised patient to weigh and record daily . Reviewed role of diuretics in prevention of fluid overload and management of heart failure  Patient Self Care Activities:  . Takes Heart Failure Medications as prescribed . Weighs daily and record (notifying MD of 3 lb weight gain over night or 5 lb in a week) . Verbalizes understanding of and follows CHF Action Plan . Adheres to low sodium diet  Initial goal documentation        Elliot Cousin, RN Care Management Coordinator [facility] Main Office [phone]

## 2020-01-12 ENCOUNTER — Ambulatory Visit (HOSPITAL_COMMUNITY)
Admission: RE | Admit: 2020-01-12 | Discharge: 2020-01-12 | Disposition: A | Discharge status: Home / Self Care | Payer: 59 | Source: Ambulatory Visit | Attending: Cardiology | Admitting: Cardiology

## 2020-01-12 ENCOUNTER — Telehealth: Payer: Self-pay | Admitting: Cardiology

## 2020-01-12 ENCOUNTER — Other Ambulatory Visit: Payer: Self-pay

## 2020-01-12 DIAGNOSIS — I5022 Chronic systolic (congestive) heart failure: Secondary | ICD-10-CM | POA: Insufficient documentation

## 2020-01-12 DIAGNOSIS — I493 Ventricular premature depolarization: Secondary | ICD-10-CM | POA: Diagnosis present

## 2020-01-12 MED ORDER — GADOBUTROL 1 MMOL/ML IV SOLN
10.0000 mL | Freq: Once | INTRAVENOUS | Status: AC | PRN
  Administered 2020-01-12: 10 mL via INTRAVENOUS

## 2020-01-12 NOTE — Telephone Encounter (Signed)
Forms from [employer] received on 01/12/20. Completed patient auth attached. Took form to Dr. Lovena Neighbours box for completion. 01/12/20 vlm

## 2020-01-16 NOTE — Telephone Encounter (Signed)
Follow Up:     Daughter is calling to fi her papers are ready and been sent please.

## 2020-01-16 NOTE — Telephone Encounter (Signed)
Daughter of the patient called to check on the status of the paperwork

## 2020-01-16 NOTE — Telephone Encounter (Signed)
Paper work completed and given back to MR for processing.

## 2020-01-18 ENCOUNTER — Telehealth: Payer: Self-pay | Admitting: Cardiology

## 2020-01-18 NOTE — Telephone Encounter (Signed)
Form completed and faxed to [employer] on 01/18/20. 01/18/20 vlm

## 2020-01-24 ENCOUNTER — Telehealth: Payer: Self-pay | Admitting: Cardiology

## 2020-01-24 NOTE — Telephone Encounter (Signed)
Patient's daughter called to check on status of [employer] forms wants to know if they have been faxed back.

## 2020-01-24 NOTE — Telephone Encounter (Signed)
Patient's daughter calling back stating she spoke with medical records who told her they do not have the paperwork. She needs to speak with Boneta Lucks today, because she states the patient has her procedure 01/31/2020.

## 2020-01-24 NOTE — Telephone Encounter (Signed)
Per Vanessa's phone note, paperwork was faxed to [employer] on 01/18/20

## 2020-01-24 NOTE — Telephone Encounter (Signed)
Medical records advised of Pt's daughter returning call.  No action needed by this nurse.  Paperwork was completed.

## 2020-01-24 NOTE — Telephone Encounter (Signed)
Patient's daughter, Toni Amend, is calling to follow up regarding FMLA paperwork. She states she is requesting to speak with Dr. Lovena Neighbours nurse specifically.

## 2020-01-25 ENCOUNTER — Other Ambulatory Visit: Payer: Self-pay | Admitting: *Deleted

## 2020-01-25 NOTE — Patient Outreach (Signed)
[facility]) Care Management  01/25/2020  Savannah Morales 1965/09/08 [id number]   Telephone Assessment-Successful-HF  RN reports she is doing well with no acute issues. Pt repots her weights around 189-190 lbs with no precipitating symptoms. Continue adherence with her HF management of care and reports some changes in her dietary habit (healthier eating habits). Plan of care discussed with pt's understanding of all goals and interventions currently in place.   Will encouraged use of all tools and documentation record to be used to assist with pt's daily management of care related to her HF. Will follow up next month on pt's progress and continue to offer available resources to assist with keeping pt on track with her ongoing management of care.  Goals Addressed            This Visit's Progress   . COMPLETED: [facility] Enrollment 9/16       CARE PLAN ENTRY (see longitudinal plan of care for additional care plan information)   Current Barriers:  [name] Knowledge deficit related to basic heart failure pathophysiology and self care management . Knowledge Deficits related to heart failure medications  Case Manager Clinical Goal(s):  [name] Over the next 90 days, patient will verbalize understanding of Heart Failure Action Plan and when to call doctor . Over the next 30 days, patient will weigh daily and record (notifying MD of 3 lb weight gain over night or 5 lb in a week) . Patient will verbalize two symptoms of CHF exacerbation within the next 30 days.    Interventions:  . Basic overview and discussion of pathophysiology of Heart Failure reviewed  . Provided verbal education on low sodium diet . Reviewed Heart Failure Action Plan in depth and provided written copy . Advised patient to weigh each morning after emptying bladder . Discussed importance of daily weight and advised patient to weigh and record daily . Reviewed role of diuretics in prevention of fluid overload and  management of heart failure  Patient Self Care Activities:  . Takes Heart Failure Medications as prescribed . Weighs daily and record (notifying MD of 3 lb weight gain over night or 5 lb in a week) . Verbalizes understanding of and follows CHF Action Plan . Adheres to low sodium diet  Initial goal documentation RESOLVING DUE TO DUPLICATE GOALS    . [facility]-Track and Manage Fluids and Swelling       Follow Up Date 02/23/2020   - call office if I gain more than 2 pounds in one day or 5 pounds in one week - track weight in diary - use salt in moderation - watch for swelling in feet, ankles and legs every day - weigh myself daily    Why is this important?   It is important to check your weight daily and watch how much salt and liquids you have.  It will help you to manage your heart failure.    Notes: Reports current weight ranges from 189-190 lbs denies any ongoing swelling.    . [facility]-Track and Manage Symptoms       Follow Up Date 02/23/2020    - begin a heart failure diary - develop a rescue plan - follow rescue plan if symptoms flare-up - know when to call the doctor - track symptoms and what helps feel better or worse    Why is this important?   You will be able to handle your symptoms better if you keep track of them.  Making some simple changes to  your lifestyle will help.  Eating healthy is one thing you can do to take good care of yourself.    Notes:        Savannah Cousin, RN Care Management Coordinator [facility] Main Office [phone]

## 2020-01-29 ENCOUNTER — Other Ambulatory Visit: Payer: 59 | Admitting: *Deleted

## 2020-01-29 ENCOUNTER — Other Ambulatory Visit: Payer: Self-pay

## 2020-01-29 ENCOUNTER — Other Ambulatory Visit (HOSPITAL_COMMUNITY)
Admission: RE | Admit: 2020-01-29 | Discharge: 2020-01-29 | Disposition: A | Discharge status: Home / Self Care | Payer: 59 | Source: Ambulatory Visit | Attending: Cardiology | Admitting: Cardiology

## 2020-01-29 DIAGNOSIS — Z01812 Encounter for preprocedural laboratory examination: Secondary | ICD-10-CM | POA: Insufficient documentation

## 2020-01-29 DIAGNOSIS — I493 Ventricular premature depolarization: Secondary | ICD-10-CM

## 2020-01-29 DIAGNOSIS — Z20822 Contact with and (suspected) exposure to covid-19: Secondary | ICD-10-CM | POA: Insufficient documentation

## 2020-01-29 LAB — BASIC METABOLIC PANEL
BUN/Creatinine Ratio: 18 (ref 9–23)
BUN: 17 mg/dL (ref 6–24)
CO2: 22 mmol/L (ref 20–29)
Calcium: 9.5 mg/dL (ref 8.7–10.2)
Chloride: 107 mmol/L — ABNORMAL HIGH (ref 96–106)
Creatinine, Ser: 0.93 mg/dL (ref 0.57–1.00)
GFR calc Af Amer: 82 mL/min/{1.73_m2} (ref >59)
GFR calc non Af Amer: 71 mL/min/{1.73_m2} (ref >59)
Glucose: 127 mg/dL — ABNORMAL HIGH (ref 65–99)
Potassium: 4.3 mmol/L (ref 3.5–5.2)
Sodium: 138 mmol/L (ref 134–144)

## 2020-01-29 LAB — CBC WITH DIFFERENTIAL/PLATELET
Basophils Absolute: 0 10*3/uL (ref 0.0–0.2)
Basos: 0 %
EOS (ABSOLUTE): 0 10*3/uL (ref 0.0–0.4)
Eos: 1 %
Hematocrit: 36.2 % (ref 34.0–46.6)
Hemoglobin: 12.1 g/dL (ref 11.1–15.9)
Lymphocytes Absolute: 1.6 10*3/uL (ref 0.7–3.1)
Lymphs: 35 %
MCH: 26.2 pg — ABNORMAL LOW (ref 26.6–33.0)
MCHC: 33.4 g/dL (ref 31.5–35.7)
MCV: 79 fL (ref 79–97)
Monocytes Absolute: 0.4 10*3/uL (ref 0.1–0.9)
Monocytes: 9 %
Neutrophils Absolute: 2.6 10*3/uL (ref 1.4–7.0)
Neutrophils: 55 %
Platelets: 203 10*3/uL (ref 150–450)
RBC: 4.61 x10E6/uL (ref 3.77–5.28)
RDW: 14.3 % (ref 11.7–15.4)
WBC: 4.7 10*3/uL (ref 3.4–10.8)

## 2020-01-29 LAB — SARS CORONAVIRUS 2 (TAT 6-24 HRS): SARS Coronavirus 2: NEGATIVE

## 2020-01-30 ENCOUNTER — Encounter (HOSPITAL_COMMUNITY): Payer: Self-pay | Admitting: Cardiology

## 2020-01-30 NOTE — Anesthesia Preprocedure Evaluation (Addendum)
Anesthesia Evaluation  Patient identified by MRN, date of birth, ID band Patient awake    Reviewed: Allergy & Precautions, NPO status , Patient's Chart, lab work & pertinent test results  Airway Mallampati: II  TM Distance: >3 FB Neck ROM: Full    Dental no notable dental hx.    Pulmonary neg pulmonary ROS,    Pulmonary exam normal breath sounds clear to auscultation       Cardiovascular Exercise Tolerance: Good hypertension, +CHF  Normal cardiovascular exam Rhythm:Regular Rate:Normal  ST with PVC's   Neuro/Psych negative neurological ROS  negative psych ROS   GI/Hepatic negative GI ROS, Neg liver ROS,   Endo/Other  diabetes  Renal/GU Renal disease (kidney stones)  negative genitourinary   Musculoskeletal negative musculoskeletal ROS (+)   Abdominal Normal abdominal exam  (+)   Peds negative pediatric ROS (+)  Hematology negative hematology ROS (+)   Anesthesia Other Findings   Reproductive/Obstetrics negative OB ROS                            Anesthesia Physical Anesthesia Plan  ASA: III  Anesthesia Plan: MAC   Post-op Pain Management:    Induction:   PONV Risk Score and Plan: 3  Airway Management Planned: Natural Airway and Simple Face Mask  Additional Equipment:   Intra-op Plan:   Post-operative Plan: Extubation in OR  Informed Consent: I have reviewed the patients History and Physical, chart, labs and discussed the procedure including the risks, benefits and alternatives for the proposed anesthesia with the patient or authorized representative who has indicated his/her understanding and acceptance.     Dental advisory given  Plan Discussed with: Anesthesiologist and CRNA  Anesthesia Plan Comments:       Anesthesia Quick Evaluation

## 2020-01-30 NOTE — Progress Notes (Signed)
Instructed patient on the following items: Arrival time 0630 Nothing to eat or drink after midnight No meds AM of procedure Responsible person to drive you home and stay with you for 24 hours

## 2020-01-31 ENCOUNTER — Ambulatory Visit (HOSPITAL_COMMUNITY)
Admission: RE | Admit: 2020-01-31 | Discharge: 2020-01-31 | Disposition: A | Discharge status: Home / Self Care | Payer: 59 | Attending: Cardiology | Admitting: Cardiology

## 2020-01-31 ENCOUNTER — Other Ambulatory Visit: Payer: Self-pay

## 2020-01-31 ENCOUNTER — Encounter (HOSPITAL_COMMUNITY): Payer: Self-pay | Admitting: Cardiology

## 2020-01-31 ENCOUNTER — Ambulatory Visit (HOSPITAL_COMMUNITY): Payer: 59 | Admitting: Anesthesiology

## 2020-01-31 ENCOUNTER — Encounter (HOSPITAL_COMMUNITY)
Admission: RE | Disposition: A | Discharge status: Home / Self Care | Payer: 59 | Source: Home / Self Care | Attending: Cardiology

## 2020-01-31 DIAGNOSIS — E119 Type 2 diabetes mellitus without complications: Secondary | ICD-10-CM | POA: Diagnosis not present

## 2020-01-31 DIAGNOSIS — I493 Ventricular premature depolarization: Secondary | ICD-10-CM

## 2020-01-31 DIAGNOSIS — I5022 Chronic systolic (congestive) heart failure: Secondary | ICD-10-CM | POA: Diagnosis not present

## 2020-01-31 DIAGNOSIS — Z8619 Personal history of other infectious and parasitic diseases: Secondary | ICD-10-CM | POA: Insufficient documentation

## 2020-01-31 DIAGNOSIS — Z8249 Family history of ischemic heart disease and other diseases of the circulatory system: Secondary | ICD-10-CM | POA: Insufficient documentation

## 2020-01-31 DIAGNOSIS — I428 Other cardiomyopathies: Secondary | ICD-10-CM | POA: Insufficient documentation

## 2020-01-31 DIAGNOSIS — Z833 Family history of diabetes mellitus: Secondary | ICD-10-CM | POA: Diagnosis not present

## 2020-01-31 DIAGNOSIS — I110 Hypertensive heart disease with heart failure: Secondary | ICD-10-CM | POA: Insufficient documentation

## 2020-01-31 HISTORY — PX: PVC ABLATION: EP1236

## 2020-01-31 LAB — PREGNANCY, URINE: Preg Test, Ur: NEGATIVE

## 2020-01-31 LAB — POCT ACTIVATED CLOTTING TIME
Activated Clotting Time: 241 seconds
Activated Clotting Time: 252 seconds

## 2020-01-31 LAB — GLUCOSE, CAPILLARY
Glucose-Capillary: 133 mg/dL — ABNORMAL HIGH (ref 70–99)
Glucose-Capillary: 109 mg/dL — ABNORMAL HIGH (ref 70–99)

## 2020-01-31 SURGERY — PVC ABLATION
Anesthesia: General

## 2020-01-31 MED ORDER — HEPARIN SODIUM (PORCINE) 1000 UNIT/ML IJ SOLN
INTRAMUSCULAR | Status: DC | PRN
  Administered 2020-01-31: 1000 [IU] via INTRAVENOUS

## 2020-01-31 MED ORDER — ONDANSETRON HCL 4 MG/2ML IJ SOLN: 4.0000 mg | Freq: Four times a day (QID) | INTRAMUSCULAR | Status: DC | PRN

## 2020-01-31 MED ORDER — BUPIVACAINE HCL (PF) 0.25 % IJ SOLN
INTRAMUSCULAR | Status: DC | PRN
  Administered 2020-01-31 (×2): 30 mL

## 2020-01-31 MED ORDER — FENTANYL CITRATE (PF) 100 MCG/2ML IJ SOLN: 25.0000 ug | INTRAMUSCULAR | Status: DC | PRN

## 2020-01-31 MED ORDER — SODIUM CHLORIDE 0.9% FLUSH: 3.0000 mL | Freq: Two times a day (BID) | INTRAVENOUS | Status: DC

## 2020-01-31 MED ORDER — HEPARIN (PORCINE) IN NACL 1000-0.9 UT/500ML-% IV SOLN
INTRAVENOUS | Status: DC | PRN
  Administered 2020-01-31 (×3): 500 mL

## 2020-01-31 MED ORDER — FENTANYL CITRATE (PF) 100 MCG/2ML IJ SOLN
INTRAMUSCULAR | Status: DC | PRN
Start: 2020-01-31 — End: 2020-01-31
  Administered 2020-01-31 (×4): 25 ug via INTRAVENOUS

## 2020-01-31 MED ORDER — MIDAZOLAM HCL 2 MG/2ML IJ SOLN
INTRAMUSCULAR | Status: DC | PRN
  Administered 2020-01-31 (×2): 1 mg via INTRAVENOUS

## 2020-01-31 MED ORDER — HEPARIN (PORCINE) IN NACL 1000-0.9 UT/500ML-% IV SOLN
INTRAVENOUS | Status: AC
  Filled 2020-01-31: qty 500

## 2020-01-31 MED ORDER — PROTAMINE SULFATE 10 MG/ML IV SOLN
INTRAVENOUS | Status: DC | PRN
  Administered 2020-01-31: 40 mg via INTRAVENOUS

## 2020-01-31 MED ORDER — HEPARIN SODIUM (PORCINE) 1000 UNIT/ML IJ SOLN
INTRAMUSCULAR | Status: AC
  Filled 2020-01-31: qty 1

## 2020-01-31 MED ORDER — PROMETHAZINE HCL 25 MG/ML IJ SOLN: 6.2500 mg | INTRAMUSCULAR | Status: DC | PRN

## 2020-01-31 MED ORDER — HEPARIN SODIUM (PORCINE) 1000 UNIT/ML IJ SOLN
INTRAMUSCULAR | Status: DC | PRN
  Administered 2020-01-31: 3000 [IU] via INTRAVENOUS
  Administered 2020-01-31: 2000 [IU] via INTRAVENOUS
  Administered 2020-01-31: 12000 [IU] via INTRAVENOUS

## 2020-01-31 MED ORDER — SODIUM CHLORIDE 0.9 % IV SOLN: INTRAVENOUS | Status: DC

## 2020-01-31 MED ORDER — ISOPROTERENOL HCL 0.2 MG/ML IJ SOLN
INTRAVENOUS | Status: DC | PRN
  Administered 2020-01-31: 2 ug/min via INTRAVENOUS

## 2020-01-31 MED ORDER — ACETAMINOPHEN 325 MG PO TABS: 650.0000 mg | ORAL_TABLET | ORAL | Status: DC | PRN

## 2020-01-31 MED ORDER — SODIUM CHLORIDE 0.9 % IV SOLN: 250.0000 mL | INTRAVENOUS | Status: DC | PRN

## 2020-01-31 MED ORDER — ISOPROTERENOL HCL 0.2 MG/ML IJ SOLN
INTRAMUSCULAR | Status: AC
  Filled 2020-01-31: qty 5

## 2020-01-31 MED ORDER — BUPIVACAINE HCL (PF) 0.25 % IJ SOLN
INTRAMUSCULAR | Status: AC
  Filled 2020-01-31: qty 30

## 2020-01-31 MED ORDER — SODIUM CHLORIDE 0.9% FLUSH: 3.0000 mL | INTRAVENOUS | Status: DC | PRN

## 2020-01-31 MED ORDER — PROPOFOL 500 MG/50ML IV EMUL
INTRAVENOUS | Status: DC | PRN
  Administered 2020-01-31: 15 ug/kg/min via INTRAVENOUS

## 2020-01-31 SURGICAL SUPPLY — 17 items
CATH 8FR REPROCESSED SOUNDSTAR (CATHETERS) ×2 IMPLANT
CATH JOSEPH QUAD ALLRED 6F REP (CATHETERS) ×2 IMPLANT
CATH MAPPNG PENTARAY F 2-6-2MM (CATHETERS) ×1 IMPLANT
CATH SMTCH THERMOCOOL SF DF (CATHETERS) ×2 IMPLANT
CLOSURE PERCLOSE PROSTYLE (VASCULAR PRODUCTS) ×8 IMPLANT
KIT MICROPUNCTURE NIT STIFF (SHEATH) ×4 IMPLANT
PACK EP LATEX FREE (CUSTOM PROCEDURE TRAY) ×2
PACK EP LF (CUSTOM PROCEDURE TRAY) ×1 IMPLANT
PAD PRO RADIOLUCENT 2001M-C (PAD) ×2 IMPLANT
PATCH CARTO3 (PAD) ×2 IMPLANT
PENTARAY F 2-6-2MM (CATHETERS) ×2
SHEATH PINNACLE 6F 10CM (SHEATH) ×2 IMPLANT
SHEATH PINNACLE 8F 10CM (SHEATH) ×4 IMPLANT
SHEATH PINNACLE 9F 10CM (SHEATH) ×2 IMPLANT
SHEATH SET SUPER ARROWFLEX 8FR (SHEATH) ×4 IMPLANT
TUBING SMART ABLATE COOLFLOW (TUBING) ×2 IMPLANT
WIRE HI TORQ VERSACORE-J 145CM (WIRE) ×2 IMPLANT

## 2020-01-31 NOTE — Progress Notes (Signed)
Up and walked and tolerated well; bilat groins stable, no bleeding or hematoma 

## 2020-01-31 NOTE — Anesthesia Postprocedure Evaluation (Signed)
Anesthesia Post Note  Patient: Savannah Morales  Procedure(s) Performed: PVC ABLATION (N/A )     Patient location during evaluation: PACU Anesthesia Type: MAC Level of consciousness: awake and alert Pain management: pain level controlled Vital Signs Assessment: post-procedure vital signs reviewed and stable Respiratory status: spontaneous breathing and respiratory function stable Cardiovascular status: stable Postop Assessment: no apparent nausea or vomiting Anesthetic complications: no   No complications documented.  Last Vitals:  Vitals:   01/31/20 1500 01/31/20 1600  BP: 111/70 94/67  Pulse: (!) 107 (!) 102  Resp: (!) 22 19  SpO2: 98% 96%    Last Pain:  Vitals:   01/31/20 1225  TempSrc:   PainSc: 0-No pain                 Merlinda Frederick

## 2020-01-31 NOTE — Transfer of Care (Signed)
Immediate Anesthesia Transfer of Care Note  Patient: Savannah Morales  Procedure(s) Performed: PVC ABLATION (N/A )  Patient Location: Cath Lab  Anesthesia Type:MAC  Level of Consciousness: awake, alert  and oriented  Airway & Oxygen Therapy: Patient Spontanous Breathing  Post-op Assessment: Report given to RN  Post vital signs: Reviewed and stable  Last Vitals:  Vitals Value Taken Time  BP 107/88 01/31/20 1144  Temp    Pulse    Resp 19 01/31/20 1144  SpO2    Vitals shown include unvalidated device data.  Last Pain:  Vitals:   01/31/20 0654  TempSrc:   PainSc: 0-No pain         Complications: No complications documented.

## 2020-01-31 NOTE — Discharge Instructions (Signed)
Cardiac Ablation, Care After  This sheet gives you information about how to care for yourself after your procedure. Your health care provider may also give you more specific instructions. If you have problems or questions, contact your health care provider. What can I expect after the procedure? After the procedure, it is common to have:  Bruising around your puncture site.  Tenderness around your puncture site.  Skipped heartbeats.  Tiredness (fatigue).  Follow these instructions at home: Puncture site care   Follow instructions from your health care provider about how to take care of your puncture site. Make sure you: ? If present, leave stitches (sutures), skin glue, or adhesive strips in place. These skin closures may need to stay in place for up to 2 weeks. If adhesive strip edges start to loosen and curl up, you may trim the loose edges. Do not remove adhesive strips completely unless your health care provider tells you to do that. ? Remove dressing in 24 hours. Then you may shower.  Check your puncture site every day for signs of infection. Check for: ? Redness, swelling, or pain. ? Fluid or blood. If your puncture site starts to bleed, lie down on your back, apply firm pressure to the area, and contact your health care provider. ? Warmth. ? Pus or a bad smell. Driving  Do not drive for at least 4 days after your procedure or however long your health care provider recommends. (Do not resume driving if you have previously been instructed not to drive for other health reasons.)  Do not drive or use heavy machinery while taking prescription pain medicine. Activity  Avoid activities that take a lot of effort for at least 7 days after your procedure.  Do not lift anything that is heavier than 5 lb (4.5 kg) for one week.   No sexual activity for 1 week.   Return to your normal activities as told by your health care provider. Ask your health care provider what activities are  safe for you. General instructions  Take over-the-counter and prescription medicines only as told by your health care provider.  Do not use any products that contain nicotine or tobacco, such as cigarettes and e-cigarettes. If you need help quitting, ask your health care provider.  You may shower after 24 hours, but Do not take baths, swim, or use a hot tub for 1 week.   Do not drink alcohol for 24 hours after your procedure.  Keep all follow-up visits as told by your health care provider. This is important. Contact a health care provider if:  You have redness, mild swelling, or pain around your puncture site.  You have fluid or blood coming from your puncture site that stops after applying firm pressure to the area.  Your puncture site feels warm to the touch.  You have pus or a bad smell coming from your puncture site.  You have a fever.  You have chest pain or discomfort that spreads to your neck, jaw, or arm.  You are sweating a lot.  You feel nauseous.  You have a fast or irregular heartbeat.  You have shortness of breath.  You are dizzy or light-headed and feel the need to lie down.  You have pain or numbness in the arm or leg closest to your puncture site. Get help right away if:  Your puncture site suddenly swells.  Your puncture site is bleeding and the bleeding does not stop after applying firm pressure to the area. These   symptoms may represent a serious problem that is an emergency. Do not wait to see if the symptoms will go away. Get medical help right away. Call your local emergency services (911 in the [place] Do not drive yourself to the hospital. Summary  After the procedure, it is normal to have bruising and tenderness at the puncture site in your groin, neck, or forearm.  Check your puncture site every day for signs of infection.  Get help right away if your puncture site is bleeding and the bleeding does not stop after applying firm pressure to the  area. This is a medical emergency. This information is not intended to replace advice given to you by your health care provider. Make sure you discuss any questions you have with your health care provider.    

## 2020-01-31 NOTE — Interval H&P Note (Signed)
History and Physical Interval Note:  01/31/2020 7:56 AM  Savannah Morales  has presented today for surgery, with the diagnosis of PVC'c.  The various methods of treatment have been discussed with the patient and family. After consideration of risks, benefits and other options for treatment, the patient has consented to  Procedure(s): PVC ABLATION (N/A) as a surgical intervention.  The patient's history has been reviewed, patient examined, no change in status, stable for surgery.  I have reviewed the patient's chart and labs.  Questions were answered to the patient's satisfaction.     Yaire Kreher T Lynne Righi

## 2020-01-31 NOTE — Anesthesia Procedure Notes (Signed)
Procedure Name: MAC Date/Time: 01/31/2020 8:47 AM Performed by: Barrington Ellison, CRNA Pre-anesthesia Checklist: Patient identified, Emergency Drugs available, Suction available, Patient being monitored and Timeout performed Patient Re-evaluated:Patient Re-evaluated prior to induction Oxygen Delivery Method: Simple face mask

## 2020-02-01 ENCOUNTER — Encounter (HOSPITAL_COMMUNITY): Payer: Self-pay | Admitting: Cardiology

## 2020-02-01 ENCOUNTER — Telehealth: Payer: Self-pay

## 2020-02-01 NOTE — Telephone Encounter (Signed)
Per Dr. Simeon Craft Pt f/u with him in 1-2 weeks.  Sent to scheduling.

## 2020-02-02 ENCOUNTER — Telehealth (HOSPITAL_COMMUNITY): Payer: Self-pay | Admitting: Vascular Surgery

## 2020-02-02 NOTE — Telephone Encounter (Signed)
Called pt to give new pt appt, pt VM is full will send new pt appt information in mail

## 2020-02-12 ENCOUNTER — Ambulatory Visit (INDEPENDENT_AMBULATORY_CARE_PROVIDER_SITE_OTHER): Payer: 59 | Admitting: Cardiology

## 2020-02-12 ENCOUNTER — Encounter: Payer: Self-pay | Admitting: Cardiology

## 2020-02-12 ENCOUNTER — Encounter: Payer: Self-pay | Admitting: *Deleted

## 2020-02-12 ENCOUNTER — Ambulatory Visit: Payer: Self-pay | Admitting: *Deleted

## 2020-02-12 ENCOUNTER — Other Ambulatory Visit: Payer: Self-pay

## 2020-02-12 VITALS — BP 112/72 | HR 94 | Ht 68.0 in | Wt 183.2 lb

## 2020-02-12 DIAGNOSIS — I509 Heart failure, unspecified: Secondary | ICD-10-CM | POA: Diagnosis not present

## 2020-02-12 DIAGNOSIS — I493 Ventricular premature depolarization: Secondary | ICD-10-CM | POA: Insufficient documentation

## 2020-02-12 NOTE — Progress Notes (Signed)
Electrophysiology Office Follow up Visit Note:    Date:  02/12/2020   ID:  Savannah Morales, DOB 04/18/1966, MRN [id number]  PCP:  Ardith Dark, MD  [facility] Cardiologist:  Reatha Harps, MD  [facility] Electrophysiologist:  Lanier Prude, MD    Interval History:    Savannah Morales is a 54 y.o. female who presents for a follow up visit. They were last seen in clinic January 09, 2020. Since their last appointment, she underwent PVC ablation on January 31, 2020.  Her PVCs were very frequent and localized to the left right commissure.  Extensive ablation was performed in the LVOT, RVOT, aortic cusps with transient suppression in the PVC.  She is relatively asymptomatic from her PVCs.  She tells me that she only experiences some intermittent shortness of breath which seems to be more related to her underlying ventricular dysfunction rather than the PVCs themselves.     Past Medical History:  Diagnosis Date  . Diabetes mellitus without complication (HCC)    Type II  . History of chicken pox   . Hypertension   . Kidney stones     Past Surgical History:  Procedure Laterality Date  . ANKLE SURGERY    . PVC ABLATION N/A 01/31/2020   Procedure: PVC ABLATION;  Surgeon: Lanier Prude, MD;  Location: [facility] INVASIVE CV LAB;  Service: Cardiovascular;  Laterality: N/A;    Current Medications: Current Meds  Medication Sig  . benzoyl peroxide-erythromycin (BENZAMYCIN) gel Apply 1 application topically daily.   [name] ELDERBERRY PO Take 2 tablets by mouth daily.   . furosemide (LASIX) 40 MG tablet Take 1 tablet (40 mg total) by mouth every other day.  . Garlic 1000 MG CAPS Take 1,000 mg by mouth daily.   [name] lisinopril (ZESTRIL) 5 MG tablet Take 1 tablet (5 mg total) by mouth daily.  . metFORMIN (GLUCOPHAGE-XR) 500 MG 24 hr tablet TAKE 1 TABLET BY MOUTH DAILY WITH BREAKFAST  . metoprolol succinate (TOPROL-XL) 50 MG 24 hr tablet Take 2 tablets (100 mg total) by mouth daily.  Take with or immediately following a meal.  . Omega-3 Fatty Acids (FISH OIL) 1200 MG CPDR Take 2,400 capsules by mouth daily.   . Potassium Chloride ER 20 MEQ TBCR Take 20 mEq by mouth every other day.     Allergies:   Patient has no known allergies.   Social History   Socioeconomic History  . Marital status: Divorced    Spouse name: Not on file  . Number of children: 3  . Years of education: 61  . Highest education level: Not on file  Occupational History  . Occupation: Actor   Tobacco Use  . Smoking status: Never Smoker  . Smokeless tobacco: Never Used  Vaping Use  . Vaping Use: Never used  Substance and Sexual Activity  . Alcohol use: Not Currently    Comment: Rarely  . Drug use: No  . Sexual activity: Yes  Other Topics Concern  . Not on file  Social History Narrative  . Not on file   Social Determinants of Health   Financial Resource Strain:   . Difficulty of Paying Living Expenses: Not on file  Food Insecurity: No Food Insecurity  . Worried About Programme researcher, broadcasting/film/video in the Last Year: Never true  . Ran Out of Food in the Last Year: Never true  Transportation Needs: No Transportation Needs  . Lack of Transportation (Medical): No  . Lack  of Transportation (Non-Medical): No  Physical Activity:   . Days of Exercise per Week: Not on file  . Minutes of Exercise per Session: Not on file  Stress:   . Feeling of Stress : Not on file  Social Connections:   . Frequency of Communication with Friends and Family: Not on file  . Frequency of Social Gatherings with Friends and Family: Not on file  . Attends Religious Services: Not on file  . Active Member of Clubs or Organizations: Not on file  . Attends Banker Meetings: Not on file  . Marital Status: Not on file     Family History: The patient's family history includes Cancer in her paternal grandmother; Diabetes in her father; Hyperlipidemia in her mother; Hypertension in her  father.  ROS:   Please see the history of present illness.    All other systems reviewed and are negative.  EKGs/Labs/Other Studies Reviewed:    The following studies were reviewed today: Records, previous notes, ablation note  EKG:  The ekg ordered today demonstrates sinus rhythm with monomorphic PVCs (V3 precordial transition, inferior axis)  Recent Labs: 12/23/2019: ALT 49 12/24/2019: B Natriuretic Peptide 607.9 12/25/2019: TSH 0.341 12/27/2019: Magnesium 1.9 01/29/2020: BUN 17; Creatinine, Ser 0.93; Hemoglobin 12.1; Platelets 203; Potassium 4.3; Sodium 138  Recent Lipid Panel    Component Value Date/Time   CHOL 160 12/25/2019 0807   TRIG 60 12/25/2019 0807   HDL 50 12/25/2019 0807   CHOLHDL 3.2 12/25/2019 0807   VLDL 12 12/25/2019 0807   LDLCALC 98 12/25/2019 0807    Physical Exam:    VS:  BP 112/72   Pulse 94   Ht 5\' 8"  (1.727 m)   Wt 183 lb 3.2 oz (83.1 kg)   SpO2 99%   BMI 27.86 kg/m     Wt Readings from Last 3 Encounters:  02/12/20 183 lb 3.2 oz (83.1 kg)  01/31/20 188 lb (85.3 kg)  01/09/20 189 lb (85.7 kg)     GEN:  Well nourished, well developed in no acute distress HEENT: Normal NECK: No JVD; No carotid bruits LYMPHATICS: No lymphadenopathy CARDIAC: RRR, no murmurs, rubs, gallops.  No PVCs during my auscultation RESPIRATORY:  Clear to auscultation without rales, wheezing or rhonchi  ABDOMEN: Soft, non-tender, non-distended MUSCULOSKELETAL:  No edema; No deformity  SKIN: Warm and dry NEUROLOGIC:  Alert and oriented x 3 PSYCHIATRIC:  Normal affect   ASSESSMENT:    1. PVC's (premature ventricular contractions)   2. Congestive heart failure, unspecified HF chronicity, unspecified heart failure type (HCC)    PLAN:    In order of problems listed above:  1. PVCs Recent ablation procedure localized at the origin of the left right commissure.  Extensive ablation performed from the commissure, aortic root, LVOT, and RVOT.  From a discussion with the  patient and EKG today it looks like the burden may be decreased but the PVCs certainly not eliminated.  I discussed options for treating the PVC including pharmacologic suppression versus repeat ablation attempt in the future.  I discussed how it is most likely that we would need both of these strategies in the future but may use a trial of pharmacologic suppression initially.  I discussed amiodarone and flecainide with the patient today including the risks associated with both agents.  Given her young age, would like to avoid amiodarone and its associated cumulative risks.  We will plan to reassess her PVC burden using a 3-day monitor and if still elevated as I  suspect may be, can initiate a trial of flecainide 100 mg by mouth twice daily.  She is already on metoprolol succinate daily which will be continued.  I also discussed the importance of her initiating care in our heart failure clinic.  She has an appointment in the coming weeks with Dr. Gala Romney.  2.  Chronic systolic heart failure Biventricular involvement on recent MRI.  No significant LGE.  Suspect at least part of her cardiomyopathy is due to her frequent PVCs.  Upcoming appointment with Dr. Gala Romney.  Plan to follow-up his recommendations and the results of the 3-day monitor.     Medication Adjustments/Labs and Tests Ordered: Current medicines are reviewed at length with the patient today.  Concerns regarding medicines are outlined above.  Orders Placed This Encounter  Procedures  . LONG TERM MONITOR (3-14 DAYS)  . EKG 12-Lead   No orders of the defined types were placed in this encounter.    Signed, Steffanie Dunn, MD, [facility]  02/12/2020 10:34 AM    Electrophysiology [facility]

## 2020-02-12 NOTE — Patient Instructions (Addendum)
Medication Instructions:  Your physician recommends that you continue on your current medications as directed. Please refer to the Current Medication list given to you today. *If you need a refill on your cardiac medications before your next appointment, please call your pharmacy*  Lab Work: None ordered. If you have labs (blood work) drawn today and your tests are completely normal, you will receive your results only by: [name] MyChart Message (if you have MyChart) OR . A paper copy in the mail If you have any lab test that is abnormal or we need to change your treatment, we will call you to review the results.  Testing/Procedures: Your physician has recommended that you wear a holter monitor. Holter monitors are medical devices that record the heart's electrical activity. Doctors most often use these monitors to diagnose arrhythmias. Arrhythmias are problems with the speed or rhythm of the heartbeat. The monitor is a small, portable device. You can wear one while you do your normal daily activities. This is usually used to diagnose what is causing palpitations/syncope (passing out).  You will wear a 3 day zio monitor  Follow-Up: At [facility], you and your health needs are our priority.  As part of our continuing mission to provide you with exceptional heart care, we have created designated Provider Care Teams.  These Care Teams include your primary Cardiologist (physician) and Advanced Practice Providers (APPs -  Physician Assistants and Nurse Practitioners) who all work together to provide you with the care you need, when you need it.  Your next appointment:   Your physician wants you to follow-up in: 8 weeks with Dr. Lalla Brothers.    April 04, 2020 at 10:45 am at the [facility] office  ZIO XT- Long Term Monitor Instructions   Your physician has requested you wear your ZIO patch monitor__3__days.   This is a single patch monitor.  [employer] supplies one patch monitor per enrollment.   Additional stickers are not available.   Please do not apply patch if you will be having a Nuclear Stress Test, Echocardiogram, Cardiac CT, MRI, or Chest Xray during the time frame you would be wearing the monitor. The patch cannot be worn during these tests.  You cannot remove and re-apply the ZIO XT patch monitor.   Your ZIO patch monitor will be sent USPS Priority mail from [facility] directly to your home address. The monitor may also be mailed to a PO BOX if home delivery is not available.   It may take 3-5 days to receive your monitor after you have been enrolled.   Once you have received you monitor, please review enclosed instructions.  Your monitor has already been registered assigning a specific monitor serial # to you.   Applying the monitor   Shave hair from upper left chest.   Hold abrader disc by orange tab.  Rub abrader in 40 strokes over left upper chest as indicated in your monitor instructions.   Clean area with 4 enclosed alcohol pads .  Use all pads to assure are is cleaned thoroughly.  Let dry.   Apply patch as indicated in monitor instructions.  Patch will be place under collarbone on left side of chest with arrow pointing upward.   Rub patch adhesive wings for 2 minutes.Remove white label marked "1".  Remove white label marked "2".  Rub patch adhesive wings for 2 additional minutes.   While looking in a mirror, press and release button in center of patch.  A small green light will  flash 3-4 times .  This will be your only indicator the monitor has been turned on.     Do not shower for the first 24 hours.  You may shower after the first 24 hours.   Press button if you feel a symptom. You will hear a small click.  Record Date, Time and Symptom in the Patient Log Book.   When you are ready to remove patch, follow instructions on last 2 pages of Patient Log Book.  Stick patch monitor onto last page of Patient Log Book.   Place Patient Log Book in [place] box.   Use locking tab on box and tape box closed securely.  The [employer] and [employer] has [employer] on it.  Please place in mailbox as soon as possible.  Your physician should have your test results approximately 7 days after the monitor has been mailed back to [facility].   Call [facility] Customer Care at [phone] if you have questions regarding your ZIO XT patch monitor.  Call them immediately if you see an orange light blinking on your monitor.   If your monitor falls off in less than 4 days contact our Monitor department at [phone].  If your monitor becomes loose or falls off after 4 days call [employer] at [phone] for suggestions on securing your monitor.

## 2020-02-12 NOTE — Progress Notes (Signed)
Patient ID: Savannah Morales, female   DOB: 1966-04-04, 54 y.o.   MRN: [id number] Patient enrolled for [employer] to ship a 3 day ZIO XT long term holter monitor to her home.

## 2020-02-16 ENCOUNTER — Ambulatory Visit (INDEPENDENT_AMBULATORY_CARE_PROVIDER_SITE_OTHER): Payer: 59

## 2020-02-16 DIAGNOSIS — I493 Ventricular premature depolarization: Secondary | ICD-10-CM | POA: Diagnosis not present

## 2020-02-21 NOTE — H&P (View-Only) (Signed)
ADVANCED HF CLINIC CONSULT NOTE  Patient ID: KLOE OATES MRN: [id number]; DOB: 1965-09-15    Primary Care Provider: Ardith Dark, MD [facility] Cardiologist: Reatha Harps, MD [facility] Electrophysiologist:  Lanier Prude, MD    Patient Profile:   Savannah Morales is a 54 y.o. female with a hx of DM, HTN who is being seen today for the evaluation of new onset systolic HF at the request of Dr. Val Eagle' Jennette Kettle  Says she was doing fine until she got the COVID vaccine [employer]) in May. [name] was admitted 12/24/19 with worsening SOB and cough. Echo showed EF 35%. Diuresed and GDMT started. No coronary calcium and no CP so cath not felt warranted,   cMRI 9/21 - Severely dilated LV EF 21% with normal wall thickness. Diffuse - RVEF 26%. - Severe left atrial enlargement, moderate right atrial enlargement. - Moderate to severe central mitral regurgitation with regurgitant fraction 48%. 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 Lalla Brothers   She is here with daughter. Says she can walk on flat ground without a problem. If exerts too much or goes up a hill gets SOB. Takes lasix every other day and often notes she gets more SOB with cougha nd orthopnea on in between days. Mild LE edema    Review of Systems: [y] = yes, [ ]  = no    General: Weight gain [] ; Weight loss [ ] ; Anorexia [ ] ; Fatigue [y]; Fever [ ] ; Chills [ ] ; Weakness [ ]    Cardiac: Chest pain/pressure [ ] ; Resting SOB [ y]; Exertional SOB [y]; Orthopnea ]; Pedal Edema [y]; Palpitations [ ] ; Syncope [ ] ; Presyncope [ ] ; Paroxysmal nocturnal dyspnea[ ]    Pulmonary: Cough [ ] ; Wheezing[ ] ; Hemoptysis[ ] ; Sputum [ ] ; Snoring [ ]    GI: Vomiting[ ] ; Dysphagia[ ] ; Melena[ ] ; Hematochezia [ ] ; Heartburn[ ] ; Abdominal pain [ ] ; Constipation [ ] ; Diarrhea [ ] ; BRBPR [ ]    GU: Hematuria[ ] ; Dysuria [ ] ; Nocturia[ ]   Vascular: Pain in legs with walking [ ] ;  Pain in feet with lying flat [ ] ; Non-healing sores [ ] ; Stroke [ ] ; TIA [ ] ; Slurred speech [ ] ;   Neuro: Headaches[ ] ; Vertigo[ ] ; Seizures[ ] ; Paresthesias[ ] ;Blurred vision [ ] ; Diplopia [ ] ; Vision changes [ ]    Ortho/Skin: Arthritis [y]; Joint pain [y]; Muscle pain [ ] ; Joint swelling [ ] ; Back Pain [ ] ; Rash [ ]    Psych: Depression[ ] ; Anxiety[ ]    Heme: Bleeding problems [ ] ; Clotting disorders [ ] ; Anemia [ ]    Endocrine: Diabetes ]; Thyroid dysfunction[ ]    Past Medical History:  Diagnosis Date  . Diabetes mellitus without complication (HCC)    Type II  . History of chicken pox   . Hypertension   . Kidney stones     Past Surgical History:  Procedure Laterality Date  . ANKLE SURGERY    . PVC ABLATION N/A 01/31/2020   Procedure: PVC ABLATION;  Surgeon: , MD;  Location: [facility] INVASIVE CV LAB;  Service: Cardiovascular;  Laterality: N/A;    Current Outpatient Medications  Medication Instructions  . benzoyl peroxide-erythromycin (BENZAMYCIN) gel 1 application, Topical, Daily  . ELDERBERRY PO 2 tablets, Oral, Daily  . furosemide (LASIX) 40 mg, Oral, Every other day  . Garlic 1,000 mg, Oral, Daily  . lisinopril (ZESTRIL) 5 mg, Oral, Daily  . metFORMIN (  GLUCOPHAGE-XR) 500 MG 24 hr tablet TAKE 1 TABLET BY MOUTH DAILY WITH BREAKFAST  . metoprolol succinate (TOPROL-XL) 100 mg, Oral, Daily, Take with or immediately following a meal.  . Omega-3 Fatty Acids (FISH OIL) 1200 MG CPDR 2,400 capsules, Oral, Daily  . Potassium Chloride ER 20 MEQ TBCR 20 mEq, Oral, Every other day    Allergies:   No Known Allergies  Social History:   Social History   Socioeconomic History  . Marital status: Divorced    Spouse name: Not on file  . Number of children: 3  . Years of education: 22  . Highest education level: Not on file  Occupational History  . Occupation: Actor   Tobacco Use  . Smoking status: Never Smoker  . Smokeless tobacco:  Never Used  Vaping Use  . Vaping Use: Never used  Substance and Sexual Activity  . Alcohol use: Not Currently    Comment: Rarely  . Drug use: No  . Sexual activity: Yes  Other Topics Concern  . Not on file  Social History Narrative  . Not on file   Social Determinants of Health   Financial Resource Strain:   . Difficulty of Paying Living Expenses: Not on file  Food Insecurity: No Food Insecurity  . Worried About Programme researcher, broadcasting/film/video in the Last Year: Never true  . Ran Out of Food in the Last Year: Never true  Transportation Needs: No Transportation Needs  . Lack of Transportation (Medical): No  . Lack of Transportation (Non-Medical): No  Physical Activity:   . Days of Exercise per Week: Not on file  . Minutes of Exercise per Session: Not on file  Stress:   . Feeling of Stress : Not on file  Social Connections:   . Frequency of Communication with Friends and Family: Not on file  . Frequency of Social Gatherings with Friends and Family: Not on file  . Attends Religious Services: Not on file  . Active Member of Clubs or Organizations: Not on file  . Attends Banker Meetings: Not on file  . Marital Status: Not on file  Intimate Partner Violence:   . Fear of Current or Ex-Partner: Not on file  . Emotionally Abused: Not on file  . Physically Abused: Not on file  . Sexually Abused: Not on file    Family History:   Family History  Problem Relation Age of Onset  . Hyperlipidemia Mother   . Hypertension Father   . Diabetes Father   . Cancer Paternal Grandmother     Family Status  Relation Name Status  . Mother  Alive  . Father  Alive  . PGM  Deceased  . PGF  Deceased     Physical Exam:   Vitals:   02/22/20 1543  BP: 116/74  Pulse: (!) 110  SpO2: 97%  Weight: 84.9 kg (187 lb 2 oz)   No intake or output data in the 24 hours ending 02/21/20 2255 Last 3 Weights 02/12/2020 01/31/2020 01/09/2020  Weight (lbs) 183 lb 3.2 oz 188 lb 189 lb  Weight (kg)  83.099 kg 85.276 kg 85.73 kg     Body mass index is 28.45 kg/m.  General:  Sitting on exam table. No resp difficulty HEENT: normal Neck: supple. JVP 8-9 Carotids 2+ bilat; no bruits. No lymphadenopathy or thryomegaly appreciated. Cor: PMI nondisplaced. Regular tachy + s3 Lungs: clear Abdomen:  soft, nontender, nondistended. No hepatosplenomegaly. No bruits or masses. Good bowel sounds. Extremities: no cyanosis,  clubbing, rash, 1+ edema cool  Neuro: alert & orientedx3, cranial nerves grossly intact. moves all 4 extremities w/o difficulty. Affect pleasant   Relevant CV Studies:  ECHO: 12/24/2019 1. Left ventricular ejection fraction, by estimation, is 30 to 35%. The  left ventricle has moderately decreased function. The left ventricle  demonstrates global hypokinesis. The left ventricular internal cavity size  was moderately dilated. Left ventricular diastolic parameters are consistent with Grade I diastolic dysfunction (impaired relaxation).  2. Right ventricular systolic function is normal. The right ventricular  size is normal. There is normal pulmonary artery systolic pressure.  3. Left atrial size was severely dilated.  4. The mitral valve is normal in structure. Moderate mitral valve  regurgitation. No evidence of mitral stenosis.  5. The aortic valve is normal in structure. Aortic valve regurgitation is  not visualized. No aortic stenosis is present.  6. The inferior vena cava is normal in size with greater than 50%  respiratory variability, suggesting right atrial pressure of 3 mmHg.   Laboratory Data:  High Sensitivity Troponin:   No results for input(s): TROPONINIHS in the last 720 hours.   Chemistry No results for input(s): NA, K, CL, CO2, GLUCOSE, BUN, CREATININE, CALCIUM, GFRNONAA, GFRAA, ANIONGAP in the last 168 hours.  No results for input(s): PROT, ALBUMIN, AST, ALT, ALKPHOS, BILITOT in the last 168 hours. Magnesium  Date Value Ref Range Status  12/27/2019  1.9 1.7 - 2.4 mg/dL Final    Comment:    Performed at [facility], [address], [place], [place] [address]   Hematology No results for input(s): WBC, RBC, HGB, HCT, MCV, MCH, MCHC, RDW, PLT in the last 168 hours. BNP No results for input(s): BNP, PROBNP in the last 168 hours.  TSH  Date Value Ref Range Status  12/25/2019 0.341 (L) 0.350 - 4.500 uIU/mL Final    Comment:    Performed by a 3rd Generation assay with a functional sensitivity of <=0.01 uIU/mL. Performed at [facility], [address] [place], [place] [address]   11/13/2019 1.10 0.35 - 4.50 uIU/mL Final  11/10/2018 0.49 0.35 - 4.50 uIU/mL Final  04/14/2017 0.49 0.35 - 4.50 uIU/mL Final   Free T4  Date Value Ref Range Status  12/25/2019 1.12 0.61 - 1.12 ng/dL Final    Comment:    (NOTE) Biotin ingestion may interfere with free T4 tests. If the results are inconsistent with the TSH level, previous test results, or the clinical presentation, then consider biotin interference. If needed, order repeat testing after stopping biotin. Performed at [facility], [address], [place], [place] [address]    Lab Results  Component Value Date   HGBA1C 6.6 (H) 12/24/2019   Lab Results  Component Value Date   CHOL 160 12/25/2019   HDL 50 12/25/2019   LDLCALC 98 12/25/2019   TRIG 60 12/25/2019   CHOLHDL 3.2 12/25/2019     Assessment and Plan:   1. Acute combined systolic and diastolic CHF: - Diagnosed 8/21 EF 30-35%, - cMRI 9/21 LVEF 21% RV EF 26% mod-sev MR. 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 However is now 3 weeks out from PVC ablation and no significant improvement - I am very concerned about her. 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 Zio patch results pending to requantify PVC burden - Will plan R/L cath to further evaluate early next week. Suspect likelihood of severe CAD is low but at this point I think we need to be 100% sure and also need to know hemodynamic data. - Will see back in clinic q2 weeks for aggressive med titration   2.  Frequent PVCs: -  s/p PVC ablation 01/31/20 with Dr. Lalla Brothers - has f/u Zio patch pending to re-quantify - needs sleep study at some point.   Total time spent 55 minutes. Over half that time spent discussing above.    For questions or updates, please contact [facility] Please consult [email or web address] for contact info under    Signed, Arvilla Meres, MD  02/21/2020 10:55 PM

## 2020-02-21 NOTE — Consult Note (Signed)
ADVANCED HF CLINIC CONSULT NOTE  Patient ID: Savannah Morales MRN: [id number]; DOB: 1965-09-15    Primary Care Provider: Ardith Dark, MD [facility] Cardiologist: Savannah Harps, MD [facility] Electrophysiologist:  Savannah Prude, MD    Patient Profile:   Savannah Morales is a 54 y.o. female with a hx of DM, HTN who is being seen today for the evaluation of new onset systolic HF at the request of Savannah Morales  Says she was doing fine until she got the COVID vaccine [employer]) in May. Savannah Morales was admitted 12/24/19 with worsening SOB and cough. Echo showed EF 35%. Diuresed and GDMT started. No coronary calcium and no CP so cath not felt warranted,   cMRI 9/21 - Severely dilated LV EF 21% with normal wall thickness. Diffuse - RVEF 26%. - Severe left atrial enlargement, moderate right atrial enlargement. - Moderate to severe central mitral regurgitation with regurgitant fraction 48%. 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 Lalla Morales   She is here with daughter. Says she can walk on flat ground without a problem. If exerts too much or goes up a hill gets SOB. Takes lasix every other day and often notes she gets more SOB with cougha nd orthopnea on in between days. Mild LE edema    Review of Systems: [y] = yes, [ ]  = no    General: Weight gain [] ; Weight loss [ ] ; Anorexia [ ] ; Fatigue [y]; Fever [ ] ; Chills [ ] ; Weakness [ ]    Cardiac: Chest pain/pressure [ ] ; Resting SOB [ y]; Exertional SOB [y]; Orthopnea ]; Pedal Edema [y]; Palpitations [ ] ; Syncope [ ] ; Presyncope [ ] ; Paroxysmal nocturnal dyspnea[ ]    Pulmonary: Cough [ ] ; Wheezing[ ] ; Hemoptysis[ ] ; Sputum [ ] ; Snoring [ ]    GI: Vomiting[ ] ; Dysphagia[ ] ; Melena[ ] ; Hematochezia [ ] ; Heartburn[ ] ; Abdominal pain [ ] ; Constipation [ ] ; Diarrhea [ ] ; BRBPR [ ]    GU: Hematuria[ ] ; Dysuria [ ] ; Nocturia[ ]   Vascular: Pain in legs with walking [ ] ;  Pain in feet with lying flat [ ] ; Non-healing sores [ ] ; Stroke [ ] ; TIA [ ] ; Slurred speech [ ] ;   Neuro: Headaches[ ] ; Vertigo[ ] ; Seizures[ ] ; Paresthesias[ ] ;Blurred vision [ ] ; Diplopia [ ] ; Vision changes [ ]    Ortho/Skin: Arthritis [y]; Joint pain [y]; Muscle pain [ ] ; Joint swelling [ ] ; Back Pain [ ] ; Rash [ ]    Psych: Depression[ ] ; Anxiety[ ]    Heme: Bleeding problems [ ] ; Clotting disorders [ ] ; Anemia [ ]    Endocrine: Diabetes ]; Thyroid dysfunction[ ]    Past Medical History:  Diagnosis Date  . Diabetes mellitus without complication (HCC)    Type II  . History of chicken pox   . Hypertension   . Kidney stones     Past Surgical History:  Procedure Laterality Date  . ANKLE SURGERY    . PVC ABLATION N/A 01/31/2020   Procedure: PVC ABLATION;  Surgeon: , MD;  Location: [facility] INVASIVE CV LAB;  Service: Cardiovascular;  Laterality: N/A;    Current Outpatient Medications  Medication Instructions  . benzoyl peroxide-erythromycin (BENZAMYCIN) gel 1 application, Topical, Daily  . ELDERBERRY PO 2 tablets, Oral, Daily  . furosemide (LASIX) 40 mg, Oral, Every other day  . Garlic 1,000 mg, Oral, Daily  . lisinopril (ZESTRIL) 5 mg, Oral, Daily  . metFORMIN (  GLUCOPHAGE-XR) 500 MG 24 hr tablet TAKE 1 TABLET BY MOUTH DAILY WITH BREAKFAST  . metoprolol succinate (TOPROL-XL) 100 mg, Oral, Daily, Take with or immediately following a meal.  . Omega-3 Fatty Acids (FISH OIL) 1200 MG CPDR 2,400 capsules, Oral, Daily  . Potassium Chloride ER 20 MEQ TBCR 20 mEq, Oral, Every other day    Allergies:   No Known Allergies  Social History:   Social History   Socioeconomic History  . Marital status: Divorced    Spouse name: Not on file  . Number of children: 3  . Years of education: 22  . Highest education level: Not on file  Occupational History  . Occupation: Actor   Tobacco Use  . Smoking status: Never Smoker  . Smokeless tobacco:  Never Used  Vaping Use  . Vaping Use: Never used  Substance and Sexual Activity  . Alcohol use: Not Currently    Comment: Rarely  . Drug use: No  . Sexual activity: Yes  Other Topics Concern  . Not on file  Social History Narrative  . Not on file   Social Determinants of Health   Financial Resource Strain:   . Difficulty of Paying Living Expenses: Not on file  Food Insecurity: No Food Insecurity  . Worried About Programme researcher, broadcasting/film/video in the Last Year: Never true  . Ran Out of Food in the Last Year: Never true  Transportation Needs: No Transportation Needs  . Lack of Transportation (Medical): No  . Lack of Transportation (Non-Medical): No  Physical Activity:   . Days of Exercise per Week: Not on file  . Minutes of Exercise per Session: Not on file  Stress:   . Feeling of Stress : Not on file  Social Connections:   . Frequency of Communication with Friends and Family: Not on file  . Frequency of Social Gatherings with Friends and Family: Not on file  . Attends Religious Services: Not on file  . Active Member of Clubs or Organizations: Not on file  . Attends Banker Meetings: Not on file  . Marital Status: Not on file  Intimate Partner Violence:   . Fear of Current or Ex-Partner: Not on file  . Emotionally Abused: Not on file  . Physically Abused: Not on file  . Sexually Abused: Not on file    Family History:   Family History  Problem Relation Age of Onset  . Hyperlipidemia Mother   . Hypertension Father   . Diabetes Father   . Cancer Paternal Grandmother     Family Status  Relation Name Status  . Mother  Alive  . Father  Alive  . PGM  Deceased  . PGF  Deceased     Physical Exam:   Vitals:   02/22/20 1543  BP: 116/74  Pulse: (!) 110  SpO2: 97%  Weight: 84.9 kg (187 lb 2 oz)   No intake or output data in the 24 hours ending 02/21/20 2255 Last 3 Weights 02/12/2020 01/31/2020 01/09/2020  Weight (lbs) 183 lb 3.2 oz 188 lb 189 lb  Weight (kg)  83.099 kg 85.276 kg 85.73 kg     Body mass index is 28.45 kg/m.  General:  Sitting on exam table. No resp difficulty HEENT: normal Neck: supple. JVP 8-9 Carotids 2+ bilat; no bruits. No lymphadenopathy or thryomegaly appreciated. Cor: PMI nondisplaced. Regular tachy + s3 Lungs: clear Abdomen:  soft, nontender, nondistended. No hepatosplenomegaly. No bruits or masses. Good bowel sounds. Extremities: no cyanosis,  clubbing, rash, 1+ edema cool  Neuro: alert & orientedx3, cranial nerves grossly intact. moves all 4 extremities w/o difficulty. Affect pleasant   Relevant CV Studies:  ECHO: 12/24/2019 1. Left ventricular ejection fraction, by estimation, is 30 to 35%. The  left ventricle has moderately decreased function. The left ventricle  demonstrates global hypokinesis. The left ventricular internal cavity size  was moderately dilated. Left ventricular diastolic parameters are consistent with Grade I diastolic dysfunction (impaired relaxation).  2. Right ventricular systolic function is normal. The right ventricular  size is normal. There is normal pulmonary artery systolic pressure.  3. Left atrial size was severely dilated.  4. The mitral valve is normal in structure. Moderate mitral valve  regurgitation. No evidence of mitral stenosis.  5. The aortic valve is normal in structure. Aortic valve regurgitation is  not visualized. No aortic stenosis is present.  6. The inferior vena cava is normal in size with greater than 50%  respiratory variability, suggesting right atrial pressure of 3 mmHg.   Laboratory Data:  High Sensitivity Troponin:   No results for input(s): TROPONINIHS in the last 720 hours.   Chemistry No results for input(s): NA, K, CL, CO2, GLUCOSE, BUN, CREATININE, CALCIUM, GFRNONAA, GFRAA, ANIONGAP in the last 168 hours.  No results for input(s): PROT, ALBUMIN, AST, ALT, ALKPHOS, BILITOT in the last 168 hours. Magnesium  Date Value Ref Range Status  12/27/2019  1.9 1.7 - 2.4 mg/dL Final    Comment:    Performed at [facility], [address], [place], [place] [address]   Hematology No results for input(s): WBC, RBC, HGB, HCT, MCV, MCH, MCHC, RDW, PLT in the last 168 hours. BNP No results for input(s): BNP, PROBNP in the last 168 hours.  TSH  Date Value Ref Range Status  12/25/2019 0.341 (L) 0.350 - 4.500 uIU/mL Final    Comment:    Performed by a 3rd Generation assay with a functional sensitivity of <=0.01 uIU/mL. Performed at [facility], [address] [place], [place] [address]   11/13/2019 1.10 0.35 - 4.50 uIU/mL Final  11/10/2018 0.49 0.35 - 4.50 uIU/mL Final  04/14/2017 0.49 0.35 - 4.50 uIU/mL Final   Free T4  Date Value Ref Range Status  12/25/2019 1.12 0.61 - 1.12 ng/dL Final    Comment:    (NOTE) Biotin ingestion may interfere with free T4 tests. If the results are inconsistent with the TSH level, previous test results, or the clinical presentation, then consider biotin interference. If needed, order repeat testing after stopping biotin. Performed at [facility], [address], [place], [place] [address]    Lab Results  Component Value Date   HGBA1C 6.6 (H) 12/24/2019   Lab Results  Component Value Date   CHOL 160 12/25/2019   HDL 50 12/25/2019   LDLCALC 98 12/25/2019   TRIG 60 12/25/2019   CHOLHDL 3.2 12/25/2019     Assessment and Plan:   1. Acute combined systolic and diastolic CHF: - Diagnosed 8/21 EF 30-35%, - cMRI 9/21 LVEF 21% RV EF 26% mod-sev MR. 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 However is now 3 weeks out from PVC ablation and no significant improvement - I am very concerned about her. 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 Zio patch results pending to requantify PVC burden - Will plan R/L cath to further evaluate early next week. Suspect likelihood of severe CAD is low but at this point I think we need to be 100% sure and also need to know hemodynamic data. - Will see back in clinic q2 weeks for aggressive med titration   2.  Frequent PVCs: -  s/p PVC ablation 01/31/20 with Dr. Lambert - has f/u Zio patch pending to re-quantify - needs sleep study at some point.   Total time spent 55 minutes. Over half that time spent discussing above.    For questions or updates, please contact [facility] Please consult [email or web address] for contact info under    Signed, Gerrell Tabet, MD  02/21/2020 10:55 PM 

## 2020-02-22 ENCOUNTER — Ambulatory Visit (HOSPITAL_COMMUNITY)
Admission: RE | Admit: 2020-02-22 | Discharge: 2020-02-22 | Disposition: A | Discharge status: Home / Self Care | Payer: 59 | Source: Ambulatory Visit | Attending: Internal Medicine | Admitting: Internal Medicine

## 2020-02-22 ENCOUNTER — Other Ambulatory Visit (HOSPITAL_COMMUNITY): Payer: Self-pay

## 2020-02-22 ENCOUNTER — Other Ambulatory Visit: Payer: Self-pay

## 2020-02-22 VITALS — BP 116/74 | HR 110 | Wt 187.1 lb

## 2020-02-22 DIAGNOSIS — Z7901 Long term (current) use of anticoagulants: Secondary | ICD-10-CM | POA: Diagnosis not present

## 2020-02-22 DIAGNOSIS — Z7984 Long term (current) use of oral hypoglycemic drugs: Secondary | ICD-10-CM | POA: Diagnosis not present

## 2020-02-22 DIAGNOSIS — I509 Heart failure, unspecified: Secondary | ICD-10-CM

## 2020-02-22 DIAGNOSIS — Z8249 Family history of ischemic heart disease and other diseases of the circulatory system: Secondary | ICD-10-CM | POA: Diagnosis not present

## 2020-02-22 DIAGNOSIS — R0602 Shortness of breath: Secondary | ICD-10-CM | POA: Diagnosis present

## 2020-02-22 DIAGNOSIS — R000 Tachycardia, unspecified: Secondary | ICD-10-CM | POA: Diagnosis not present

## 2020-02-22 DIAGNOSIS — I493 Ventricular premature depolarization: Secondary | ICD-10-CM

## 2020-02-22 DIAGNOSIS — I110 Hypertensive heart disease with heart failure: Secondary | ICD-10-CM | POA: Insufficient documentation

## 2020-02-22 DIAGNOSIS — I5041 Acute combined systolic (congestive) and diastolic (congestive) heart failure: Secondary | ICD-10-CM | POA: Insufficient documentation

## 2020-02-22 DIAGNOSIS — E119 Type 2 diabetes mellitus without complications: Secondary | ICD-10-CM | POA: Diagnosis not present

## 2020-02-22 DIAGNOSIS — I5023 Acute on chronic systolic (congestive) heart failure: Secondary | ICD-10-CM | POA: Diagnosis not present

## 2020-02-22 LAB — BASIC METABOLIC PANEL
Anion gap: 11 (ref 5–15)
BUN: 18 mg/dL (ref 6–20)
CO2: 22 mmol/L (ref 22–32)
Calcium: 9 mg/dL (ref 8.9–10.3)
Chloride: 106 mmol/L (ref 98–111)
Creatinine, Ser: 0.77 mg/dL (ref 0.44–1.00)
GFR, Estimated: >60 mL/min (ref >60)
Glucose, Bld: 104 mg/dL — ABNORMAL HIGH (ref 70–99)
Potassium: 4.3 mmol/L (ref 3.5–5.1)
Sodium: 139 mmol/L (ref 135–145)

## 2020-02-22 LAB — CBC
HCT: 39.8 % (ref 36.0–46.0)
Hemoglobin: 12.5 g/dL (ref 12.0–15.0)
MCH: 25.3 pg — ABNORMAL LOW (ref 26.0–34.0)
MCHC: 31.4 g/dL (ref 30.0–36.0)
MCV: 80.4 fL (ref 80.0–100.0)
Platelets: 221 10*3/uL (ref 150–400)
RBC: 4.95 MIL/uL (ref 3.87–5.11)
RDW: 14.7 % (ref 11.5–15.5)
WBC: 5.2 10*3/uL (ref 4.0–10.5)
nRBC: 0 % (ref 0.0–0.2)

## 2020-02-22 LAB — BRAIN NATRIURETIC PEPTIDE: B Natriuretic Peptide: 1341.4 pg/mL — ABNORMAL HIGH (ref 0.0–100.0)

## 2020-02-22 MED ORDER — ENTRESTO 24-26 MG PO TABS: 1.0000 | ORAL_TABLET | Freq: Two times a day (BID) | ORAL | 3 refills | Status: DC

## 2020-02-22 MED ORDER — SPIRONOLACTONE 25 MG PO TABS: 12.5000 mg | ORAL_TABLET | Freq: Every day | ORAL | 3 refills | Status: DC

## 2020-02-22 MED ORDER — METOPROLOL SUCCINATE ER 50 MG PO TB24: 50.0000 mg | ORAL_TABLET | Freq: Every day | ORAL | 3 refills | Status: DC

## 2020-02-22 MED ORDER — DIGOXIN 125 MCG PO TABS: 0.1250 mg | ORAL_TABLET | Freq: Every day | ORAL | 3 refills | Status: DC

## 2020-02-22 NOTE — Patient Instructions (Signed)
DECREASE Toprol 50mg  (1 tablet) Daily  START Digoxin 0.125mg  (1 tablet) Daily  START Spironolactone 12.5mg  (1/2 tablet) Daily  START Entresto 24/26mg  (1 tablet) Twice Daily  STOP Lisinopril  Labs done today, your results will be available in MyChart, we will contact you for abnormal readings.  Your physician recommends that you return for pharmacy visits every 2 weeks 3 times.  Your physician recommends that you return for a follow up visit in 2 months  If you have any questions or concerns before your next appointment please send a message through [place] or call our office at [phone].    TO LEAVE A MESSAGE FOR THE NURSE SELECT OPTION 2, PLEASE LEAVE A MESSAGE INCLUDING: . YOUR NAME . DATE OF BIRTH . CALL BACK NUMBER . REASON FOR CALL**this is important as we prioritize the call backs  YOU WILL RECEIVE A CALL BACK THE SAME DAY AS LONG AS YOU CALL BEFORE 4:00 PM      HEART CATHETERIZATION INSTRUCTIONS  You are scheduled for a Cardiac Catheterization on Tuesday, November 2 with Dr. [id number].  1. Please arrive at the [facility] (Main Entrance A) at [facility]: [address] [place], [place] at 12:00 PM (This time is two hours before your procedure to ensure your preparation). Free valet parking service is available.   Special note: Every effort is made to have your procedure done on time. Please understand that emergencies sometimes delay scheduled procedures.  2. Diet: Do not eat solid foods after midnight.  The patient may have clear liquids until 5am upon the day of the procedure.  3. COVID TEST: Saturday 10/30 at 9:30am   [address] [place]  4. Medication instructions in preparation for your procedure:  DO NOT TAKE LASIX, SPIRONOLACTONE 11/2 AM  DO NOT RESTART METFORMIN UNTIL 11/4   On the morning of your procedure, take any morning medicines NOT listed above.  You may use sips of water.  5. Plan for  one night stay--bring personal belongings. 6. Bring a current list of your medications and current insurance cards. 7. You MUST have a responsible person to drive you home. 8. Someone MUST be with you the first 24 hours after you arrive home or your discharge will be delayed. 9. Please wear clothes that are easy to get on and off and wear slip-on shoes.  Thank you for allowing 13/4 to care for you!   -- [facility]

## 2020-02-23 ENCOUNTER — Telehealth (HOSPITAL_COMMUNITY): Payer: Self-pay | Admitting: Pharmacy Technician

## 2020-02-23 ENCOUNTER — Other Ambulatory Visit: Payer: Self-pay | Admitting: *Deleted

## 2020-02-23 NOTE — Patient Outreach (Signed)
[facility]) Care Management  02/23/2020  FAYLINN SCHWENN November 16, 1965 [id number]   Telephone Assessment-Unsuccessful  RN outreach attempt unsuccessful today. RN able to leave a HIPAA approved voice message requesting a call back.   Will follow up over the next week and rescheduled outreach call.  Elliot Cousin, RN Care Management Coordinator [facility] Main Office [phone]

## 2020-02-23 NOTE — Telephone Encounter (Signed)
Patient Advocate Encounter   Received notification from [employer] that prior authorization for Sherryll Burger is required.   PA submitted on CoverMyMeds Key  [id number] Status is pending   Will continue to follow.

## 2020-02-24 ENCOUNTER — Other Ambulatory Visit (HOSPITAL_COMMUNITY)
Admission: RE | Admit: 2020-02-24 | Discharge: 2020-02-24 | Disposition: A | Discharge status: Home / Self Care | Payer: 59 | Source: Ambulatory Visit | Attending: Internal Medicine | Admitting: Internal Medicine

## 2020-02-24 DIAGNOSIS — Z20822 Contact with and (suspected) exposure to covid-19: Secondary | ICD-10-CM | POA: Diagnosis not present

## 2020-02-24 DIAGNOSIS — Z01812 Encounter for preprocedural laboratory examination: Secondary | ICD-10-CM | POA: Insufficient documentation

## 2020-02-25 LAB — SARS CORONAVIRUS 2 (TAT 6-24 HRS): SARS Coronavirus 2: NEGATIVE

## 2020-02-26 NOTE — Telephone Encounter (Signed)
Advanced Heart Failure Patient Advocate Encounter  Prior Authorization for Sherryll Burger has been approved.    PA# [id number] Effective dates: 02/23/20 through 02/22/21  Archer Asa, CPhT

## 2020-02-27 ENCOUNTER — Ambulatory Visit (HOSPITAL_COMMUNITY)
Admission: RE | Admit: 2020-02-27 | Discharge: 2020-02-27 | Disposition: A | Discharge status: Home / Self Care | Payer: 59 | Attending: Internal Medicine | Admitting: Internal Medicine

## 2020-02-27 ENCOUNTER — Encounter (HOSPITAL_COMMUNITY)
Admission: RE | Disposition: A | Discharge status: Home / Self Care | Payer: Self-pay | Source: Home / Self Care | Attending: Internal Medicine

## 2020-02-27 ENCOUNTER — Other Ambulatory Visit: Payer: Self-pay

## 2020-02-27 DIAGNOSIS — I428 Other cardiomyopathies: Secondary | ICD-10-CM | POA: Insufficient documentation

## 2020-02-27 DIAGNOSIS — I5043 Acute on chronic combined systolic (congestive) and diastolic (congestive) heart failure: Secondary | ICD-10-CM | POA: Diagnosis not present

## 2020-02-27 DIAGNOSIS — I5022 Chronic systolic (congestive) heart failure: Secondary | ICD-10-CM

## 2020-02-27 DIAGNOSIS — I110 Hypertensive heart disease with heart failure: Secondary | ICD-10-CM | POA: Insufficient documentation

## 2020-02-27 DIAGNOSIS — I509 Heart failure, unspecified: Secondary | ICD-10-CM

## 2020-02-27 DIAGNOSIS — I493 Ventricular premature depolarization: Secondary | ICD-10-CM | POA: Insufficient documentation

## 2020-02-27 HISTORY — PX: RIGHT/LEFT HEART CATH AND CORONARY ANGIOGRAPHY: CATH118266

## 2020-02-27 LAB — POCT I-STAT EG7
Acid-base deficit: 1 mmol/L (ref 0.0–2.0)
Bicarbonate: 25 mmol/L (ref 20.0–28.0)
Calcium, Ion: 1.24 mmol/L (ref 1.15–1.40)
HCT: 45 % (ref 36.0–46.0)
Hemoglobin: 15.3 g/dL — ABNORMAL HIGH (ref 12.0–15.0)
O2 Saturation: 72 %
Potassium: 3.7 mmol/L (ref 3.5–5.1)
Sodium: 140 mmol/L (ref 135–145)
TCO2: 26 mmol/L (ref 22–32)
pCO2, Ven: 46.1 mmHg (ref 44.0–60.0)
pH, Ven: 7.342 (ref 7.250–7.430)
pO2, Ven: 41 mmHg (ref 32.0–45.0)

## 2020-02-27 LAB — POCT I-STAT 7, (LYTES, BLD GAS, ICA,H+H)
Acid-base deficit: 3 mmol/L — ABNORMAL HIGH (ref 0.0–2.0)
Bicarbonate: 22.4 mmol/L (ref 20.0–28.0)
Calcium, Ion: 1.05 mmol/L — ABNORMAL LOW (ref 1.15–1.40)
HCT: 42 % (ref 36.0–46.0)
Hemoglobin: 14.3 g/dL (ref 12.0–15.0)
O2 Saturation: 97 %
Potassium: 3.3 mmol/L — ABNORMAL LOW (ref 3.5–5.1)
Sodium: 140 mmol/L (ref 135–145)
TCO2: 24 mmol/L (ref 22–32)
pCO2 arterial: 39.9 mmHg (ref 32.0–48.0)
pH, Arterial: 7.357 (ref 7.350–7.450)
pO2, Arterial: 91 mmHg (ref 83.0–108.0)

## 2020-02-27 LAB — GLUCOSE, CAPILLARY
Glucose-Capillary: 78 mg/dL (ref 70–99)
Glucose-Capillary: 66 mg/dL — ABNORMAL LOW (ref 70–99)
Glucose-Capillary: 86 mg/dL (ref 70–99)

## 2020-02-27 SURGERY — RIGHT/LEFT HEART CATH AND CORONARY ANGIOGRAPHY
Anesthesia: LOCAL

## 2020-02-27 MED ORDER — LIDOCAINE HCL (PF) 1 % IJ SOLN
INTRAMUSCULAR | Status: DC | PRN
  Administered 2020-02-27 (×2): 2 mL

## 2020-02-27 MED ORDER — IODIXANOL 320 MG/ML IV SOLN
INTRAVENOUS | Status: DC | PRN
  Administered 2020-02-27: 30 mL via INTRA_ARTERIAL

## 2020-02-27 MED ORDER — HEPARIN (PORCINE) IN NACL 1000-0.9 UT/500ML-% IV SOLN
INTRAVENOUS | Status: AC
  Filled 2020-02-27: qty 1000

## 2020-02-27 MED ORDER — SODIUM CHLORIDE 0.9% FLUSH: 3.0000 mL | Freq: Two times a day (BID) | INTRAVENOUS | Status: DC

## 2020-02-27 MED ORDER — ONDANSETRON HCL 4 MG/2ML IJ SOLN: 4.0000 mg | Freq: Four times a day (QID) | INTRAMUSCULAR | Status: DC | PRN

## 2020-02-27 MED ORDER — MIDAZOLAM HCL 2 MG/2ML IJ SOLN
INTRAMUSCULAR | Status: DC | PRN
  Administered 2020-02-27 (×2): 1 mg via INTRAVENOUS

## 2020-02-27 MED ORDER — HYDRALAZINE HCL 20 MG/ML IJ SOLN: 10.0000 mg | INTRAMUSCULAR | Status: DC | PRN

## 2020-02-27 MED ORDER — ASPIRIN 81 MG PO CHEW
81.0000 mg | CHEWABLE_TABLET | ORAL | Status: AC
  Administered 2020-02-27: 81 mg via ORAL
  Filled 2020-02-27: qty 1

## 2020-02-27 MED ORDER — ACETAMINOPHEN 325 MG PO TABS: 650.0000 mg | ORAL_TABLET | ORAL | Status: DC | PRN

## 2020-02-27 MED ORDER — FENTANYL CITRATE (PF) 100 MCG/2ML IJ SOLN
INTRAMUSCULAR | Status: AC
  Filled 2020-02-27: qty 2

## 2020-02-27 MED ORDER — LIDOCAINE HCL (PF) 1 % IJ SOLN
INTRAMUSCULAR | Status: AC
  Filled 2020-02-27: qty 30

## 2020-02-27 MED ORDER — LABETALOL HCL 5 MG/ML IV SOLN: 10.0000 mg | INTRAVENOUS | Status: DC | PRN

## 2020-02-27 MED ORDER — SODIUM CHLORIDE 0.9 % IV SOLN: INTRAVENOUS | Status: DC

## 2020-02-27 MED ORDER — SODIUM CHLORIDE 0.9 % IV SOLN: 250.0000 mL | INTRAVENOUS | Status: DC | PRN

## 2020-02-27 MED ORDER — ASPIRIN 81 MG PO CHEW: 81.0000 mg | CHEWABLE_TABLET | ORAL | Status: DC

## 2020-02-27 MED ORDER — HEPARIN SODIUM (PORCINE) 1000 UNIT/ML IJ SOLN
INTRAMUSCULAR | Status: DC | PRN
  Administered 2020-02-27: 4000 [IU] via INTRAVENOUS

## 2020-02-27 MED ORDER — SODIUM CHLORIDE 0.9% FLUSH: 3.0000 mL | INTRAVENOUS | Status: DC | PRN

## 2020-02-27 MED ORDER — MIDAZOLAM HCL 2 MG/2ML IJ SOLN
INTRAMUSCULAR | Status: AC
  Filled 2020-02-27: qty 2

## 2020-02-27 MED ORDER — VERAPAMIL HCL 2.5 MG/ML IV SOLN
INTRAVENOUS | Status: DC | PRN
  Administered 2020-02-27: 10 mL via INTRA_ARTERIAL

## 2020-02-27 MED ORDER — VERAPAMIL HCL 2.5 MG/ML IV SOLN
INTRAVENOUS | Status: AC
  Filled 2020-02-27: qty 2

## 2020-02-27 MED ORDER — HEPARIN (PORCINE) IN NACL 1000-0.9 UT/500ML-% IV SOLN
INTRAVENOUS | Status: DC | PRN
  Administered 2020-02-27 (×2): 500 mL

## 2020-02-27 MED ORDER — HEPARIN SODIUM (PORCINE) 1000 UNIT/ML IJ SOLN
INTRAMUSCULAR | Status: AC
  Filled 2020-02-27: qty 1

## 2020-02-27 MED ORDER — FENTANYL CITRATE (PF) 100 MCG/2ML IJ SOLN
INTRAMUSCULAR | Status: DC | PRN
  Administered 2020-02-27 (×2): 25 ug via INTRAVENOUS

## 2020-02-27 SURGICAL SUPPLY — 11 items
CATH 5FR JL3.5 JR4 ANG PIG MP (CATHETERS) ×2 IMPLANT
CATH SWAN GANZ 7F STRAIGHT (CATHETERS) ×2 IMPLANT
GLIDESHEATH SLEND SS 6F .021 (SHEATH) ×2 IMPLANT
GLIDESHEATH SLENDER 7FR .021G (SHEATH) ×2 IMPLANT
GUIDEWIRE .025 260CM (WIRE) ×2 IMPLANT
GUIDEWIRE INQWIRE 1.5J.035X260 (WIRE) ×1 IMPLANT
INQWIRE 1.5J .035X260CM (WIRE) ×2
KIT HEART LEFT (KITS) ×2 IMPLANT
PACK CARDIAC CATHETERIZATION (CUSTOM PROCEDURE TRAY) ×2 IMPLANT
SHEATH PROBE COVER 6X72 (BAG) ×2 IMPLANT
TRANSDUCER W/STOPCOCK (MISCELLANEOUS) ×2 IMPLANT

## 2020-02-27 NOTE — Discharge Instructions (Signed)
Drink plenty of fluids for 48 hours and keep wrist elevated at heart level for 24 hours May resume Metformin in 48 hours. 11/4 pm dose  Radial Site Care   This sheet gives you information about how to care for yourself after your procedure. Your health care provider may also give you more specific instructions. If you have problems or questions, contact your health care provider. What can I expect after the procedure? After the procedure, it is common to have:  Bruising and tenderness at the catheter insertion area. Follow these instructions at home: Medicines  Take over-the-counter and prescription medicines only as told by your health care provider. Insertion site care 1. Follow instructions from your health care provider about how to take care of your insertion site. Make sure you: ? Wash your hands with soap and water before you change your bandage (dressing). If soap and water are not available, use hand sanitizer. ? Remove your dressing as told by your health care provider. In 24 hours 2. Check your insertion site every day for signs of infection. Check for: ? Redness, swelling, or pain. ? Fluid or blood. ? Pus or a bad smell. ? Warmth. 3. Do not take baths, swim, or use a hot tub until your health care provider approves. 4. You may shower 24-48 hours after the procedure, or as directed by your health care provider. ? Remove the dressing and gently wash the site with plain soap and water. ? Pat the area dry with a clean towel. ? Do not rub the site. That could cause bleeding. 5. Do not apply powder or lotion to the site. Activity   1. For 24 hours after the procedure, or as directed by your health care provider: ? Do not flex or bend the affected arm. ? Do not push or pull heavy objects with the affected arm. ? Do not drive yourself home from the hospital or clinic. You may drive 24 hours after the procedure unless your health care provider tells you not to. ? Do not  operate machinery or power tools. 2. Do not lift anything that is heavier than 10 lb (4.5 kg), or the limit that you are told, until your health care provider says that it is safe.  For 4 days 3. Ask your health care provider when it is okay to: ? Return to work or school. ? Resume usual physical activities or sports. ? Resume sexual activity. General instructions  If the catheter site starts to bleed, raise your arm and put firm pressure on the site. If the bleeding does not stop, get help right away. This is a medical emergency.  If you went home on the same day as your procedure, a responsible adult should be with you for the first 24 hours after you arrive home.  Keep all follow-up visits as told by your health care provider. This is important. Contact a health care provider if:  You have a fever.  You have redness, swelling, or yellow drainage around your insertion site. Get help right away if:  You have unusual pain at the radial site.  The catheter insertion area swells very fast.  The insertion area is bleeding, and the bleeding does not stop when you hold steady pressure on the area.  Your arm or hand becomes pale, cool, tingly, or numb. These symptoms may represent a serious problem that is an emergency. Do not wait to see if the symptoms will go away. Get medical help right away. Call  your local emergency services (911 in the [place] Do not drive yourself to the hospital. Summary  After the procedure, it is common to have bruising and tenderness at the site.  Follow instructions from your health care provider about how to take care of your radial site wound. Check the wound every day for signs of infection.  Do not lift anything that is heavier than 10 lb (4.5 kg), or the limit that you are told, until your health care provider says that it is safe. This information is not intended to replace advice given to you by your health care provider. Make sure you discuss any  questions you have with your health care provider. Document Revised: 05/19/2017 Document Reviewed: 05/19/2017 Elsevier Patient Education  2020 [employer].

## 2020-02-27 NOTE — Interval H&P Note (Signed)
History and Physical Interval Note:  02/27/2020 2:20 PM  Savannah Morales  has presented today for surgery, with the diagnosis of heart failure.  The various methods of treatment have been discussed with the patient and family. After consideration of risks, benefits and other options for treatment, the patient has consented to  Procedure(s): RIGHT/LEFT HEART CATH AND CORONARY ANGIOGRAPHY (N/A) and possible coronary angioplasty as a surgical intervention.  The patient's history has been reviewed, patient examined, no change in status, stable for surgery.  I have reviewed the patient's chart and labs.  Questions were answered to the patient's satisfaction.     Terron Merfeld

## 2020-02-28 ENCOUNTER — Encounter (HOSPITAL_COMMUNITY): Payer: Self-pay | Admitting: Internal Medicine

## 2020-02-29 ENCOUNTER — Other Ambulatory Visit: Payer: Self-pay | Admitting: *Deleted

## 2020-02-29 NOTE — Patient Outreach (Signed)
[facility]) Care Management  02/29/2020  Savannah Morales May 01, 1965 [id number]   Telephone Assessment / Post-op heart catheterization   Pt recent underwent a heart catheterization on 11/2 under the care of Dr. Gala Romney. Spoke with pt today and inquired on the post op instructions. Pt aware of all appointments and has sufficient transportation. RN reviewed all medications as pt has a sufficient supply and taking according to the way they are prescribed. Pt denies any residual effects from the recent procedure. RN reviewed and discussed the current plan of care as pt continue to be on track. RN strongly encouraged pt to utilized the [facility] calendar to document all reported weights for her providers to view. Verified pt remains in the GREEN zone with no acute symptoms or encountered issues.   Will follow up next month with ongoing case management services and stress the importance of adhering to the plan of care and instructions from her providers as she continues to recover. No inquires or request at this time.   Goals Addressed            This Visit's Progress   . [facility]-Track and Manage Fluids and Swelling   On track    Follow Up Date 04/26/2020   - call office if I gain more than 2 pounds in one day or 5 pounds in one week - track weight in diary - use salt in moderation - watch for swelling in feet, ankles and legs every day - weigh myself daily    Why is this important?   It is important to check your weight daily and watch how much salt and liquids you have.  It will help you to manage your heart failure.    Notes: Recent heart cath. Will reiterated no pt's awareness of what to do if acute symptoms should occur. Will verified pt is in the GREEN zone today.    . THN-Track and Manage Symptoms   On track    Follow Up Date 04/26/2020   - begin a heart failure diary - develop a rescue plan - follow rescue plan if symptoms flare-up - know when to call the  doctor - track symptoms and what helps feel better or worse    Why is this important?   You will be able to handle your symptoms better if you keep track of them.  Making some simple changes to your lifestyle will help.  Eating healthy is one thing you can do to take good care of yourself.    Notes: 11/2 pt underwent heart cath. RN reiterated on following the HF zone with any precipitating symptoms.       Elliot Cousin, RN Care Management Coordinator [facility] Main Office [phone]

## 2020-03-06 ENCOUNTER — Ambulatory Visit: Payer: 59 | Admitting: Cardiology

## 2020-03-07 ENCOUNTER — Other Ambulatory Visit: Payer: Self-pay | Admitting: Family Medicine

## 2020-03-07 DIAGNOSIS — E119 Type 2 diabetes mellitus without complications: Secondary | ICD-10-CM

## 2020-03-08 ENCOUNTER — Other Ambulatory Visit: Payer: Self-pay

## 2020-03-08 ENCOUNTER — Telehealth: Payer: Self-pay | Admitting: Radiology

## 2020-03-08 ENCOUNTER — Ambulatory Visit: Payer: 59 | Admitting: Cardiology

## 2020-03-08 VITALS — BP 90/60 | HR 68 | Ht 68.0 in | Wt 183.4 lb

## 2020-03-08 DIAGNOSIS — I493 Ventricular premature depolarization: Secondary | ICD-10-CM

## 2020-03-08 DIAGNOSIS — E1159 Type 2 diabetes mellitus with other circulatory complications: Secondary | ICD-10-CM

## 2020-03-08 DIAGNOSIS — I509 Heart failure, unspecified: Secondary | ICD-10-CM | POA: Diagnosis not present

## 2020-03-08 DIAGNOSIS — E119 Type 2 diabetes mellitus without complications: Secondary | ICD-10-CM

## 2020-03-08 DIAGNOSIS — I152 Hypertension secondary to endocrine disorders: Secondary | ICD-10-CM

## 2020-03-08 NOTE — Progress Notes (Signed)
Electrophysiology Office Follow up Visit Note:    Date:  03/08/2020   ID:  Savannah Morales, DOB 1965-11-26, MRN [id number]  PCP:  Ardith Dark, MD  [facility] Cardiologist:  Reatha Harps, MD  [facility] Electrophysiologist:  Lanier Prude, MD    Interval History:    Savannah Morales is a 54 y.o. female who presents for a follow up visit. They were last seen in clinic February 12, 2020.  At that visit was after a PVC ablation which occurred on January 31, 2020 which was only partially successful.  The patient continued to have PVCs and was seen by Dr. Gala Romney on October 28.  At that visit there was concern that she may be low output so a left and right heart catheterization were planned.  Heart cath was performed on February 27, 2020.  Catheterization showed normal coronaries, low filling pressures and normal cardiac indices (Fick 3.1, Thermo 2.6).  She was discharged with a new prescription for [facility].    She tells me that she is feeling better in general with better exercise tolerance.  She is with her daughter today who confirms this to be true.  Her daughter says there is a significant change in her symptom burden.  She is hesitant to start medications given the recent increase in her number of prescriptions although if she continues to have PVCs that are thought to be contributing to her cardiomyopathy she would be willing to try new medication.  No syncope or presyncope.    Past Medical History:  Diagnosis Date  . Diabetes mellitus without complication (HCC)    Type II  . History of chicken pox   . Hypertension   . Kidney stones     Past Surgical History:  Procedure Laterality Date  . ANKLE SURGERY    . PVC ABLATION N/A 01/31/2020   Procedure: PVC ABLATION;  Surgeon: Lanier Prude, MD;  Location: [facility] INVASIVE CV LAB;  Service: Cardiovascular;  Laterality: N/A;  . RIGHT/LEFT HEART CATH AND CORONARY ANGIOGRAPHY N/A 02/27/2020   Procedure: RIGHT/LEFT HEART  CATH AND CORONARY ANGIOGRAPHY;  Surgeon: Dolores Patty, MD;  Location: [facility] INVASIVE CV LAB;  Service: Cardiovascular;  Laterality: N/A;    Current Medications: Current Meds  Medication Sig  . benzoyl peroxide-erythromycin (BENZAMYCIN) gel Apply 1 application topically daily.   . digoxin (LANOXIN) 0.125 MG tablet Take 1 tablet (0.125 mg total) by mouth daily.  [name] ELDERBERRY PO Take 2 tablets by mouth daily.   . furosemide (LASIX) 40 MG tablet Take 1 tablet (40 mg total) by mouth every other day. (Patient taking differently: Take 40 mg by mouth daily as needed for fluid. )  . Garlic 1000 MG CAPS Take 1,000 mg by mouth daily.   . metFORMIN (GLUCOPHAGE-XR) 500 MG 24 hr tablet TAKE 1 TABLET BY MOUTH DAILY WITH BREAKFAST  . metoprolol succinate (TOPROL-XL) 50 MG 24 hr tablet Take 1 tablet (50 mg total) by mouth daily. Take with or immediately following a meal.  . Omega-3 Fatty Acids (FISH OIL) 1200 MG CPDR Take 2,400 capsules by mouth daily.   . Potassium Chloride ER 20 MEQ TBCR Take 20 mEq by mouth every other day. (Patient taking differently: Take 20 mEq by mouth daily as needed (Take with Fursomide). )  . sacubitril-valsartan (ENTRESTO) 24-26 MG Take 1 tablet by mouth 2 (two) times daily.  [name] spironolactone (ALDACTONE) 25 MG tablet Take 0.5 tablets (12.5 mg total) by mouth daily.  Allergies:   Patient has no known allergies.   Social History   Socioeconomic History  . Marital status: Divorced    Spouse name: Not on file  . Number of children: 3  . Years of education: 70  . Highest education level: Not on file  Occupational History  . Occupation: Actor   Tobacco Use  . Smoking status: Never Smoker  . Smokeless tobacco: Never Used  Vaping Use  . Vaping Use: Never used  Substance and Sexual Activity  . Alcohol use: Not Currently    Comment: Rarely  . Drug use: No  . Sexual activity: Yes  Other Topics Concern  . Not on file  Social History Narrative   . Not on file   Social Determinants of Health   Financial Resource Strain:   . Difficulty of Paying Living Expenses: Not on file  Food Insecurity: No Food Insecurity  . Worried About Programme researcher, broadcasting/film/video in the Last Year: Never true  . Ran Out of Food in the Last Year: Never true  Transportation Needs: No Transportation Needs  . Lack of Transportation (Medical): No  . Lack of Transportation (Non-Medical): No  Physical Activity:   . Days of Exercise per Week: Not on file  . Minutes of Exercise per Session: Not on file  Stress:   . Feeling of Stress : Not on file  Social Connections:   . Frequency of Communication with Friends and Family: Not on file  . Frequency of Social Gatherings with Friends and Family: Not on file  . Attends Religious Services: Not on file  . Active Member of Clubs or Organizations: Not on file  . Attends Banker Meetings: Not on file  . Marital Status: Not on file     Family History: The patient's family history includes Cancer in her paternal grandmother; Diabetes in her father; Hyperlipidemia in her mother; Hypertension in her father.  ROS:   Please see the history of present illness.    All other systems reviewed and are negative.  EKGs/Labs/Other Studies Reviewed:    The following studies were reviewed today: Heart catheterization  February 27, 2020 heart catheterization personally reviewed Normal coronaries, severe nonischemic cardiomyopathy, ejection fraction 20 to 25%, low filling pressures with normal cardiac output    EKG:  The ekg ordered today demonstrates sinus rhythm.  No PVCs.  Recent Labs: 12/23/2019: ALT 49 12/25/2019: TSH 0.341 12/27/2019: Magnesium 1.9 02/22/2020: B Natriuretic Peptide 1,341.4; BUN 18; Creatinine, Ser 0.77; Platelets 221 02/27/2020: Hemoglobin 14.3; Potassium 3.3; Sodium 140  Recent Lipid Panel    Component Value Date/Time   CHOL 160 12/25/2019 0807   TRIG 60 12/25/2019 0807   HDL 50 12/25/2019  0807   CHOLHDL 3.2 12/25/2019 0807   VLDL 12 12/25/2019 0807   LDLCALC 98 12/25/2019 0807    Physical Exam:    VS:  BP 90/60   Pulse 68   Ht 5\' 8"  (1.727 m)   Wt 183 lb 6.4 oz (83.2 kg)   SpO2 97%   BMI 27.89 kg/m     Wt Readings from Last 3 Encounters:  03/08/20 183 lb 6.4 oz (83.2 kg)  02/22/20 187 lb 2 oz (84.9 kg)  02/12/20 183 lb 3.2 oz (83.1 kg)     GEN:  Well nourished, well developed in no acute distress HEENT: Normal NECK: No JVD; No carotid bruits LYMPHATICS: No lymphadenopathy CARDIAC: RRR, no murmurs, rubs, gallops.  Very rare PVCs on auscultation RESPIRATORY:  Clear  to auscultation without rales, wheezing or rhonchi  ABDOMEN: Soft, non-tender, non-distended MUSCULOSKELETAL:  No edema; No deformity  SKIN: Warm and dry NEUROLOGIC:  Alert and oriented x 3 PSYCHIATRIC:  Normal affect   ASSESSMENT:    1. PVC's (premature ventricular contractions)   2. Congestive heart failure, unspecified HF chronicity, unspecified heart failure type (HCC)   3. Hypertension associated with diabetes (HCC)   4. Type 2 diabetes mellitus without complication, without long-term current use of insulin (HCC)    PLAN:    In order of problems listed above:  1. Nonischemic cardiomyopathy NYHA class II symptoms today.  Etiology of her cardiomyopathy is likely related to PVCs which have been very frequent in the past.  On last ZIO monitor was 13%.  Has recently been started on Entresto which she believes may have improved her symptoms significantly.  During today's exam, her PVC burden is extremely low on ECG and auscultation.  The patient and her family are hesitant to start any medication which I think is a very reasonable feeling.  I told her we would start with a 3-day ZIO monitor to reassess her PVC burden.  If she is still having a frequent PVC, we discussed starting flecainide and she is agreeable.  Once I have the results of the ZIO monitor, I will make the decision on whether or not  to start the flecainide.  If we start flecainide, no reason to come back into clinic and I would just have it called in.  She will continue the metoprolol along with the flecainide.  Overall I am very encouraged by her improvement in symptoms since starting the [facility] and may be by optimizing the hemodynamics, can avoid further invasive procedure for the PVC.  2.  Hypertension Blood pressure 90/60 on today's visit.  No symptoms of orthostatic intolerance or dizziness.  Continue current regimen.  3.  Diabetes Continue current regimen.  Follow-up 3 months.  Medication Adjustments/Labs and Tests Ordered: Current medicines are reviewed at length with the patient today.  Concerns regarding medicines are outlined above.  Orders Placed This Encounter  Procedures  . LONG TERM MONITOR (3-14 DAYS)   No orders of the defined types were placed in this encounter.    Signed, Steffanie Dunn, MD, [facility]  03/08/2020 1:59 PM    Electrophysiology [facility]

## 2020-03-08 NOTE — Patient Instructions (Addendum)
Medication Instructions:  Your physician recommends that you continue on your current medications as directed. Please refer to the Current Medication list given to you today.  Labwork: None ordered.  Testing/Procedures: Your physician has recommended that you wear a holter monitor. Holter monitors are medical devices that record the heart's electrical activity. Doctors most often use these monitors to diagnose arrhythmias. Arrhythmias are problems with the speed or rhythm of the heartbeat. The monitor is a small, portable device. You can wear one while you do your normal daily activities. This is usually used to diagnose what is causing palpitations/syncope (passing out).  Please schedule for a 3 day ZIO monitor   Follow-Up: Your physician wants you to follow-up in: 3 months with Dr. Lalla Brothers.     June 12, 2019 at 10:45 am at the [facility] office   Any Other Special Instructions Will Be Listed Below (If Applicable).  If you need a refill on your cardiac medications before your next appointment, please call your pharmacy.   ZIO XT- Long Term Monitor Instructions   Your physician has requested you wear your ZIO patch monitor_3_days.   This is a single patch monitor.  [employer] supplies one patch monitor per enrollment.  Additional stickers are not available.   Please do not apply patch if you will be having a Nuclear Stress Test, Echocardiogram, Cardiac CT, MRI, or Chest Xray during the time frame you would be wearing the monitor. The patch cannot be worn during these tests.  You cannot remove and re-apply the ZIO XT patch monitor.   Your ZIO patch monitor will be sent USPS Priority mail from [facility] directly to your home address. The monitor may also be mailed to a PO BOX if home delivery is not available.   It may take 3-5 days to receive your monitor after you have been enrolled.   Once you have received you monitor, please review enclosed instructions.  Your monitor  has already been registered assigning a specific monitor serial # to you.   Applying the monitor   Shave hair from upper left chest.   Hold abrader disc by orange tab.  Rub abrader in 40 strokes over left upper chest as indicated in your monitor instructions.   Clean area with 4 enclosed alcohol pads .  Use all pads to assure are is cleaned thoroughly.  Let dry.   Apply patch as indicated in monitor instructions.  Patch will be place under collarbone on left side of chest with arrow pointing upward.   Rub patch adhesive wings for 2 minutes.Remove white label marked "1".  Remove white label marked "2".  Rub patch adhesive wings for 2 additional minutes.   While looking in a mirror, press and release button in center of patch.  A small green light will flash 3-4 times .  This will be your only indicator the monitor has been turned on.     Do not shower for the first 24 hours.  You may shower after the first 24 hours.   Press button if you feel a symptom. You will hear a small click.  Record Date, Time and Symptom in the Patient Log Book.   When you are ready to remove patch, follow instructions on last 2 pages of Patient Log Book.  Stick patch monitor onto last page of Patient Log Book.   Place Patient Log Book in [place] box.  Use locking tab on box and tape box closed securely.  The [place] and [employer]  has prepaid postage on it.  Please place in mailbox as soon as possible.  Your physician should have your test results approximately 7 days after the monitor has been mailed back to [facility].   Call [facility] Customer Care at [phone] if you have questions regarding your ZIO XT patch monitor.  Call them immediately if you see an orange light blinking on your monitor.   If your monitor falls off in less than 4 days contact our Monitor department at [phone].  If your monitor becomes loose or falls off after 4 days call [employer] at [phone] for suggestions on securing  your monitor.

## 2020-03-08 NOTE — Telephone Encounter (Signed)
Enrolled patient for a 3 day Zio XT monitor to be mailed to patients home  

## 2020-03-13 ENCOUNTER — Telehealth (HOSPITAL_COMMUNITY): Payer: Self-pay | Admitting: Internal Medicine

## 2020-03-13 NOTE — Telephone Encounter (Signed)
Please fax FMLA forms to Lorna Few @[phone] , by Friday.

## 2020-03-14 ENCOUNTER — Other Ambulatory Visit: Payer: Self-pay

## 2020-03-14 ENCOUNTER — Encounter: Payer: Self-pay | Admitting: Cardiovascular Disease

## 2020-03-14 ENCOUNTER — Ambulatory Visit (INDEPENDENT_AMBULATORY_CARE_PROVIDER_SITE_OTHER): Payer: 59 | Admitting: Cardiovascular Disease

## 2020-03-14 ENCOUNTER — Ambulatory Visit (HOSPITAL_COMMUNITY)
Admission: RE | Admit: 2020-03-14 | Discharge: 2020-03-14 | Disposition: A | Discharge status: Home / Self Care | Payer: 59 | Source: Ambulatory Visit | Attending: Pharmacist | Admitting: Pharmacist

## 2020-03-14 VITALS — BP 114/80 | HR 75 | Wt 181.0 lb

## 2020-03-14 VITALS — BP 93/53 | HR 45 | Ht 68.0 in | Wt 181.0 lb

## 2020-03-14 DIAGNOSIS — E118 Type 2 diabetes mellitus with unspecified complications: Secondary | ICD-10-CM | POA: Diagnosis not present

## 2020-03-14 DIAGNOSIS — I5022 Chronic systolic (congestive) heart failure: Secondary | ICD-10-CM | POA: Diagnosis not present

## 2020-03-14 DIAGNOSIS — Z7901 Long term (current) use of anticoagulants: Secondary | ICD-10-CM | POA: Insufficient documentation

## 2020-03-14 DIAGNOSIS — I5041 Acute combined systolic (congestive) and diastolic (congestive) heart failure: Secondary | ICD-10-CM | POA: Insufficient documentation

## 2020-03-14 DIAGNOSIS — Z79899 Other long term (current) drug therapy: Secondary | ICD-10-CM | POA: Insufficient documentation

## 2020-03-14 DIAGNOSIS — I110 Hypertensive heart disease with heart failure: Secondary | ICD-10-CM | POA: Insufficient documentation

## 2020-03-14 DIAGNOSIS — Z7984 Long term (current) use of oral hypoglycemic drugs: Secondary | ICD-10-CM | POA: Diagnosis not present

## 2020-03-14 DIAGNOSIS — I493 Ventricular premature depolarization: Secondary | ICD-10-CM

## 2020-03-14 DIAGNOSIS — I509 Heart failure, unspecified: Secondary | ICD-10-CM

## 2020-03-14 LAB — BASIC METABOLIC PANEL
Anion gap: 11 (ref 5–15)
BUN: 9 mg/dL (ref 6–20)
CO2: 24 mmol/L (ref 22–32)
Calcium: 9.7 mg/dL (ref 8.9–10.3)
Chloride: 105 mmol/L (ref 98–111)
Creatinine, Ser: 0.74 mg/dL (ref 0.44–1.00)
GFR, Estimated: >60 mL/min (ref >60)
Glucose, Bld: 114 mg/dL — ABNORMAL HIGH (ref 70–99)
Potassium: 4.2 mmol/L (ref 3.5–5.1)
Sodium: 140 mmol/L (ref 135–145)

## 2020-03-14 LAB — DIGOXIN LEVEL: Digoxin Level: 0.4 ng/mL — ABNORMAL LOW (ref 1.0–2.0)

## 2020-03-14 MED ORDER — EMPAGLIFLOZIN 10 MG PO TABS: 10.0000 mg | ORAL_TABLET | Freq: Every day | ORAL | 11 refills | Status: DC

## 2020-03-14 NOTE — Patient Instructions (Addendum)
It was a pleasure seeing you today!  MEDICATIONS: -We are changing your medications today -Start Jardiance 10 mg (1 tablet) daily -Call if you have questions about your medications.  LABS: -We will call you if your labs need attention.  NEXT APPOINTMENT: Return to clinic in 4 weeks with Pharmacy Clinic.  In general, to take care of your heart failure: -Limit your fluid intake to 2 Liters (half-gallon) per day.   -Limit your salt intake to ideally 2-3 grams (2000-3000 mg) per day. -Weigh yourself daily and record, and bring that "weight diary" to your next appointment.  (Weight gain of 2-3 pounds in 1 day typically means fluid weight.) -The medications for your heart are to help your heart and help you live longer.   -Please contact us before stopping any of your heart medications.  Call the clinic at [phone] with questions or to reschedule future appointments.  

## 2020-03-14 NOTE — Patient Instructions (Addendum)
Medication Instructions:  Your Physician recommend you continue on your current medication as directed.    *If you need a refill on your cardiac medications before your next appointment, please call your pharmacy*   Lab Work: None  Testing/Procedures: None   Follow-Up: At [facility], you and your health needs are our priority.  As part of our continuing mission to provide you with exceptional heart care, we have created designated Provider Care Teams.  These Care Teams include your primary Cardiologist (physician) and Advanced Practice Providers (APPs -  Physician Assistants and Nurse Practitioners) who all work together to provide you with the care you need, when you need it.  We recommend signing up for the patient portal called "MyChart".  Sign up information is provided on this After Visit Summary.  MyChart is used to connect with patients for Virtual Visits (Telemedicine).  Patients are able to view lab/test results, encounter notes, upcoming appointments, etc.  Non-urgent messages can be sent to your provider as well.   To learn more about what you can do with MyChart, go to [email or web address].    Your next appointment:   1 year(s)  The format for your next appointment:   In Person  Provider:   Lennie Odor, MD

## 2020-03-14 NOTE — Progress Notes (Signed)
Cardiology Office Note:   Date:  03/14/2020  NAME:  Savannah Morales    MRN: [id number] DOB:  February 26, 1966   PCP:  Ardith Dark, MD  Cardiologist:  Reatha Harps, MD  Electrophysiologist:  Lanier Prude, MD   Referring MD: Ardith Dark, MD   Chief Complaint  Patient presents with  . Congestive Heart Failure   History of Present Illness:   Savannah Morales is a 54 y.o. female with a hx of systolic HF, PVCs, DM who presents for follow-up. Underwent PVC ablation that was partially successful. PVC burden on recent monitor was 13%. She was evaluated by CHF clinic and underwent left and right heart cath. She had normal coronaries, which was expected.  She remains on digoxin, metoprolol succinate, Entresto and Aldactone.  She appears to be doing well with this regimen.  Blood pressure is 93/53 but she denies any symptoms of lightheadedness or dizziness.  On examination she has no PVCs.  Hopefully this will continue to improve.  She is now following in the advanced heart failure clinic.  I doubt she will tolerate further titration.  She is working with the pharmacy clinic in the advanced heart failure clinic.  She has plans for another monitor per Dr. Lalla Brothers.  He wants to make sure her PVCs have resolved.  She will be due for a repeat echocardiogram in the next 2 to 3 months.  Overall doing well.  Problem List 1. 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 PVCs -s/p PVC ablation 01/31/2020 3. DM -A1c 6.6 -T chol 160, HDL 50, LDL 98, TG 60  Past Medical History: Past Medical History:  Diagnosis Date  . Diabetes mellitus without complication (HCC)    Type II  . History of chicken pox   . Hypertension   . Kidney stones     Past Surgical History: Past Surgical History:  Procedure Laterality Date  . ANKLE SURGERY    . PVC ABLATION N/A 01/31/2020   Procedure: PVC ABLATION;  Surgeon: Lanier Prude, MD;  Location: [facility] INVASIVE CV LAB;  Service:  Cardiovascular;  Laterality: N/A;  . RIGHT/LEFT HEART CATH AND CORONARY ANGIOGRAPHY N/A 02/27/2020   Procedure: RIGHT/LEFT HEART CATH AND CORONARY ANGIOGRAPHY;  Surgeon: Dolores Patty, MD;  Location: [facility] INVASIVE CV LAB;  Service: Cardiovascular;  Laterality: N/A;    Current Medications: Current Meds  Medication Sig  . benzoyl peroxide-erythromycin (BENZAMYCIN) gel Apply 1 application topically daily.   . digoxin (LANOXIN) 0.125 MG tablet Take 1 tablet (0.125 mg total) by mouth daily.  [name] ELDERBERRY PO Take 2 tablets by mouth daily.   . empagliflozin (JARDIANCE) 10 MG TABS tablet Take 1 tablet (10 mg total) by mouth daily before breakfast.  . furosemide (LASIX) 40 MG tablet Take 1 tablet (40 mg total) by mouth every other day. (Patient taking differently: Take 40 mg by mouth daily as needed for fluid. )  . Garlic 1000 MG CAPS Take 1,000 mg by mouth daily.   . metFORMIN (GLUCOPHAGE-XR) 500 MG 24 hr tablet TAKE 1 TABLET BY MOUTH DAILY WITH BREAKFAST  . metoprolol succinate (TOPROL-XL) 50 MG 24 hr tablet Take 1 tablet (50 mg total) by mouth daily. Take with or immediately following a meal.  . Omega-3 Fatty Acids (FISH OIL) 1200 MG CPDR Take 2,400 capsules by mouth daily.   . Potassium Chloride ER 20 MEQ TBCR Take 20 mEq by mouth every other day. (Patient taking differently: Take 20  mEq by mouth daily as needed (Take with Fursomide). )  . sacubitril-valsartan (ENTRESTO) 24-26 MG Take 1 tablet by mouth 2 (two) times daily.  [name] spironolactone (ALDACTONE) 25 MG tablet Take 0.5 tablets (12.5 mg total) by mouth daily.     Allergies:    Patient has no known allergies.   Social History: Social History   Socioeconomic History  . Marital status: Divorced    Spouse name: Not on file  . Number of children: 3  . Years of education: 25  . Highest education level: Not on file  Occupational History  . Occupation: Actor   Tobacco Use  . Smoking status: Never Smoker  .  Smokeless tobacco: Never Used  Vaping Use  . Vaping Use: Never used  Substance and Sexual Activity  . Alcohol use: Not Currently    Comment: Rarely  . Drug use: No  . Sexual activity: Yes  Other Topics Concern  . Not on file  Social History Narrative  . Not on file   Social Determinants of Health   Financial Resource Strain:   . Difficulty of Paying Living Expenses: Not on file  Food Insecurity: No Food Insecurity  . Worried About Programme researcher, broadcasting/film/video in the Last Year: Never true  . Ran Out of Food in the Last Year: Never true  Transportation Needs: No Transportation Needs  . Lack of Transportation (Medical): No  . Lack of Transportation (Non-Medical): No  Physical Activity:   . Days of Exercise per Week: Not on file  . Minutes of Exercise per Session: Not on file  Stress:   . Feeling of Stress : Not on file  Social Connections:   . Frequency of Communication with Friends and Family: Not on file  . Frequency of Social Gatherings with Friends and Family: Not on file  . Attends Religious Services: Not on file  . Active Member of Clubs or Organizations: Not on file  . Attends Banker Meetings: Not on file  . Marital Status: Not on file     Family History: The patient's family history includes Cancer in her paternal grandmother; Diabetes in her father; Hyperlipidemia in her mother; Hypertension in her father.  ROS:   All other ROS reviewed and negative. Pertinent positives noted in the HPI.     EKGs/Labs/Other Studies Reviewed:   The following studies were personally reviewed by me today:   LHC/RHC 02/27/2020 1. Normal coronaries 2. Severe NICM EF 20-25% 3. Low filling pressures with normal cardiac output  TTE 12/24/2019 1. Left ventricular ejection fraction, by estimation, is 30 to 35%. The  left ventricle has moderately decreased function. The left ventricle  demonstrates global hypokinesis. The left ventricular internal cavity size  was moderately  dilated. Left  ventricular diastolic parameters are consistent with Grade I diastolic  dysfunction (impaired relaxation).  2. Right ventricular systolic function is normal. The right ventricular  size is normal. There is normal pulmonary artery systolic pressure.  3. Left atrial size was severely dilated.  4. The mitral valve is normal in structure. Moderate mitral valve  regurgitation. No evidence of mitral stenosis.  5. The aortic valve is normal in structure. Aortic valve regurgitation is  not visualized. No aortic stenosis is present.  6. The inferior vena cava is normal in size with greater than 50%  respiratory variability, suggesting right atrial pressure of 3 mmHg.    Recent Labs: 12/23/2019: ALT 49 12/25/2019: TSH 0.341 12/27/2019: Magnesium 1.9 02/22/2020: B Natriuretic Peptide  1,341.4; Platelets 221 02/27/2020: Hemoglobin 14.3 03/14/2020: BUN 9; Creatinine, Ser 0.74; Potassium 4.2; Sodium 140   Recent Lipid Panel    Component Value Date/Time   CHOL 160 12/25/2019 0807   TRIG 60 12/25/2019 0807   HDL 50 12/25/2019 0807   CHOLHDL 3.2 12/25/2019 0807   VLDL 12 12/25/2019 0807   LDLCALC 98 12/25/2019 0807    Physical Exam:   VS:  BP (!) 93/53 (BP Location: Left Arm, Patient Position: Sitting)   Pulse (!) 45   Ht 5\' 8"  (1.727 m)   Wt 181 lb (82.1 kg)   SpO2 99%   BMI 27.52 kg/m    Wt Readings from Last 3 Encounters:  03/14/20 181 lb (82.1 kg)  03/14/20 181 lb (82.1 kg)  03/08/20 183 lb 6.4 oz (83.2 kg)    General: Well nourished, well developed, in no acute distress Heart: Atraumatic, normal size  Eyes: PEERLA, EOMI  Neck: Supple, no JVD Endocrine: No thryomegaly Cardiac: Normal S1, S2; RRR; no murmurs, rubs, or gallops Lungs: Clear to auscultation bilaterally, no wheezing, rhonchi or rales  Abd: Soft, nontender, no hepatomegaly  Ext: No edema, pulses 2+ Musculoskeletal: No deformities, BUE and BLE strength normal and equal Skin: Warm and dry, no rashes     Neuro: Alert and oriented to person, place, time, and situation, CNII-XII grossly intact, no focal deficits  Psych: Normal mood and affect   ASSESSMENT:   Savannah Morales is a 54 y.o. female who presents for the following: 1. Chronic systolic heart failure (HCC)   2. PVC's (premature ventricular contractions)     PLAN:   1. Chronic systolic heart failure (HCC) 2. PVC's (premature ventricular contractions) -EF around 30%.  Lower on MRI.  Normal coronary arteries.  Low filling pressures on recent right heart cath.  She is now following an advanced heart failure clinic.  She is on digoxin, Entresto 24-26 mg twice daily, Aldactone 12.5 mg daily, Metformin 50 mg daily.  She appears to be stable on this regimen.  Blood pressure is 93/53.  I doubt she will tolerate further titration. 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  She underwent PVC ablation with Dr. 44.  Appears to have been coming from the LVOT.  She also underwent cardiac MRI with no significant scar in the heart. -She is euvolemic on exam and doing well.  I did inform her that she is seeing EP as well as advanced heart failure.  I am adding very little to her care.  I will plan to see her back in 1 year.  Disposition: Return in about 1 year (around 03/14/2021).  Medication Adjustments/Labs and Tests Ordered: Current medicines are reviewed at length with the patient today.  Concerns regarding medicines are outlined above.  No orders of the defined types were placed in this encounter.  No orders of the defined types were placed in this encounter.   Patient Instructions  Medication Instructions:  Your Physician recommend you continue on your current medication as directed.    *If you need a refill on your cardiac medications before your next appointment, please call your pharmacy*   Lab Work: None  Testing/Procedures: None   Follow-Up: At [facility], you and your health needs are our priority.  As part of  our continuing mission to provide you with exceptional heart care, we have created designated Provider Care Teams.  These Care Teams include your primary Cardiologist (physician) and Advanced Practice Providers (APPs -  Physician Assistants and Nurse  Practitioners) who all work together to provide you with the care you need, when you need it.  We recommend signing up for the patient portal called "MyChart".  Sign up information is provided on this After Visit Summary.  MyChart is used to connect with patients for Virtual Visits (Telemedicine).  Patients are able to view lab/test results, encounter notes, upcoming appointments, etc.  Non-urgent messages can be sent to your provider as well.   To learn more about what you can do with MyChart, go to [email or web address].    Your next appointment:   1 year(s)  The format for your next appointment:   In Person  Provider:   Lennie Odor, MD       Time Spent with Patient: I have spent a total of 25 minutes with patient reviewing hospital notes, telemetry, EKGs, labs and examining the patient as well as establishing an assessment and plan that was discussed with the patient.  > 50% of time was spent in direct patient care.  Signed, Lenna Gilford. Flora Lipps, MD [facility]  [address], [address] [place], [place] [address] [phone]  03/14/2020 2:50 PM

## 2020-03-14 NOTE — Progress Notes (Signed)
Primary Care Provider: Ardith Dark, MD [facility] Cardiologist: Reatha Harps, MD [facility] Electrophysiologist:  Lanier Prude, MD  Advanced HF Cardiologist: Dr. Gala Romney  HPI:  Savannah Morales is a 54 y.o. female with a hx of DM, HTN, and new onset systolic HF.  On 12/24/19, she was admitted with worsening SOB and cough. She said she was doing fine until she got the COVID vaccine Proofreader) in May 2021. Echo showed EF 35%. She was diuresed and GDMT was started. No CP or coronary calcium, so cath was not felt to be warranted.  cMRI on 9/21 with severely dilated LV, EF 21% with normal wall thickness, diffuse hypokinesis. RVEF 26%. Severe left atrial enlargement, moderate right atrial enlargement. Moderate to severe central mitral regurgitation with regurgitant fraction 48%. No LGE.  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 Lalla Brothers.   On 02/22/20, she was seen by Dr. Gala Romney for initial HF visit. She was able to walk on flat ground without issues but got SOB with too much exertion or while going up a hill. She took furosemide every other day and noted getting more SOB with cough and orthopnea in between doses. She had mild LEE.  R/LHC on 02/27/20 showed severe NICM with EF 20-25%, low filling pressures with normal cardiac output. Normal coronaries. (RA 2, PA 21, PCW 5, CI 3.1 fick, thermo CI 2.6, PVR 2.6, SVR 952)  Today she returns to HF clinic for pharmacist medication titration. At last visit with MD, spironolactone 12.5 mg daily and digoxin 0.125 mg daily were started, metoprolol succinate was decreased to 50 mg daily, and lisinopril was switched to Entresto 24/26 mg BID after a 36 hour washout period. She is feeling much better since making these medication changes and does not feel out of breath anymore. Has not yet taken medications today and BP is 114/80. No dizziness, lightheadedness, or fatigue. No CP or palpitations. She is  taking furosemide 40 mg every other day and does not require extra doses. Home weight stable at ~180 lbs. No longer has LEE or PND, uses 2 pillows at night. Appetite is good. She stopped drinking [facility] and follows a low-salt diet.  HF Medications: Metoprolol succinate 50 mg daily Entresto 24/26 mg BID Spironolactone 12.5 mg daily Digoxin 0.125 mg daily Furosemide 40 mg every other day Potassium Chloride 20 mEq every other day  Has the patient been experiencing any side effects to the medications prescribed?  no  Does the patient have any problems obtaining medications due to transportation or finances?   No - has Multimedia programmer.  Understanding of regimen: good Understanding of indications: good Potential of compliance: good Patient understands to avoid NSAIDs. Patient understands to avoid decongestants.    Pertinent Lab Values: . Serum creatinine 0.74, BUN 9, Potassium 4.2, Sodium 140, Digoxin level pending   Vital Signs: . Weight: 181 lb (last clinic weight: 187 lbs) . Blood pressure: 114/80  . Heart rate: 75   Assessment: 1. Acute combined systolic and diastolic CHF: - Diagnosed 8/21 EF 30-35% - cMRI 9/21 LVEF 21% RV EF 26% mod-sev MR. 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 11/8 Zio patch results with frequent PVC burden 13%, 8 episodes of NSVT, longest lasting 12.7 seconds w/ rate 128 bpm. F/u Zio XT monitor pending. - NYHA II symptoms. Euvolemic on exam. - Continue furosemide 40 mg every other  day and potassium chloride 20 mEq every other day. - Continue metoprolol succinate 50 mg daily - Continue Entresto 24/26 mg BID. - Continue spironolactone 12.5 mg daily - Start Jardiance (empagliflozin) 10 mg daily - Continue digoxin 0.125 mg daily. Digoxin level pending.   2.  Frequent PVCs: - S/p PVC ablation 01/31/20 with Dr. Lalla Brothers - Has f/u Zio patch pending to re-quantify - Previously noted to need sleep  study at some point.    Plan: 1) Medication changes: Based on clinical presentation, vital signs and recent labs will start Jardiance 10 mg daily 2) Follow-up: Pharmacy clinic in 4-weeks  Tama Headings, PharmD PGY2 Cardiology Pharmacy Resident  Karle Plumber, PharmD, BCPS, [facility], CPP Heart Failure Clinic Pharmacist [phone]

## 2020-03-15 ENCOUNTER — Other Ambulatory Visit (INDEPENDENT_AMBULATORY_CARE_PROVIDER_SITE_OTHER): Payer: 59

## 2020-03-15 DIAGNOSIS — I493 Ventricular premature depolarization: Secondary | ICD-10-CM | POA: Diagnosis not present

## 2020-03-15 NOTE — Telephone Encounter (Signed)
Forms completed and signed by Dr Gala Romney. Forms faxed, pt aware and copy mailed to her

## 2020-03-26 ENCOUNTER — Encounter (HOSPITAL_COMMUNITY): Payer: Self-pay

## 2020-03-28 ENCOUNTER — Other Ambulatory Visit (HOSPITAL_COMMUNITY): Payer: Self-pay | Admitting: *Deleted

## 2020-03-28 MED ORDER — FLUCONAZOLE 150 MG PO TABS: 150.0000 mg | ORAL_TABLET | Freq: Every day | ORAL | 0 refills | Status: DC

## 2020-03-29 ENCOUNTER — Other Ambulatory Visit: Payer: Self-pay | Admitting: *Deleted

## 2020-03-29 NOTE — Patient Outreach (Signed)
[facility]) Care Management  03/29/2020  Savannah Morales [birth year] [id number]   Telephone Assessment-Successful-Heart Failure  RN spoke with pt today and received an update on her ongoing management of care related to her HF. Pt reports some changes in her medications and she has returned to the workforce. Pt denies any encountered symptoms and continues to maintain her baseline weights in the AM around 180-183 lbs with no residual swelling or symptoms related to HF (remains in the GREEN zone). Plan of care reviewed and discussed with updated changes to allow ongoing adherence with pt's management of care related to her HF. Will verified ongoing adherence with supportive care for medications and medical appointments. Will also update Dr. [name] on pt's disposition with [facility] services.   No other inquires or requested as RN continue to extend praises as pt continues to progress through the program in managing her HF independently. Will follow up next month on pt's ongoing management of care.  Goals Addressed            This Visit's Progress   . THN-Track and Manage Fluids and Swelling   On track    Follow Up Date: 05/27/2020   - call office if I gain more than 2 pounds in one day or 5 pounds in one week - track weight in diary - use salt in moderation - watch for swelling in feet, ankles and legs every day - weigh myself daily    Why is this important?   It is important to check your weight daily and watch how much salt and liquids you have.  It will help you to manage your heart failure.    Notes: Several changes in pt's medication from the recent hear cath. Will extend this gaol to allow adherence with her ongoing management of care.     . THN-Track and Manage Symptoms   On track    Follow Up Date 07/25/2020   - begin a heart failure diary - develop a rescue plan - follow rescue plan if symptoms flare-up - know when to call the doctor - track symptoms and  what helps feel better or worse    Why is this important?   You will be able to handle your symptoms better if you keep track of them.  Making some simple changes to your lifestyle will help.  Eating healthy is one thing you can do to take good care of yourself.    Notes: Will extent this goal to allow adherence with her ongoing management of care.        Elliot Cousin, RN Care Management Coordinator [facility] Main Office [phone]

## 2020-03-29 NOTE — Progress Notes (Signed)
Primary Care Provider: Ardith Dark, MD [facility] Cardiologist: Reatha Harps, MD [facility] Electrophysiologist:  Lanier Prude, MD  Advanced HF Cardiologist: Dr. Gala Romney  HPI:  Savannah Morales is a 54 y.o. female with a hx of DM, HTN, and new onset systolic HF.  On 12/24/19, she was admitted with worsening SOB and cough. She said she was doing fine until she got the COVID vaccine Proofreader) in May 2021. Echo showed EF 35%. She was diuresed and GDMT was started. No CP or coronary calcium, so cath was not felt to be warranted.  cMRI on 9/21 with severely dilated LV, EF 21% with normal wall thickness, diffuse hypokinesis. RVEF 26%. Severe left atrial enlargement, moderate right atrial enlargement. Moderate to severe central mitral regurgitation with regurgitant fraction 48%. No LGE.  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 Lalla Brothers.   On 02/22/20, she was seen by Dr. Gala Romney for initial HF visit. She was able to walk on flat ground without issues but got SOB with too much exertion or while going up a hill. She took furosemide every other day and noted getting more SOB with cough and orthopnea in between doses. She had mild LEE.  R/LHC on 02/27/20 showed severe NICM with EF 20-25%, low filling pressures with normal cardiac output. Normal coronaries. (RA 2, PA 21, PCW 5, CI 3.1 fick, thermo CI 2.6, PVR 2.6, SVR 952)  Recently returned to HF clinic for pharmacist medication titration on 03/14/20. At previous visit with MD, spironolactone 12.5 mg daily and digoxin 0.125 mg daily were started, metoprolol succinate was decreased to 50 mg daily, and lisinopril was switched to Entresto 24/26 mg BID after a 36 hour washout period. She was feeling much better since making those medication changes and did not feel out of breath anymore. Had not yet taken medications that morning and BP was 114/80. No dizziness, lightheadedness, or fatigue. No CP or  palpitations. She was taking furosemide 40 mg every other day and had not required any extra doses. Home weight was stable at ~180 lbs. No longer had LEE or PND, reported using 2 pillows at night. Appetite was good. She had stopped drinking [facility] and was following a low-salt diet.  Today she returns to HF clinic for pharmacist medication titration. At last visit with pharmacy clinic, Jardiance 10 mg daily was initiated. Overall she is feeling well today. No dizziness, lightheadedness, chest pain or palpitations. No SOB/DOE. Walks her dog around her apartment complex for exercise. Weights have been stable at home, ranging 180-183 lbs. Takes furosemide 40 mg every other day and has not needed any extra. No LEE, PND or orthopnea. Taking all medications as prescribed and tolerating all medications.      HF Medications: Metoprolol succinate 50 mg daily Entresto 24/26 mg BID Spironolactone 12.5 mg daily Empagliflozin 10 mg daily Digoxin 0.125 mg daily Furosemide 40 mg every other day Potassium Chloride 20 mEq every other day  Has the patient been experiencing any side effects to the medications prescribed?  no  Does the patient have any problems obtaining medications due to transportation or finances?   No - has Multimedia programmer.  Understanding of regimen: good Understanding of indications: good Potential of compliance: good Patient understands to avoid NSAIDs. Patient understands to avoid decongestants.    Pertinent Lab Values (03/14/20): [name] Serum creatinine 0.74, BUN 9, Potassium 4.2, Sodium 140, Digoxin level 0.4 ng/mL  Vital Signs: . Weight: 183.4  lbs (last clinic weight: 181 lbs) . Blood pressure: 104/68  . Heart rate: 82   Assessment: 1. Acute combined systolic and diastolic CHF: - Diagnosed 8/21 EF 30-35% - cMRI 9/21 LVEF 21% RV EF 26% mod-sev MR. 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 11/8 Zio patch results with  frequent PVC burden 13%, 8 episodes of NSVT, longest lasting 12.7 seconds w/ rate 128 bpm. F/u Zio XT monitor pending. - NYHA II symptoms. Euvolemic on exam. - Continue furosemide 40 mg every other day and potassium chloride 20 mEq every other day. - Continue metoprolol succinate 50 mg daily - Continue Entresto 24/26 mg BID. - Increase spironolactone to 25 mg daily. BMET in 10 days.  - Continue Jardiance (empagliflozin) 10 mg daily - Continue digoxin 0.125 mg daily.   2.  Frequent PVCs: - S/p PVC ablation 01/31/20 with Dr. Lalla Brothers - Has f/u Zio patch pending to re-quantify - Previously noted to need sleep study at some point.    Plan: 1) Medication changes: Based on clinical presentation, vital signs and recent labs will increase spironolactone to 25 mg daily.  2) Follow-up: Dr. Gala Romney in 3-weeks   Karle Plumber, PharmD, BCPS, [facility], CPP Heart Failure Clinic Pharmacist [phone]

## 2020-04-04 ENCOUNTER — Ambulatory Visit: Payer: 59 | Admitting: Cardiology

## 2020-04-04 ENCOUNTER — Encounter: Payer: Self-pay | Admitting: Family Medicine

## 2020-04-11 ENCOUNTER — Other Ambulatory Visit: Payer: Self-pay

## 2020-04-11 ENCOUNTER — Ambulatory Visit (HOSPITAL_COMMUNITY)
Admission: RE | Admit: 2020-04-11 | Discharge: 2020-04-11 | Disposition: A | Discharge status: Home / Self Care | Payer: 59 | Source: Ambulatory Visit | Attending: Cardiology | Admitting: Cardiology

## 2020-04-11 VITALS — BP 104/68 | HR 82 | Wt 183.4 lb

## 2020-04-11 DIAGNOSIS — I5022 Chronic systolic (congestive) heart failure: Secondary | ICD-10-CM

## 2020-04-11 DIAGNOSIS — I110 Hypertensive heart disease with heart failure: Secondary | ICD-10-CM | POA: Insufficient documentation

## 2020-04-11 DIAGNOSIS — Z7984 Long term (current) use of oral hypoglycemic drugs: Secondary | ICD-10-CM | POA: Diagnosis not present

## 2020-04-11 DIAGNOSIS — I493 Ventricular premature depolarization: Secondary | ICD-10-CM | POA: Diagnosis not present

## 2020-04-11 DIAGNOSIS — Z7901 Long term (current) use of anticoagulants: Secondary | ICD-10-CM | POA: Diagnosis not present

## 2020-04-11 DIAGNOSIS — I5041 Acute combined systolic (congestive) and diastolic (congestive) heart failure: Secondary | ICD-10-CM | POA: Insufficient documentation

## 2020-04-11 DIAGNOSIS — Z79899 Other long term (current) drug therapy: Secondary | ICD-10-CM | POA: Diagnosis not present

## 2020-04-11 MED ORDER — SPIRONOLACTONE 25 MG PO TABS: 25.0000 mg | ORAL_TABLET | Freq: Every day | ORAL | 3 refills | Status: DC

## 2020-04-11 NOTE — Patient Instructions (Signed)
It was a pleasure seeing you today!  MEDICATIONS: -We are changing your medications today -Increase spironolactone to 25 mg (1 tablet) daily -Call if you have questions about your medications.  NEXT APPOINTMENT: Return to clinic in 3 weeks with Dr. Gala Romney.  In general, to take care of your heart failure: -Limit your fluid intake to 2 Liters (half-gallon) per day.   -Limit your salt intake to ideally 2-3 grams (2000-3000 mg) per day. -Weigh yourself daily and record, and bring that "weight diary" to your next appointment.  (Weight gain of 2-3 pounds in 1 day typically means fluid weight.) -The medications for your heart are to help your heart and help you live longer.   -Please contact us before stopping any of your heart medications.  Call the clinic at [phone] with questions or to reschedule future appointments.

## 2020-04-21 ENCOUNTER — Encounter (HOSPITAL_COMMUNITY): Payer: Self-pay

## 2020-04-22 ENCOUNTER — Other Ambulatory Visit (HOSPITAL_COMMUNITY): Payer: Self-pay

## 2020-04-22 ENCOUNTER — Encounter: Payer: Self-pay | Admitting: Family Medicine

## 2020-04-22 DIAGNOSIS — I5022 Chronic systolic (congestive) heart failure: Secondary | ICD-10-CM

## 2020-04-23 ENCOUNTER — Ambulatory Visit (HOSPITAL_COMMUNITY)
Admission: RE | Admit: 2020-04-23 | Discharge: 2020-04-23 | Disposition: A | Discharge status: Home / Self Care | Payer: 59 | Source: Ambulatory Visit | Attending: Internal Medicine | Admitting: Internal Medicine

## 2020-04-23 ENCOUNTER — Other Ambulatory Visit: Payer: Self-pay

## 2020-04-23 DIAGNOSIS — I5022 Chronic systolic (congestive) heart failure: Secondary | ICD-10-CM | POA: Insufficient documentation

## 2020-04-23 LAB — BASIC METABOLIC PANEL
Anion gap: 10 (ref 5–15)
BUN: 9 mg/dL (ref 6–20)
CO2: 22 mmol/L (ref 22–32)
Calcium: 9.2 mg/dL (ref 8.9–10.3)
Chloride: 105 mmol/L (ref 98–111)
Creatinine, Ser: 0.75 mg/dL (ref 0.44–1.00)
GFR, Estimated: >60 mL/min (ref >60)
Glucose, Bld: 90 mg/dL (ref 70–99)
Potassium: 4 mmol/L (ref 3.5–5.1)
Sodium: 137 mmol/L (ref 135–145)

## 2020-04-24 ENCOUNTER — Encounter: Payer: Self-pay | Admitting: Family Medicine

## 2020-04-24 ENCOUNTER — Other Ambulatory Visit: Payer: Self-pay

## 2020-04-24 ENCOUNTER — Ambulatory Visit: Payer: 59 | Admitting: Family Medicine

## 2020-04-24 VITALS — BP 92/60 | HR 76 | Temp 98.1°F | Ht 68.0 in | Wt 180.6 lb

## 2020-04-24 DIAGNOSIS — Z23 Encounter for immunization: Secondary | ICD-10-CM

## 2020-04-24 DIAGNOSIS — I152 Hypertension secondary to endocrine disorders: Secondary | ICD-10-CM

## 2020-04-24 DIAGNOSIS — E119 Type 2 diabetes mellitus without complications: Secondary | ICD-10-CM | POA: Diagnosis not present

## 2020-04-24 DIAGNOSIS — M545 Low back pain, unspecified: Secondary | ICD-10-CM | POA: Diagnosis not present

## 2020-04-24 DIAGNOSIS — E1159 Type 2 diabetes mellitus with other circulatory complications: Secondary | ICD-10-CM

## 2020-04-24 MED ORDER — DICLOFENAC SODIUM 75 MG PO TBEC: 75.0000 mg | DELAYED_RELEASE_TABLET | Freq: Two times a day (BID) | ORAL | 0 refills | Status: DC

## 2020-04-24 MED ORDER — KETOROLAC TROMETHAMINE 60 MG/2ML IM SOLN
60.0000 mg | Freq: Once | INTRAMUSCULAR | Status: AC
  Administered 2020-04-24: 60 mg via INTRAMUSCULAR

## 2020-04-24 MED ORDER — CYCLOBENZAPRINE HCL 10 MG PO TABS: 10.0000 mg | ORAL_TABLET | Freq: Three times a day (TID) | ORAL | 0 refills | Status: DC | PRN

## 2020-04-24 NOTE — Assessment & Plan Note (Signed)
At goal on metoprolol succinate 50 mg daily. ?

## 2020-04-24 NOTE — Assessment & Plan Note (Signed)
Needs repeat A1c next blood draw.

## 2020-04-24 NOTE — Progress Notes (Signed)
   Savannah Morales is a 54 y.o. female who presents today for an office visit.  Assessment/Plan:  New/Acute Problems: Low back pain No red flags.  Consistent with muscular strain.  Will give 60 mg of Toradol today.  Start diclofenac and Flexeril.  Discussed home exercises and handout was given.  Can continue using heating pad as well.  Discussed reasons to return to care.  Chronic Problems Addressed Today: Type 2 diabetes mellitus (HCC) Needs repeat A1c next blood draw.  Hypertension associated with diabetes (HCC) At goal on metoprolol succinate 50 mg daily.  Flu vaccine given today.     Subjective:  HPI:  Patient here for low back pain.  Started 5 days ago.  Initially located in the left lower back and radiated into her left thigh.  Over the last 2 days has started involving right lower back more.  No weakness or numbness.  No reported bowel or bladder incontinence.  She has tried taking Tylenol which is helped a little bit.  She is also used heating pad.  She had symptoms similar about a year ago that resolved with medications.       Objective:  Physical Exam: BP 92/60   Pulse 76   Temp 98.1 F (36.7 C) (Temporal)   Ht 5\' 8"  (1.727 m)   Wt 180 lb 9.6 oz (81.9 kg)   SpO2 100%   BMI 27.46 kg/m   Gen: No acute distress, resting comfortably CV: Regular rate and rhythm with no murmurs appreciated Pulm: Normal work of breathing, clear to auscultation bilaterally with no crackles, wheezes, or rhonchi MSK: Low back without deformities.  Tender to palpation in right lower lumbar paraspinal muscles.  Legs with normal strength bilaterally.  Neurovascular intact distally.  Straight leg raise negative bilaterally. Neuro: Grossly normal, moves all extremities Psych: Normal affect and thought content      Sair Faulcon M. , MD 04/24/2020 11:56 AM

## 2020-04-24 NOTE — Patient Instructions (Signed)
It was very nice to see you today!  You have a muscle strain.  Please work on the exercises.  We will give you an injection of a medication called Toradol today.  Please try taking the oral anti-inflammatories and working on the exercises  We will give you a flu vaccine today.  For me know if not proving by next week.  Take care, Dr [name]  Please try these tips to maintain a healthy lifestyle:   Eat at least 3 REAL meals and 1-2 snacks per day.  Aim for no more than 5 hours between eating.  If you eat breakfast, please do so within one hour of getting up.    Each meal should contain half fruits/vegetables, one quarter protein, and one quarter carbs (no bigger than a computer mouse)   Cut down on sweet beverages. This includes juice, soda, and sweet tea.     Drink at least 1 glass of water with each meal and aim for at least 8 glasses per day   Exercise at least 150 minutes every week.

## 2020-04-30 NOTE — Progress Notes (Signed)
[facility] CL[facility] NOTE  PCP: Ardith Dark, MD Primary Cardiologist: Dr. Flora Lipps  HPI:  Savannah Morales is a 55 y.o. female with a hx of DM, HTN and systolic HF due to NICM.   Says she was doing fine until she got the COVID vaccine [employer]) in 5/21. Admitted 12/24/19 with worsening SOB and cough. Echo showed EF 35%. Diuresed and GDMT started. No coronary calcium and no CP so cath not felt warranted,   cMRI 9/21 - Severely dilated LV EF 21% with normal wall thickness. Diffuse - RVEF 26%. - Severe left atrial enlargement, moderate right atrial enlargement. - Moderate to severe central mitral regurgitation with regurgitant fraction 48%. 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 Lalla Brothers   I saw her on 02/22/20 for the first time and was very tenuous. Multiple meds switched.   R/L cath  02/27/20 EF 20-25%. normal cors with well compensated hemodynamics  Ao = 91/65 (79) LV = 95/5 RA = 2 RV = 35/4 PA = 34/14 (21) PCW = 5 Fick cardiac output/index = 6.2/3.1 Thermo CO/CI =5.2/2.6 PVR =2.6 SVR = 952  FA sat = 97% PA sat = 72%  Zio 11/21  NSR. 8 episodes of NSVT, longest lasting 12.7 seconds at rate of 128bpm. Frequent PVC with burden of 13%.  Zio 12/21  NSR avg 83 bpm. No VT. PVC burden 7.3% with at least 3 morphologies of PVCs.  The most dominant PVC had a 5.8% burden.  She is here for f/u. Has seen Dr. Lalla Brothers and Dr. Flora Lipps. Symptoms much improved. Repeat Zio monitor showed decreasing PVC burden with medical therapy. Can walk dog around building ~15 minutes and does not get winded. No CP, increasing SOB, dizziness, palpitations or PND/dyspnea. Taking all medications. Taking lasix every other day.    ROS: All systems negative except as listed in HPI, PMH and Problem List.  SH:  Social History   Socioeconomic History  . Marital status: Divorced    Spouse name: Not on file  . Number of children: 3  . Years of  education: 76  . Highest education level: Not on file  Occupational History  . Occupation: Actor   Tobacco Use  . Smoking status: Never Smoker  . Smokeless tobacco: Never Used  Vaping Use  . Vaping Use: Never used  Substance and Sexual Activity  . Alcohol use: Not Currently    Comment: Rarely  . Drug use: No  . Sexual activity: Yes  Other Topics Concern  . Not on file  Social History Narrative  . Not on file   Social Determinants of Health   Financial Resource Strain: Not on file  Food Insecurity: No Food Insecurity  . Worried About Programme researcher, broadcasting/film/video in the Last Year: Never true  . Ran Out of Food in the Last Year: Never true  Transportation Needs: No Transportation Needs  . Lack of Transportation (Medical): No  . Lack of Transportation (Non-Medical): No  Physical Activity: Not on file  Stress: Not on file  Social Connections: Not on file  Intimate Partner Violence: Not on file    FH:  Family History  Problem Relation Age of Onset  . Hyperlipidemia Mother   . Hypertension Father   . Diabetes Father   . Cancer Paternal Grandmother     Past Medical History:  Diagnosis Date  . CHF (congestive heart failure) (HCC)   . Diabetes mellitus without  complication (HCC)    Type II  . History of chicken pox   . Hypertension   . Kidney stones     Current Outpatient Medications  Medication Sig Dispense Refill  . benzoyl peroxide-erythromycin (BENZAMYCIN) gel Apply 1 application topically daily.     . cyclobenzaprine (FLEXERIL) 10 MG tablet Take 1 tablet (10 mg total) by mouth 3 (three) times daily as needed for muscle spasms. 30 tablet 0  . diclofenac (VOLTAREN) 75 MG EC tablet Take 1 tablet (75 mg total) by mouth 2 (two) times daily. 30 tablet 0  . digoxin (LANOXIN) 0.125 MG tablet Take 1 tablet (0.125 mg total) by mouth daily. 30 tablet 3  . ELDERBERRY PO Take 2 tablets by mouth daily.     . empagliflozin (JARDIANCE) 10 MG TABS tablet Take 1  tablet (10 mg total) by mouth daily before breakfast. 30 tablet 11  . furosemide (LASIX) 40 MG tablet Take 1 tablet (40 mg total) by mouth every other day. 90 tablet 1  . Garlic 1000 MG CAPS Take 1,000 mg by mouth daily.     . metFORMIN (GLUCOPHAGE-XR) 500 MG 24 hr tablet TAKE 1 TABLET BY MOUTH DAILY WITH BREAKFAST 60 tablet 2  . metoprolol succinate (TOPROL-XL) 50 MG 24 hr tablet Take 1 tablet (50 mg total) by mouth daily. Take with or immediately following a meal. 30 tablet 3  . Omega-3 Fatty Acids (FISH OIL) 1200 MG CPDR Take 2,400 capsules by mouth daily.     . Potassium Chloride ER 20 MEQ TBCR Take 20 mEq by mouth every other day. 90 tablet 1  . sacubitril-valsartan (ENTRESTO) 24-26 MG Take 1 tablet by mouth 2 (two) times daily. 60 tablet 3  . spironolactone (ALDACTONE) 25 MG tablet Take 1 tablet (25 mg total) by mouth daily. 90 tablet 3   No current facility-administered medications for this encounter.    Vitals:   05/01/20 1211  BP: 110/80  Pulse: 85  SpO2: 99%  Weight: 82.4 kg   Wt Readings from Last 3 Encounters:  05/01/20 82.4 kg  04/24/20 81.9 kg  04/11/20 83.2 kg    PHYSICAL EXAM: General:  Well appearing. No resp difficulty HEENT: normal Neck: supple. no JVD. Carotids 2+ bilat; no bruits. No lymphadenopathy or thryomegaly appreciated. Cor: PMI nondisplaced. Regular rate & rhythm. No rubs, gallops or murmurs. Lungs: clear Abdomen: soft, nontender, nondistended. No hepatosplenomegaly. No bruits or masses. Good bowel sounds. Extremities: no cyanosis, clubbing, rash, edema Neuro: alert & oriented x 3, cranial nerves grossly intact. moves all 4 extremities w/o difficulty. Affect pleasant.   ASSESSMENT & PLAN:  1. Acute combined systolic and diastolic CHF: - Diagnosed 8/21 EF 30-35% - cMRI 9/21 LVEF 21% RV EF 26% mod-sev MR. No LGE - R/L cath  02/27/20 EF 20-25%. normal cors with well compensated hemodynamics - Felt to have PVC CM - s/p PVC ablation 01/31/20. Zio 11/21  13% PVC. Zio 12/21 7.3% PVCs - Improved NYHA II, Volume status ok. - Increase Entresto to 49/51 bid. - Continue lasix 40 mg daily. OK to take extra prn. - Continue Jardiance 10 mg daily. - Continue Toprol  50 daily. - Continue digoxin 0.125. - Continue spiro 25 daily. - As above, PVC cardiomyopathy seems to be the smoking gun here but no improvement since ablation. Repeat Zio patch shows PVC burden 7.3%, with most dominant PVC had 5.8% - BMET, digoxin level today. - Repeat echo in 1-2 months, if EF<30%, repeat ZIO. If ZIO shows >5% PVC  burden, will consider AAD for PVC suppression to see if EF will improve  2.  Frequent PVCs: -  s/p PVC ablation 01/31/20 with Dr. Quentin Ore - Zio 11/21  NSR. 8 episodes of NSVT, longest lasting 12.7 seconds at rate of 128bpm. Frequent PVC with burden of 13%. - Zio 12/21  NSR avg 83 bpm. No VT. PVC burden 7.3% with at least 3 morphologies of PVCs.  The most dominant PVC had a 5.8% burden. - has seen Dr. Quentin Ore and she is hesitant to start AAD or repeat ablation. Can reconsider if EF not improving. - repeat Echo as above.  Glori Bickers, MD 05/01/20 12:54 PM

## 2020-05-01 ENCOUNTER — Encounter (HOSPITAL_COMMUNITY): Payer: Self-pay | Admitting: Internal Medicine

## 2020-05-01 ENCOUNTER — Other Ambulatory Visit: Payer: Self-pay

## 2020-05-01 ENCOUNTER — Ambulatory Visit (HOSPITAL_COMMUNITY)
Admission: RE | Admit: 2020-05-01 | Discharge: 2020-05-01 | Disposition: A | Discharge status: Home / Self Care | Payer: 59 | Source: Ambulatory Visit | Attending: Internal Medicine | Admitting: Internal Medicine

## 2020-05-01 VITALS — BP 110/80 | HR 85 | Wt 181.6 lb

## 2020-05-01 DIAGNOSIS — I5022 Chronic systolic (congestive) heart failure: Secondary | ICD-10-CM

## 2020-05-01 DIAGNOSIS — I5041 Acute combined systolic (congestive) and diastolic (congestive) heart failure: Secondary | ICD-10-CM | POA: Diagnosis present

## 2020-05-01 DIAGNOSIS — Z791 Long term (current) use of non-steroidal anti-inflammatories (NSAID): Secondary | ICD-10-CM | POA: Diagnosis not present

## 2020-05-01 DIAGNOSIS — Z87442 Personal history of urinary calculi: Secondary | ICD-10-CM | POA: Diagnosis not present

## 2020-05-01 DIAGNOSIS — Z809 Family history of malignant neoplasm, unspecified: Secondary | ICD-10-CM | POA: Insufficient documentation

## 2020-05-01 DIAGNOSIS — E119 Type 2 diabetes mellitus without complications: Secondary | ICD-10-CM | POA: Diagnosis not present

## 2020-05-01 DIAGNOSIS — Z8249 Family history of ischemic heart disease and other diseases of the circulatory system: Secondary | ICD-10-CM | POA: Diagnosis not present

## 2020-05-01 DIAGNOSIS — I472 Ventricular tachycardia: Secondary | ICD-10-CM | POA: Diagnosis not present

## 2020-05-01 DIAGNOSIS — Z7984 Long term (current) use of oral hypoglycemic drugs: Secondary | ICD-10-CM | POA: Diagnosis not present

## 2020-05-01 DIAGNOSIS — Z79899 Other long term (current) drug therapy: Secondary | ICD-10-CM | POA: Diagnosis not present

## 2020-05-01 DIAGNOSIS — I110 Hypertensive heart disease with heart failure: Secondary | ICD-10-CM | POA: Diagnosis not present

## 2020-05-01 DIAGNOSIS — Z833 Family history of diabetes mellitus: Secondary | ICD-10-CM | POA: Insufficient documentation

## 2020-05-01 DIAGNOSIS — Z8349 Family history of other endocrine, nutritional and metabolic diseases: Secondary | ICD-10-CM | POA: Diagnosis not present

## 2020-05-01 DIAGNOSIS — I428 Other cardiomyopathies: Secondary | ICD-10-CM | POA: Diagnosis not present

## 2020-05-01 DIAGNOSIS — I340 Nonrheumatic mitral (valve) insufficiency: Secondary | ICD-10-CM | POA: Insufficient documentation

## 2020-05-01 DIAGNOSIS — I493 Ventricular premature depolarization: Secondary | ICD-10-CM

## 2020-05-01 HISTORY — DX: Heart failure, unspecified: I50.9

## 2020-05-01 LAB — BASIC METABOLIC PANEL
Anion gap: 9 (ref 5–15)
BUN: 13 mg/dL (ref 6–20)
CO2: 24 mmol/L (ref 22–32)
Calcium: 9.3 mg/dL (ref 8.9–10.3)
Chloride: 104 mmol/L (ref 98–111)
Creatinine, Ser: 0.68 mg/dL (ref 0.44–1.00)
GFR, Estimated: >60 mL/min (ref >60)
Glucose, Bld: 68 mg/dL — ABNORMAL LOW (ref 70–99)
Potassium: 4.5 mmol/L (ref 3.5–5.1)
Sodium: 137 mmol/L (ref 135–145)

## 2020-05-01 LAB — DIGOXIN LEVEL: Digoxin Level: 0.7 ng/mL — ABNORMAL LOW (ref 0.8–2.0)

## 2020-05-01 MED ORDER — ENTRESTO 49-51 MG PO TABS
1.0000 | ORAL_TABLET | Freq: Two times a day (BID) | ORAL | 5 refills | Status: DC
Start: 2020-05-01 — End: 2020-11-21

## 2020-05-01 NOTE — Patient Instructions (Signed)
Increase Entresto to 49/51 mg( 1 tablet) Twice daily   Labs done today, your results will be available in MyChart, we will contact you for abnormal readings.   Your physician has requested that you have an echocardiogram. Echocardiography is a painless test that uses sound waves to create images of your heart. It provides your doctor with information about the size and shape of your heart and how well your heart's chambers and valves are working. This procedure takes approximately one hour. There are no restrictions for this procedure.   Your physician recommends that you schedule a follow-up appointment in: 2-3 months  If you have any questions or concerns before your next appointment please send [place] a message through [place] or call our office at [phone].    TO LEAVE A MESSAGE FOR THE NURSE SELECT OPTION 2, PLEASE LEAVE A MESSAGE INCLUDING: . YOUR NAME . DATE OF BIRTH . CALL BACK NUMBER . REASON FOR CALL**this is important as we prioritize the call backs  YOU WILL RECEIVE A CALL BACK THE SAME DAY AS LONG AS YOU CALL BEFORE 4:00 PM  At the [facility], you and your health needs are our priority. As part of our continuing mission to provide you with exceptional heart care, we have created designated Provider Care Teams. These Care Teams include your primary Cardiologist (physician) and Advanced Practice Providers (APPs- Physician Assistants and Nurse Practitioners) who all work together to provide you with the care you need, when you need it.   You may see any of the following providers on your designated Care Team at your next follow up: [name] Dr Arvilla Meres . Dr Marca Ancona . Tonye Becket, NP . Robbie Lis, PA . Karle Plumber, PharmD   Please be sure to bring in all your medications bottles to every appointment.

## 2020-05-02 ENCOUNTER — Other Ambulatory Visit: Payer: Self-pay | Admitting: *Deleted

## 2020-05-02 NOTE — Patient Outreach (Signed)
[facility]) Care Management  05/02/2020  Savannah Morales Jun 17, 1965 [id number]   Telephone Assessment-Unsuccessful  RN attempted outreach call today however unsuccessful. RN able to leave a HIPAA approved voice message requesting a call back.  Will rescheduled another outreach call of inquire on pt's ongoing management of care.  Elliot Cousin, RN Care Management Coordinator [facility] Main Office [phone]

## 2020-05-07 ENCOUNTER — Ambulatory Visit: Payer: 59 | Admitting: Family Medicine

## 2020-05-07 ENCOUNTER — Other Ambulatory Visit: Payer: Self-pay

## 2020-05-07 ENCOUNTER — Encounter: Payer: Self-pay | Admitting: Family Medicine

## 2020-05-07 ENCOUNTER — Other Ambulatory Visit: Payer: Self-pay | Admitting: Cardiovascular Disease

## 2020-05-07 VITALS — BP 103/69 | HR 74 | Temp 97.0°F | Ht 68.0 in | Wt 179.6 lb

## 2020-05-07 DIAGNOSIS — E1159 Type 2 diabetes mellitus with other circulatory complications: Secondary | ICD-10-CM | POA: Diagnosis not present

## 2020-05-07 DIAGNOSIS — E119 Type 2 diabetes mellitus without complications: Secondary | ICD-10-CM | POA: Diagnosis not present

## 2020-05-07 DIAGNOSIS — I152 Hypertension secondary to endocrine disorders: Secondary | ICD-10-CM

## 2020-05-07 LAB — POCT GLYCOSYLATED HEMOGLOBIN (HGB A1C): Hemoglobin A1C: 6 % — AB (ref 4.0–5.6)

## 2020-05-07 NOTE — Patient Instructions (Signed)
It was very nice to see you today!  Your A1c is 6.0.  Please keep up the good work!  I will see you back in 6 months.  Please come back to see me sooner if needed.  Take care, Dr [name]  Please try these tips to maintain a healthy lifestyle:   Eat at least 3 REAL meals and 1-2 snacks per day.  Aim for no more than 5 hours between eating.  If you eat breakfast, please do so within one hour of getting up.    Each meal should contain half fruits/vegetables, one quarter protein, and one quarter carbs (no bigger than a computer mouse)   Cut down on sweet beverages. This includes juice, soda, and sweet tea.     Drink at least 1 glass of water with each meal and aim for at least 8 glasses per day   Exercise at least 150 minutes every week.

## 2020-05-07 NOTE — Assessment & Plan Note (Signed)
At goal on metoprolol succinate 50 mg daily.

## 2020-05-07 NOTE — Progress Notes (Signed)
   JADENCE KINLAW is a 55 y.o. female who presents today for an office visit.  Assessment/Plan:  Chronic Problems Addressed Today: Type 2 diabetes mellitus (HCC) A1c well controlled at 6.0.  Continue Jardiance 10 mg daily and metformin 500 mg daily.  Recheck in 6 months.  Continue lifestyle modifications.  Hypertension associated with diabetes (HCC) At goal on metoprolol succinate 50 mg daily.     Subjective:  HPI:  See A/p for status of chronic conditions.   She has seen a couple weeks ago for back pain.  Given diclofenac and Flexeril.  Also given Toradol injection.  Symptoms have resolved.       Objective:  Physical Exam: BP 103/69   Pulse 74   Temp (!) 97 F (36.1 C) (Temporal)   Ht 5\' 8"  (1.727 m)   Wt 179 lb 9.6 oz (81.5 kg)   BMI 27.31 kg/m   Gen: No acute distress, resting comfortably Neuro: Grossly normal, moves all extremities Psych: Normal affect and thought content      Jamin Humphries M. , MD 05/07/2020 9:15 AM

## 2020-05-07 NOTE — Assessment & Plan Note (Signed)
A1c well controlled at 6.0.  Continue Jardiance 10 mg daily and metformin 500 mg daily.  Recheck in 6 months.  Continue lifestyle modifications.

## 2020-05-08 ENCOUNTER — Other Ambulatory Visit: Payer: Self-pay | Admitting: *Deleted

## 2020-05-08 NOTE — Patient Outreach (Signed)
[facility]) Care Management  05/08/2020  MAMIE HUNDERTMARK 16-Feb-1966 [id number]   Telephone Assessment-Unsuccessful  RN attempted outreach call however unsuccessful. RN able to leave a HIPAA approved voice message requesting a call back.  Will rescheduled another outreach call over the next week.  Elliot Cousin, RN Care Management Coordinator [facility] Main Office [phone]

## 2020-05-14 ENCOUNTER — Other Ambulatory Visit: Payer: Self-pay | Admitting: *Deleted

## 2020-05-14 NOTE — Patient Outreach (Addendum)
[facility]) Care Management  05/14/2020  Savannah Morales 1965-07-14 [id number]   Telephone Assessment-Unsuccessful-HF  RN attempted 3rd outreach however unsuccessful. RN able to leave a HIPAA approved voice message requesting a call back. Will further engage at that time. Note pt works same business hours and very difficult to reach however verified attendance to her medical appointments 04/22/20 and 05/01/2020 via HF clinic.  Will scheduled another outreach over the next month for ongoing [facility] services.  Elliot Cousin, RN Care Management Coordinator [facility] Main Office [phone]

## 2020-05-17 ENCOUNTER — Encounter: Payer: Self-pay | Admitting: Family Medicine

## 2020-05-17 ENCOUNTER — Other Ambulatory Visit: Payer: Self-pay | Admitting: *Deleted

## 2020-05-17 DIAGNOSIS — M545 Low back pain, unspecified: Secondary | ICD-10-CM

## 2020-05-17 NOTE — Telephone Encounter (Signed)
Referral placed.

## 2020-05-31 ENCOUNTER — Encounter (HOSPITAL_COMMUNITY): Payer: Self-pay

## 2020-05-31 NOTE — Progress Notes (Signed)
Subjective:    I'm seeing this patient as a consultation for:  Dr. [name]. Note will be routed back to referring provider/PCP.  CC: Low back pain  I, Bejamin Hackbart, LAT, ATC, am serving as scribe for Dr. Clementeen Graham.  HPI: Pt is a 55 y/o female presenting w/ c/o LBP since late Dec 2021.  She was seen by her PCP on 04/24/20 and had a Toradol IM injection and was prescribed Flexeril and oral Voltaren.  These things helped until mid-January when the pain returned.  She locates her pain to her L lower back w/ some intermittent radiating pain into her left anterior thigh. No groin pain.  Radiating pain: yes into her left anterior thigh LE numbness/tingling: No LE weakness: No Aggravating factors: Nothing in particular Treatments tried:Tylenol; heat  Past medical history, Surgical history, Family history, Social history, Allergies, and medications have been entered into the medical record, reviewed.   Review of Systems: No new headache, visual changes, nausea, vomiting, diarrhea, constipation, dizziness, abdominal pain, skin rash, fevers, chills, night sweats, weight loss, swollen lymph nodes, body aches, joint swelling, muscle aches, chest pain, shortness of breath, mood changes, visual or auditory hallucinations.   Objective:    Vitals:   06/03/20 0930  BP: 104/72   General: Well Developed, well nourished, and in no acute distress.  Neuro/Psych: Alert and oriented x3, extra-ocular muscles intact, able to move all 4 extremities, sensation grossly intact. Skin: Warm and dry, no rashes noted.  Respiratory: Not using accessory muscles, speaking in full sentences, trachea midline.  Cardiovascular: Pulses palpable, no extremity edema. Abdomen: Does not appear distended. MSK: L-spine normal-appearing Nontender midline. Tender palpation and rigid paraspinal musculature left paraspinal muscles. Decreased lumbar motion to rotation and extension. Lower extremity strength is intact bilateral  lower extremities. Reflexes and sensation are equal and normal throughout.  Left hip normal-appearing nontender normal motion no pain with flexion and internal rotation.  Lab and Radiology Results X-ray images L-spine obtained today personally and independently interpreted  Anterior listhesis L4-L5 without evidence of spondylolisthesis. 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 No fracture or severe deformity visible. Await formal radiology review  Impression and Recommendations:    Assessment and Plan: 55 y.o. female with left-sided low back pain with some left anterior thigh pain. Symptoms have been ongoing for several months now failing to improve with typical conservative management including medications and time. Plan for dedicated physical therapy. Patient may have a radicular component at L3 on the left and will try gabapentin for that. However main intervention will be physical therapy.  Recheck in 6 to 8 weeks. Return sooner if needed.[name]  PDMP not reviewed this encounter. Orders Placed This Encounter  Procedures  . DG Lumbar Spine 2-3 Views    Standing Status:   Future    Number of Occurrences:   1    Standing Expiration Date:   07/01/2020    Order Specific Question:   Reason for Exam (SYMPTOM  OR DIAGNOSIS REQUIRED)    Answer:   Low back pain    Order Specific Question:   Is patient pregnant?    Answer:   No    Order Specific Question:   Preferred imaging location?    Answer:   [facility]  . Ambulatory referral to Physical Therapy    Referral Priority:   Routine    Referral Type:   Physical Medicine    Referral Reason:   Specialty Services Required    Requested Specialty:  Physical Therapy   Meds ordered this encounter  Medications  . gabapentin (NEURONTIN) 300 MG capsule    Sig: Take 1 capsule (300 mg total) by mouth 3 (three) times daily as needed.    Dispense:  90 capsule    Refill:  3    Discussed warning signs or symptoms. Please see discharge  instructions. Patient expresses understanding.   The above documentation has been reviewed and is accurate and complete Clementeen Graham, M.D.

## 2020-06-03 ENCOUNTER — Encounter: Payer: Self-pay | Admitting: Family Medicine

## 2020-06-03 ENCOUNTER — Ambulatory Visit (INDEPENDENT_AMBULATORY_CARE_PROVIDER_SITE_OTHER): Payer: 59 | Admitting: Family Medicine

## 2020-06-03 ENCOUNTER — Ambulatory Visit (INDEPENDENT_AMBULATORY_CARE_PROVIDER_SITE_OTHER): Payer: 59

## 2020-06-03 ENCOUNTER — Other Ambulatory Visit: Payer: Self-pay

## 2020-06-03 VITALS — BP 104/72 | Ht 68.0 in | Wt 178.2 lb

## 2020-06-03 DIAGNOSIS — M5441 Lumbago with sciatica, right side: Secondary | ICD-10-CM

## 2020-06-03 DIAGNOSIS — G8929 Other chronic pain: Secondary | ICD-10-CM | POA: Diagnosis not present

## 2020-06-03 DIAGNOSIS — M5442 Lumbago with sciatica, left side: Secondary | ICD-10-CM | POA: Diagnosis not present

## 2020-06-03 MED ORDER — GABAPENTIN 300 MG PO CAPS: 300.0000 mg | ORAL_CAPSULE | Freq: Three times a day (TID) | ORAL | 3 refills | Status: DC | PRN

## 2020-06-03 NOTE — Progress Notes (Signed)
X-ray lumbar spine shows some arthritis present in the low back.  If not better with physical therapy MRI may be helpful here.

## 2020-06-03 NOTE — Patient Instructions (Addendum)
Thank you for coming in today.  Please get an Xray today before you leave  I've referred you to Physical Therapy.  Let us know if you don't hear from them in one week.  Try gabapentin mostly at bedtime for nerve pain in the thigh as needed.   Recheck with me in 6-8 weeks.   Let me know if this is not working.  We can change the plan.    Lumbosacral Strain Lumbosacral strain is an injury that causes pain in the lower back (lumbosacral spine). This injury usually happens from overstretching the muscles or ligaments along your spine. Ligaments are cord-like tissues that connect bones to other bones. A strain can affect one or more muscles or ligaments. What are the causes? This condition may be caused by:  A hard, direct hit to the back.  Overstretching the lower back muscles. This may result from: ? A fall. ? Lifting something heavy. ? Repetitive movements such as bending or crouching. What increases the risk? The following factors may make you more likely to develop this condition:  Participating in sports or activities that involve: ? A sudden twist of the back. ? Pushing or pulling motions.  Being overweight or obese.  Having poor strength and flexibility, especially tight hamstrings or weak muscles in the back or abdomen.  Having too much of a curve in the lower back.  Having a pelvis that is tilted forward. What are the signs or symptoms? The main symptom of this condition is pain in the lower back, at the site of the strain. Pain may also be felt down one or both legs. How is this diagnosed? This condition is diagnosed based on your symptoms, your medical history, and a physical exam. During the physical exam, your health care provider may push on certain areas of your back to find the source of your pain. You may be asked to bend forward, backward, and side to side to check your pain and range of motion. You may also have imaging tests, such as X-rays and an MRI. How  is this treated? This condition may be treated by:  Applying heat and cold on the affected area.  Taking medicines to help relieve pain and relax your muscles.  Taking NSAIDs, such as ibuprofen, to help reduce swelling and discomfort.  Doing stretching and strengthening exercises for your lower back. Symptoms usually improve within several weeks of treatment. However, recovery time varies. When your symptoms improve, gradually return to your normal routine as soon as possible to reduce pain, avoid stiffness, and keep muscle strength. Follow these instructions at home: Medicines  Take over-the-counter and prescription medicines only as told by your health care provider.  Ask your health care provider if the medicine prescribed to you: ? Requires you to avoid driving or using heavy machinery. ? Can cause constipation. You may need to take these actions to prevent or treat constipation:  Drink enough fluid to keep your urine pale yellow.  Take over-the-counter or prescription medicines.  Eat foods that are high in fiber, such as beans, whole grains, and fresh fruits and vegetables.  Limit foods that are high in fat and processed sugars, such as fried or sweet foods. Managing pain, stiffness, and swelling  If directed, put ice on the injured area. To do this: ? Put ice in a plastic bag. ? Place a towel between your skin and the bag. ? Leave the ice on for 20 minutes, 2-3 times a day.  If directed, apply  heat on the affected area as often as told by your health care provider. Use the heat source that your health care provider recommends, such as a moist heat pack or a heating pad. ? Place a towel between your skin and the heat source. ? Leave the heat on for 20-30 minutes. ? Remove the heat if your skin turns bright red. This is especially important if you are unable to feel pain, heat, or cold. You may have a greater risk of getting burned.      Activity  Rest as told by your  health care provider.  Do not stay in bed. Staying in bed for more than 1-2 days can delay your recovery.  Return to your normal activities as told by your health care provider. Ask your health care provider what activities are safe for you.  Avoid activities that take a lot of energy for as long as told by your health care provider.  Do exercises as told by your health care provider. This includes stretching and strengthening exercises. General instructions  Sit up and stand up straight. Avoid leaning forward when you sit, or hunching over when you stand.  Do not use any products that contain nicotine or tobacco, such as cigarettes, e-cigarettes, and chewing tobacco. If you need help quitting, ask your health care provider.  Keep all follow-up visits as told by your health care provider. This is important. How is this prevented?  Use correct form when playing sports and lifting heavy objects.  Use good posture when sitting and standing.  Maintain a healthy weight.  Sleep on a mattress with medium firmness to support your back.  Do at least 150 minutes of moderate-intensity exercise each week, such as brisk walking or water aerobics. Try a form of exercise that takes stress off your back, such as swimming or stationary cycling.  Maintain physical fitness, including: ? Strength. ? Flexibility.   Contact a health care provider if:  Your back pain does not improve after several weeks of treatment.  Your symptoms get worse. Get help right away if:  Your back pain is severe.  You cannot stand or walk.  You have difficulty controlling when you urinate or when you have a bowel movement.  You feel nauseous or you vomit.  Your feet or legs get very cold, turn pale, or look blue.  You have numbness, tingling, weakness, or problems using your arms or legs.  You develop any of the following: ? Shortness of breath. ? Dizziness. ? Pain in your legs. ? Weakness in your buttocks  or legs. Summary  Lumbosacral strain is an injury that causes pain in the lower back (lumbosacral spine).  This injury usually happens from overstretching the muscles or ligaments along your spine.  This condition may be caused by a direct hit to the lower back or by overstretching the lower back muscles.  Symptoms usually improve within several weeks of treatment. This information is not intended to replace advice given to you by your health care provider. Make sure you discuss any questions you have with your health care provider. Document Revised: 09/06/2018 Document Reviewed: 09/06/2018 Elsevier Patient Education  2021 [employer].

## 2020-06-04 ENCOUNTER — Other Ambulatory Visit: Payer: Self-pay

## 2020-06-04 ENCOUNTER — Ambulatory Visit (HOSPITAL_COMMUNITY)
Admission: RE | Admit: 2020-06-04 | Discharge: 2020-06-04 | Disposition: A | Discharge status: Home / Self Care | Payer: 59 | Source: Ambulatory Visit | Attending: Internal Medicine | Admitting: Internal Medicine

## 2020-06-04 DIAGNOSIS — I509 Heart failure, unspecified: Secondary | ICD-10-CM | POA: Diagnosis not present

## 2020-06-04 DIAGNOSIS — I5022 Chronic systolic (congestive) heart failure: Secondary | ICD-10-CM

## 2020-06-04 DIAGNOSIS — I110 Hypertensive heart disease with heart failure: Secondary | ICD-10-CM | POA: Diagnosis not present

## 2020-06-04 DIAGNOSIS — E119 Type 2 diabetes mellitus without complications: Secondary | ICD-10-CM | POA: Insufficient documentation

## 2020-06-04 LAB — ECHOCARDIOGRAM COMPLETE
Calc EF: 42.8 %
S' Lateral: 5 cm
Single Plane A2C EF: 43.2 %
Single Plane A4C EF: 44.1 %

## 2020-06-04 NOTE — Progress Notes (Signed)
  Echocardiogram 2D Echocardiogram has been performed.  Delcie Roch 06/04/2020, 11:51 AM

## 2020-06-06 ENCOUNTER — Encounter: Payer: Self-pay | Admitting: Family Medicine

## 2020-06-07 MED ORDER — PREGABALIN 75 MG PO CAPS: 75.0000 mg | ORAL_CAPSULE | Freq: Two times a day (BID) | ORAL | 3 refills | Status: DC | PRN

## 2020-06-07 NOTE — Telephone Encounter (Signed)
Stop gabapentin.  I have prescribed Lyrica.  That may be effective and better tolerated.

## 2020-06-11 ENCOUNTER — Encounter: Payer: Self-pay | Admitting: Cardiology

## 2020-06-11 ENCOUNTER — Ambulatory Visit (INDEPENDENT_AMBULATORY_CARE_PROVIDER_SITE_OTHER): Payer: 59 | Admitting: Cardiology

## 2020-06-11 ENCOUNTER — Other Ambulatory Visit: Payer: Self-pay

## 2020-06-11 VITALS — BP 110/62 | HR 76 | Ht 68.0 in | Wt 178.0 lb

## 2020-06-11 DIAGNOSIS — I5022 Chronic systolic (congestive) heart failure: Secondary | ICD-10-CM | POA: Diagnosis not present

## 2020-06-11 DIAGNOSIS — I493 Ventricular premature depolarization: Secondary | ICD-10-CM

## 2020-06-11 NOTE — Patient Instructions (Signed)
Medication Instructions:  Your physician recommends that you continue on your current medications as directed. Please refer to the Current Medication list given to you today.  Labwork: None ordered.  Testing/Procedures: None ordered.  Follow-Up: Your physician wants you to follow-up in: 6 months with Dr. Lambert.   You will receive a reminder letter in the mail two months in advance. If you don't receive a letter, please call our office to schedule the follow-up appointment.   Any Other Special Instructions Will Be Listed Below (If Applicable).  If you need a refill on your cardiac medications before your next appointment, please call your pharmacy.   

## 2020-06-11 NOTE — Progress Notes (Signed)
Electrophysiology Office Follow up Visit Note:    Date:  06/11/2020   ID:  Savannah Morales, DOB 1965/08/28, MRN [id number]  PCP:  Ardith Dark, MD  [facility] Cardiologist:  Reatha Harps, MD  [facility] Electrophysiologist:  Lanier Prude, MD    Interval History:    Savannah Morales is a 55 y.o. female who presents for a follow up visit.  I last saw her March 08, 2020.  After that appointment I ordered a 3-day ZIO monitor to reassess her PVC burden.  Her PVC burden returned to 7.3% with at least 3 different morphologies of PVCs.  The most dominant PVC morphology had a 5.8% burden.  Patient tells me she is doing well overall.  No significant limitations in her activity level.  Her weights are stable.  She is tolerating her medical therapy well.  No syncope or presyncope.     Past Medical History:  Diagnosis Date  . CHF (congestive heart failure) (HCC)   . Diabetes mellitus without complication (HCC)    Type II  . History of chicken pox   . Hypertension   . Kidney stones     Past Surgical History:  Procedure Laterality Date  . ANKLE SURGERY    . PVC ABLATION N/A 01/31/2020   Procedure: PVC ABLATION;  Surgeon: Lanier Prude, MD;  Location: [facility] INVASIVE CV LAB;  Service: Cardiovascular;  Laterality: N/A;  . RIGHT/LEFT HEART CATH AND CORONARY ANGIOGRAPHY N/A 02/27/2020   Procedure: RIGHT/LEFT HEART CATH AND CORONARY ANGIOGRAPHY;  Surgeon: Dolores Patty, MD;  Location: [facility] INVASIVE CV LAB;  Service: Cardiovascular;  Laterality: N/A;    Current Medications: Current Meds  Medication Sig  . benzoyl peroxide-erythromycin (BENZAMYCIN) gel Apply 1 application topically daily.   . digoxin (LANOXIN) 0.125 MG tablet Take 1 tablet (0.125 mg total) by mouth daily.  [name] ELDERBERRY PO Take 2 tablets by mouth daily.   . empagliflozin (JARDIANCE) 10 MG TABS tablet Take 1 tablet (10 mg total) by mouth daily before breakfast.  . Flax Oil-Fish Oil-Borage Oil (FISH  OIL-FLAX OIL-BORAGE OIL PO) Take by mouth.  . furosemide (LASIX) 40 MG tablet Take 1 tablet (40 mg total) by mouth every other day.  . Garlic 1000 MG CAPS Take 1,000 mg by mouth daily.   . metFORMIN (GLUCOPHAGE-XR) 500 MG 24 hr tablet TAKE 1 TABLET BY MOUTH DAILY WITH BREAKFAST  . metoprolol succinate (TOPROL-XL) 50 MG 24 hr tablet TAKE 2 TABLETS(100 MG) BY MOUTH DAILY WITH OR IMMEDIATELY FOLLOWING A MEAL  . Multiple Vitamin (MULTIVITAMIN) capsule Take 1 capsule by mouth daily.  . Omega-3 Fatty Acids (FISH OIL) 1200 MG CPDR Take 2,400 capsules by mouth daily.   . Potassium Chloride ER 20 MEQ TBCR Take 20 mEq by mouth every other day.  . pregabalin (LYRICA) 75 MG capsule Take 1 capsule (75 mg total) by mouth 2 (two) times daily as needed.  . sacubitril-valsartan (ENTRESTO) 49-51 MG Take 1 tablet by mouth 2 (two) times daily.  [name] spironolactone (ALDACTONE) 25 MG tablet Take 1 tablet (25 mg total) by mouth daily.     Allergies:   Patient has no known allergies.   Social History   Socioeconomic History  . Marital status: Divorced    Spouse name: Not on file  . Number of children: 3  . Years of education: 55  . Highest education level: Not on file  Occupational History  . Occupation: Actor   Tobacco Use  .  Smoking status: Never Smoker  . Smokeless tobacco: Never Used  Vaping Use  . Vaping Use: Never used  Substance and Sexual Activity  . Alcohol use: Not Currently    Comment: Rarely  . Drug use: No  . Sexual activity: Yes  Other Topics Concern  . Not on file  Social History Narrative  . Not on file   Social Determinants of Health   Financial Resource Strain: Not on file  Food Insecurity: No Food Insecurity  . Worried About Programme researcher, broadcasting/film/video in the Last Year: Never true  . Ran Out of Food in the Last Year: Never true  Transportation Needs: No Transportation Needs  . Lack of Transportation (Medical): No  . Lack of Transportation (Non-Medical): No   Physical Activity: Not on file  Stress: Not on file  Social Connections: Not on file     Family History: The patient's family history includes Cancer in her paternal grandmother; Diabetes in her father; Hyperlipidemia in her mother; Hypertension in her father.  ROS:   Please see the history of present illness.    All other systems reviewed and are negative.  EKGs/Labs/Other Studies Reviewed:    The following studies were reviewed today:  June 04, 2020 echo personally reviewed Left ventricular function 35 to 40%, slight improvement from echo in August 2021. Right ventricular function normal No significant valvular abnormalities  March 29, 2020 3-day ZIO personally reviewed Heart rate 37 to 203 bpm, average 83 bpm. 5 episodes of NSVT, longest lasting 12 beats with an average rate of 161 bpm. No sustained ventricular arrhythmias. PVC burden 7.3% with at least 3 morphologies of PVCs.  The most dominant PVC had only a 5.8% burden.      EKG:  The ekg ordered today demonstrates sinus rhythm with rare PVCs on rhythm strip.  Recent Labs: 12/23/2019: ALT 49 12/25/2019: TSH 0.341 12/27/2019: Magnesium 1.9 02/22/2020: B Natriuretic Peptide 1,341.4; Platelets 221 02/27/2020: Hemoglobin 14.3 05/01/2020: BUN 13; Creatinine, Ser 0.68; Potassium 4.5; Sodium 137  Recent Lipid Panel    Component Value Date/Time   CHOL 160 12/25/2019 0807   TRIG 60 12/25/2019 0807   HDL 50 12/25/2019 0807   CHOLHDL 3.2 12/25/2019 0807   VLDL 12 12/25/2019 0807   LDLCALC 98 12/25/2019 0807    Physical Exam:    VS:  BP 110/62   Pulse 76   Ht 5\' 8"  (1.727 m)   Wt 178 lb (80.7 kg)   SpO2 97%   BMI 27.06 kg/m     Wt Readings from Last 3 Encounters:  06/11/20 178 lb (80.7 kg)  06/03/20 178 lb 3.2 oz (80.8 kg)  05/07/20 179 lb 9.6 oz (81.5 kg)     GEN:  Well nourished, well developed in no acute distress HEENT: Normal NECK: No JVD; No carotid bruits LYMPHATICS: No lymphadenopathy CARDIAC:  RRR, no murmurs, rubs, gallops RESPIRATORY:  Clear to auscultation without rales, wheezing or rhonchi  ABDOMEN: Soft, non-tender, non-distended MUSCULOSKELETAL:  No edema; No deformity  SKIN: Warm and dry NEUROLOGIC:  Alert and oriented x 3 PSYCHIATRIC:  Normal affect   ASSESSMENT:    1. PVC's (premature ventricular contractions)   2. Chronic systolic congestive heart failure (HCC)    PLAN:    In order of problems listed above:  1. PVC While the PVCs are still occurring, the frequency of her PVC is down to around 7%.  Review of previous EKGs prior to the ablation and starting optimal medical therapy showed very frequent  PVCs in the neighborhood of 30 to 40%.  There are 3 different morphologies observed on the recent ZIO monitor.  I do not think taking her back to the EP lab for a repeat ablation attempt is a good strategy at this point.  I would continue on optimal medical therapy guided by Dr. Gala Romney.  2.  Chronic combined systolic and diastolic heart failure Warm and dry on today's exam.  NYHA class I-II symptoms. Continue optimal medical therapy and follow-up with Dr. Gala Romney.  Follow-up 6 months.   Medication Adjustments/Labs and Tests Ordered: Current medicines are reviewed at length with the patient today.  Concerns regarding medicines are outlined above.  Orders Placed This Encounter  Procedures  . EKG 12-Lead   No orders of the defined types were placed in this encounter.    Signed, Steffanie Dunn, MD, [facility]  06/11/2020 11:21 AM    Electrophysiology [facility]

## 2020-06-13 ENCOUNTER — Other Ambulatory Visit: Payer: Self-pay | Admitting: *Deleted

## 2020-06-16 ENCOUNTER — Other Ambulatory Visit (HOSPITAL_COMMUNITY): Payer: Self-pay | Admitting: Internal Medicine

## 2020-06-17 ENCOUNTER — Other Ambulatory Visit: Payer: Self-pay

## 2020-06-17 ENCOUNTER — Ambulatory Visit (INDEPENDENT_AMBULATORY_CARE_PROVIDER_SITE_OTHER): Payer: 59 | Admitting: Physical Therapy

## 2020-06-17 ENCOUNTER — Encounter: Payer: Self-pay | Admitting: Physical Therapy

## 2020-06-17 DIAGNOSIS — M6281 Muscle weakness (generalized): Secondary | ICD-10-CM | POA: Diagnosis not present

## 2020-06-17 DIAGNOSIS — M545 Low back pain, unspecified: Secondary | ICD-10-CM

## 2020-06-17 NOTE — Patient Instructions (Signed)
Access Code: [email or web address] URL: [email or web address] Date: 06/17/2020 Prepared by: Zebedee Iba  Exercises Supine Posterior Pelvic Tilt - 2 x daily - 7 x weekly - 2 sets - 10 reps Supine Bridge - 1 x daily - 7 x weekly - 2 sets - 10 reps Seated Quadratus Lumborum Stretch with Arm Overhead - 2 x daily - 7 x weekly - 1 sets - 3 reps - 30 hold

## 2020-06-17 NOTE — Therapy (Signed)
[facility] [place] [facility] [address] [place], [place], [address] Phone: [phone]   Fax:  [phone]  Physical Therapy Evaluation  Patient Details  Name: Savannah Morales MRN: [id number] Date of Birth: [birth year] Referring Provider (PT): Dr. Denyse Amass   Encounter Date: 06/17/2020   PT End of Session - 06/17/20 1217    Visit Number 1    Number of Visits 17    Date for PT Re-Evaluation 07/17/20    Authorization Type [employer]    PT Start Time 0900    PT Stop Time 0950    PT Time Calculation (min) 50 min    Activity Tolerance Patient tolerated treatment well    Behavior During Therapy [facility] for tasks assessed/performed           Past Medical History:  Diagnosis Date  . CHF (congestive heart failure) (HCC)   . Diabetes mellitus without complication (HCC)    Type II  . History of chicken pox   . Hypertension   . Kidney stones     Past Surgical History:  Procedure Laterality Date  . ANKLE SURGERY    . PVC ABLATION N/A 01/31/2020   Procedure: PVC ABLATION;  Surgeon: Lanier Prude, MD;  Location: [facility] INVASIVE CV LAB;  Service: Cardiovascular;  Laterality: N/A;  . RIGHT/LEFT HEART CATH AND CORONARY ANGIOGRAPHY N/A 02/27/2020   Procedure: RIGHT/LEFT HEART CATH AND CORONARY ANGIOGRAPHY;  Surgeon: Dolores Patty, MD;  Location: [facility] INVASIVE CV LAB;  Service: Cardiovascular;  Laterality: N/A;    There were no vitals filed for this visit.    Subjective Assessment - 06/17/20 0905    Subjective Pt states she has been having L sided LBP with spasms and pain into the anterior thigh. Pt states the pain comes on about every day. Pt states the pain started back in December. The initial incident happened about a years ago. Pt states the pain starts with transitioning after laying down or sitting for a while. Pt states the "spasm" pain mostly happenes at night. The spasms will be so bad that she cannot move or get up. Pt denies NT. Pt's pain  does not cross the knee. Pt states she sits for work currently. Pt states she sits for about 8 hours a day. Pt states that heavy lifitng, bending, and twisting causes pain. Pt states she does walk her dog about 2x a day and that does not give her pain. She has to walk up gentle hills but no climbing up and down stairs. 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 Pt states she currently does not have an exercise routine but has thought about starting yoga with her son.    Limitations Standing;Walking;Sitting    How long can you sit comfortably? As long as she would like    How long can you stand comfortably? <1 hour    How long can you walk comfortably? 15 mins    Patient Stated Goals Pt states she would like to return to daily life without pain.    Currently in Pain? Yes    Pain Score 7     Pain Location Back    Pain Orientation Left    Pain Descriptors / Indicators Aching;Sharp;Shooting    Pain Type Acute pain;Chronic pain    Pain Radiating Towards Anterior thigh    Pain Onset More than a month ago    Pain Frequency Intermittent    Aggravating Factors  laying flat on back, lifting, bending, twisting    Pain Relieving Factors heating pad, pain medication    Effect of Pain on Daily Activities hesitant to perform daily activities, pain with house hold chores    Multiple Pain Sites No              [facility] PT Assessment - 06/17/20 0001      Assessment   Medical Diagnosis L sided LBP    Referring Provider (PT) Dr. Denyse Amass    Next MD Visit 06/2020    Prior Therapy Broken ankle      Precautions   Precautions None      Restrictions   Weight Bearing Restrictions No      Balance Screen   Has the patient fallen in the past 6 months No    Has the patient had a decrease in activity level because of a fear of falling?  No    Is the patient reluctant to leave their home because of a fear of falling?  No      Home Tourist information centre manager  residence      Prior Function   Level of Independence Independent      Cognition   Overall Cognitive Status Within Functional Limits for tasks assessed      Observation/Other Assessments   Other Surveys  Oswestry Disability Index    Oswestry Disability Index  24% impairment      Sensation   Light Touch Appears Intact      Functional Tests   Functional tests Squat;Sit to Stand      Squat   Comments quad dominant squat, decreased hip extension,      Sit to Stand   Comments L LE forward, offweighting, trunk lean away      Deep Tendon Reflexes   DTR Assessment Site --      ROM / Strength   AROM / PROM / Strength AROM;Strength      AROM   AROM Assessment Site Lumbar;Hip    Right/Left Hip Left    Left Hip External Rotation  40    Left Hip Internal Rotation  30   p!   Lumbar Flexion 60%   relieved pain   Lumbar Extension 10%    Lumbar - Right Side Bend 50%   relieved pain   Lumbar - Left Side Bend 30%    Lumbar - Right Rotation 50%    Lumbar - Left Rotation 50%      Strength   Overall Strength Deficits    Overall Strength Comments 4-/5 throughout hips, core instability with supine <> seated transfers      Flexibility   Soft Tissue Assessment /Muscle Length yes    Hamstrings significantly limited bilat    Quadriceps significantly limited, L>R    Quadratus Lumborum significantly limited bilat      Palpation   Spinal mobility Decreased extension mobility L1-5    Palpation comment TTP & hypertonicity of bilateral paraspinals, QL, L hip flexor and quads      Special Tests    Special Tests Lumbar;Sacrolliac Tests    Lumbar Tests FABER test;Slump Test;Straight Leg Raise    Sacroiliac Tests  Sacral Compression      FABER test   findings Positive    Side LEft      Slump test   Findings Negative      Straight Leg Raise   Findings Negative      Sacral Compression   Findings Negative  Transfers   Transfers Sit to Stand    Sit to Stand With upper extremity  assist      Ambulation/Gait   Gait Pattern Decreased step length - right;Decreased stance time - left;Decreased stride length;Decreased hip/knee flexion - left;Decreased hip/knee flexion - right;Decreased weight shift to right;Trendelenburg;Lateral trunk lean to right    Stairs Yes    Stair Management Technique Alternating pattern   decreased eccentric control with L                     Objective measurements completed on examination: See above findings.       [facility] Adult PT Treatment/Exercise - 06/17/20 0001      Exercises   Exercises Knee/Hip;Lumbar      Lumbar Exercises: Stretches   Other Lumbar Stretch Exercise seated QL 30s3x      Lumbar Exercises: Seated   Other Seated Lumbar Exercises seated tilts 10x      Lumbar Exercises: Supine   Pelvic Tilt 20 reps    Pelvic Tilt Limitations 2s    Bridge 20 reps      Manual Therapy   Manual Therapy Joint mobilization;Soft tissue mobilization    Joint Mobilization L1-5 grade II PA, prone    Soft tissue mobilization L L/S paraspinals, QL, deep lateral hip rotators                  PT Education - 06/17/20 1216    Education Details diagnosis, prognosis, POC, HEP, daily movement variablity, postural changes, safety with beginner yoga, increased physical activity    Person(s) Educated Patient    Methods Explanation;Demonstration;Handout    Comprehension Verbalized understanding;Returned demonstration            PT Short Term Goals - 06/17/20 1235      PT SHORT TERM GOAL #1   Title Pt will demonstrate independence with HEP in order to show synthesis of PT education and compliance.    Time 2    Period Days      PT SHORT TERM GOAL #2   Title Pt will demonstrate ability to tolerate end range L/S extension in standing in order to demonstrate functional improvement in lumbar ROM.    Time 4    Period Weeks    Status New             PT Long Term Goals - 06/17/20 1238      PT LONG TERM GOAL #1    Title Pt will demonstrate at least a 12.8 improvement in Oswestry Index in order to demonstrate a clinically significant change in LBP and function.    Time 6    Period Weeks    Status New      PT LONG TERM GOAL #2   Title Pt will be able to lift and squat at least 45 lbs in order to demonstrate functional improvement in LE and lumbopelvic motor control for full return to ADL and household duties.    Time 8    Period Weeks    Status New                  Plan - 06/17/20 1218    Clinical Impression Statement Pt is a 55 y.o female presenting to PT eval today with CC of LBP with L sided anterior hip pain. Pt presents with gait deviations, postural shifting, decreased L/S ROM, decreased hip ROM, hypertonicity/muscle spasm, and decreased lumbopelvic and LE strength. Pt's s/s are consistent with lumbar radiculopathy.  Pt responds well to flexion based movements.  Pt's current impairments limit her full participation in performing house hold duties/chores, caregiver responsibilites, ADL, and work related activity. Pt would benefit from continued skilled therapy in order to maximize functional mobility and lumbopelvic strength for pain free return to goals.    Personal Factors and Comorbidities Age;Comorbidity 1;Comorbidity 2;Behavior Pattern;Fitness    Examination-Activity Limitations Lift;Squat;Stand;Bed Mobility;Caring for Others    Examination-Participation Restrictions Yard Work;Cleaning;Laundry;Community Activity    Stability/Clinical Decision Making Evolving/Moderate complexity    Clinical Decision Making Moderate    Rehab Potential Good    PT Frequency 2x / week    PT Duration 8 weeks    PT Treatment/Interventions ADLs/Self Care Home Management;Aquatic Therapy;Biofeedback;Electrical Stimulation;Cryotherapy;Moist Heat;Iontophoresis 4mg /ml Dexamethasone;Traction;Stair training;Functional mobility training;Therapeutic activities;Therapeutic exercise;Balance training;Neuromuscular  re-education;Patient/family education;Manual techniques;Scar mobilization;Passive range of motion;Dry needling;Taping;Spinal Manipulations;Joint Manipulations    PT Next Visit Plan hip flexor stretch, bridge with band, STS, palloff press    Consulted and Agree with Plan of Care Patient           Patient will benefit from skilled therapeutic intervention in order to improve the following deficits and impairments:  Abnormal gait,Decreased endurance,Decreased mobility,Difficulty walking,Increased muscle spasms,Decreased range of motion,Decreased strength,Pain,Postural dysfunction  Visit Diagnosis: Low back pain, unspecified back pain laterality, unspecified chronicity, unspecified whether sciatica present  Muscle weakness (generalized)     Problem List Patient Active Problem List   Diagnosis Date Noted  . PVC's (premature ventricular contractions) 02/12/2020  . Low TSH level 12/25/2019  . CHF (congestive heart failure) (HCC) 12/24/2019  . Type 2 diabetes mellitus (HCC) 04/21/2016  . Hypertension associated with diabetes (HCC) 03/17/2016    03/19/2016 PT, DPT 06/17/20 12:45 PM   [facility] [address] [place], [place], [place] Phone: [phone]   Fax:  [phone]  Name: LILU MCGLOWN MRN: Lawrence Marseilles Date of Birth: 12/25/1965

## 2020-06-25 ENCOUNTER — Encounter: Payer: 59 | Admitting: Physical Therapy

## 2020-06-27 ENCOUNTER — Encounter: Payer: 59 | Admitting: Physical Therapy

## 2020-07-01 ENCOUNTER — Other Ambulatory Visit: Payer: Self-pay

## 2020-07-01 ENCOUNTER — Ambulatory Visit: Payer: 59 | Admitting: Physical Therapy

## 2020-07-01 ENCOUNTER — Encounter: Payer: Self-pay | Admitting: Physical Therapy

## 2020-07-01 DIAGNOSIS — M6281 Muscle weakness (generalized): Secondary | ICD-10-CM | POA: Diagnosis not present

## 2020-07-01 DIAGNOSIS — M545 Low back pain, unspecified: Secondary | ICD-10-CM

## 2020-07-01 NOTE — Therapy (Signed)
[facility] [address] [place], [place], [address] Phone: [phone]   Fax:  [phone]  Physical Therapy Treatment  Patient Details  Name: Savannah Morales MRN: [id number] Date of Birth: 1966-01-26 Referring Provider (PT): Dr. Denyse Amass   Encounter Date: 07/01/2020   PT End of Session - 07/01/20 1239    Visit Number 2    Number of Visits 17    Date for PT Re-Evaluation 07/17/20    Authorization Type [employer]    PT Start Time 1218    PT Stop Time 1300    PT Time Calculation (min) 42 min    Activity Tolerance Patient tolerated treatment well    Behavior During Therapy [facility] for tasks assessed/performed           Past Medical History:  Diagnosis Date  . CHF (congestive heart failure) (HCC)   . Diabetes mellitus without complication (HCC)    Type II  . History of chicken pox   . Hypertension   . Kidney stones     Past Surgical History:  Procedure Laterality Date  . ANKLE SURGERY    . PVC ABLATION N/A 01/31/2020   Procedure: PVC ABLATION;  Surgeon: Lanier Prude, MD;  Location: [facility] INVASIVE CV LAB;  Service: Cardiovascular;  Laterality: N/A;  . RIGHT/LEFT HEART CATH AND CORONARY ANGIOGRAPHY N/A 02/27/2020   Procedure: RIGHT/LEFT HEART CATH AND CORONARY ANGIOGRAPHY;  Surgeon: Dolores Patty, MD;  Location: [facility] INVASIVE CV LAB;  Service: Cardiovascular;  Laterality: N/A;    There were no vitals filed for this visit.   Subjective Assessment - 07/01/20 1221    Subjective Pt states she has had less pain than before. She has had improvement in pain and feels that the HEP helps. She states she has little to no radiating pain down the leg.    Limitations Standing;Walking;Sitting    How long can you sit comfortably? As long as she would like    How long can you stand comfortably? <1 hour    How long can you walk comfortably? 15 mins    Patient Stated Goals Pt states she would like to return to daily life without  pain.    Currently in Pain? Yes    Pain Score 5     Pain Location Back    Pain Orientation Left    Pain Descriptors / Indicators Aching;Burning;Shooting    Pain Onset More than a month ago                             [facility] Adult PT Treatment/Exercise - 07/01/20 0001      Transfers   Transfers Sit to Stand    Sit to Stand With upper extremity assist      Ambulation/Gait   Gait Pattern Decreased step length - right;Decreased stance time - left;Decreased stride length;Decreased hip/knee flexion - left;Decreased hip/knee flexion - right;Decreased weight shift to right;Trendelenburg;Lateral trunk lean to right    Stairs Yes    Stair Management Technique Alternating pattern   decreased eccentric control with L     Exercises   Exercises Knee/Hip;Lumbar      Lumbar Exercises: Stretches   Lower Trunk Rotation 30 seconds;3 reps    Other Lumbar Stretch Exercise seated QL 30s3x    Other Lumbar Stretch Exercise seated fig 4 30s 3x      Lumbar Exercises: Standing   Other Standing Lumbar Exercises Paloff press double red  2x10 each      Lumbar Exercises: Seated   Other Seated Lumbar Exercises seated tilts 10x    Other Seated Lumbar Exercises fig 4 seated stretch 30s 3x      Lumbar Exercises: Supine   Pelvic Tilt 20 reps    Pelvic Tilt Limitations 2s    Bridge 20 reps    Bridge Limitations with green TB loop at knees      Manual Therapy   Manual Therapy Joint mobilization;Soft tissue mobilization    Joint Mobilization L1-5 grade II PA, prone    Soft tissue mobilization L L/S paraspinals, QL, deep lateral hip rotators                  PT Education - 07/01/20 1239    Education Details HEP, daily movement variablity, postural changes, safety with beginner yoga, increased movement variation at work    [employer]) Educated Patient    Methods Explanation;Demonstration;Handout    Comprehension Verbalized understanding;Returned demonstration            PT  Short Term Goals - 06/17/20 1235      PT SHORT TERM GOAL #1   Title Pt will demonstrate independence with HEP in order to show synthesis of PT education and compliance.    Time 2    Period Days      PT SHORT TERM GOAL #2   Title Pt will demonstrate ability to tolerate end range L/S extension in standing in order to demonstrate functional improvement in lumbar ROM.    Time 4    Period Weeks    Status New             PT Long Term Goals - 06/17/20 1238      PT LONG TERM GOAL #1   Title Pt will demonstrate at least a 12.8 improvement in Oswestry Index in order to demonstrate a clinically significant change in LBP and function.    Time 6    Period Weeks    Status New      PT LONG TERM GOAL #2   Title Pt will be able to lift and squat at least 45 lbs in order to demonstrate functional improvement in LE and lumbopelvic motor control for full return to ADL and household duties.    Time 8    Period Weeks    Status New                 Plan - 07/01/20 1241    Clinical Impression Statement Pt had increased soft tissue extensibility in L/S paraspinals after STM and joint mobilization. Pt was draped appropriately with towel due to work attire/dress.   Pt required increased VC and TC for PPT during HEP review. Pt also required VC and TC for glute activation during bridging to reduce L/S extension. Pt was able to progress to bridging with ABD as well as Paloff press without increasing pain. However, pt required increased TC for abdominal brace and decreased trunk lean during paloff press, likely due to core weakness. Pt would benefit from continued skilled therapy in order to maximize functional mobility and lumbopelvic strength for pain free return to goals.    Personal Factors and Comorbidities Age;Comorbidity 1;Comorbidity 2;Behavior Pattern;Fitness    Examination-Activity Limitations Lift;Squat;Stand;Bed Mobility;Caring for Others    Examination-Participation Restrictions Yard  Work;Cleaning;Laundry;Community Activity    Stability/Clinical Decision Making Evolving/Moderate complexity    Rehab Potential Good    PT Frequency 2x / week    PT Duration 8  weeks    PT Treatment/Interventions ADLs/Self Care Home Management;Aquatic Therapy;Biofeedback;Electrical Stimulation;Cryotherapy;Moist Heat;Iontophoresis 4mg /ml Dexamethasone;Traction;Stair training;Functional mobility training;Therapeutic activities;Therapeutic exercise;Balance training;Neuromuscular re-education;Patient/family education;Manual techniques;Scar mobilization;Passive range of motion;Dry needling;Taping;Spinal Manipulations;Joint Manipulations    PT Next Visit Plan hip flexor stretch, bridge with band, STS, palloff press    PT Home Exercise Plan    Consulted and Agree with Plan of Care Patient           Patient will benefit from skilled therapeutic intervention in order to improve the following deficits and impairments:  Abnormal gait,Decreased endurance,Decreased mobility,Difficulty walking,Increased muscle spasms,Decreased range of motion,Decreased strength,Pain,Postural dysfunction  Visit Diagnosis: Low back pain, unspecified back pain laterality, unspecified chronicity, unspecified whether sciatica present  Muscle weakness (generalized)     Problem List Patient Active Problem List   Diagnosis Date Noted  . PVC's (premature ventricular contractions) 02/12/2020  . Low TSH level 12/25/2019  . CHF (congestive heart failure) (HCC) 12/24/2019  . Type 2 diabetes mellitus (HCC) 04/21/2016  . Hypertension associated with diabetes (HCC) 03/17/2016    03/19/2016 PT, DPT 07/01/20 1:01 PM   [facility] [address] [place], [place], [place] Phone: [phone]   Fax:  [phone]  Name: Savannah Morales MRN: Lawrence Marseilles Date of Birth: 20-Oct-1965

## 2020-07-03 ENCOUNTER — Ambulatory Visit: Payer: 59 | Admitting: Physical Therapy

## 2020-07-03 ENCOUNTER — Encounter: Payer: Self-pay | Admitting: Physical Therapy

## 2020-07-03 ENCOUNTER — Other Ambulatory Visit: Payer: Self-pay

## 2020-07-03 DIAGNOSIS — M545 Low back pain, unspecified: Secondary | ICD-10-CM | POA: Diagnosis not present

## 2020-07-03 DIAGNOSIS — M6281 Muscle weakness (generalized): Secondary | ICD-10-CM | POA: Diagnosis not present

## 2020-07-03 NOTE — Therapy (Signed)
[facility] [address] [place], [place], [address] Phone: [phone]   Fax:  [phone]  Physical Therapy Treatment  Patient Details  Name: Savannah Morales MRN: [id number] Date of Birth: 02/16/1966 Referring Provider (PT): Dr. Denyse Amass   Encounter Date: 07/03/2020   PT End of Session - 07/03/20 1215    Visit Number 3    Number of Visits 17    Date for PT Re-Evaluation 07/17/20    Authorization Type [employer]    PT Start Time 1215    PT Stop Time 1255    PT Time Calculation (min) 40 min    Activity Tolerance Patient tolerated treatment well    Behavior During Therapy [facility] for tasks assessed/performed           Past Medical History:  Diagnosis Date  . CHF (congestive heart failure) (HCC)   . Diabetes mellitus without complication (HCC)    Type II  . History of chicken pox   . Hypertension   . Kidney stones     Past Surgical History:  Procedure Laterality Date  . ANKLE SURGERY    . PVC ABLATION N/A 01/31/2020   Procedure: PVC ABLATION;  Surgeon: Lanier Prude, MD;  Location: [facility] INVASIVE CV LAB;  Service: Cardiovascular;  Laterality: N/A;  . RIGHT/LEFT HEART CATH AND CORONARY ANGIOGRAPHY N/A 02/27/2020   Procedure: RIGHT/LEFT HEART CATH AND CORONARY ANGIOGRAPHY;  Surgeon: Dolores Patty, MD;  Location: [facility] INVASIVE CV LAB;  Service: Cardiovascular;  Laterality: N/A;    There were no vitals filed for this visit.   Subjective Assessment - 07/03/20 1221    Subjective Pt states that she has little to no pain since last session. She feels the seated posterior hip stretch was helpful and she notices less/no pain at work. She was not sore after last session.    Limitations Standing;Walking;Sitting    How long can you sit comfortably? As long as she would like    How long can you stand comfortably? <1 hour    How long can you walk comfortably? 15 mins    Patient Stated Goals Pt states she would like to return to  daily life without pain.    Currently in Pain? No/denies    Pain Score 0-No pain    Pain Location Back    Pain Orientation Left    Pain Descriptors / Indicators Aching;Burning;Shooting    Pain Onset More than a month ago                             [facility] Adult PT Treatment/Exercise - 07/03/20 0001      Ambulation/Gait   Stair Management Technique --   decreased eccentric control with L     Exercises   Exercises Knee/Hip;Lumbar      Lumbar Exercises: Stretches   Lower Trunk Rotation 30 seconds;3 reps    Other Lumbar Stretch Exercise    Other Lumbar Stretch Exercise seated fig 4 30s 3x      Lumbar Exercises: Standing   Other Standing Lumbar Exercises      Lumbar Exercises: Seated   Other Seated Lumbar Exercises seated tilts 10x    Other Seated Lumbar Exercises fig 4 seated stretch 30s 3x      Lumbar Exercises: Supine   Pelvic Tilt 20 reps    Pelvic Tilt Limitations 2s    Bridge 20 reps    Bridge Limitations  Lumbar Exercises: Quadruped   Madcat/Old Horse 10 reps    Other Quadruped Lumbar Exercises prayer stretch 3 way 10s 10x      Knee/Hip Exercises: Supine   Bridges 2 sets;10 reps      Manual Therapy   Manual Therapy Joint mobilization;Soft tissue mobilization    Joint Mobilization L1-5 grade III PA, prone    Soft tissue mobilization L L/S paraspinals, QL, deep lateral hip rotators                  PT Education - 07/03/20 1241    Education Details HEP, daily movement variablity, postural changes,  increased movement variation, increasing exercise activity    Person(s) Educated Patient    Methods Explanation;Demonstration;Handout    Comprehension Verbalized understanding;Returned demonstration            PT Short Term Goals - 06/17/20 1235      PT SHORT TERM GOAL #1   Title Pt will demonstrate independence with HEP in order to show synthesis of PT education and compliance.    Time 2    Period Days      PT SHORT TERM GOAL  #2   Title Pt will demonstrate ability to tolerate end range L/S extension in standing in order to demonstrate functional improvement in lumbar ROM.    Time 4    Period Weeks    Status New             PT Long Term Goals - 06/17/20 1238      PT LONG TERM GOAL #1   Title Pt will demonstrate at least a 12.8 improvement in Oswestry Index in order to demonstrate a clinically significant change in LBP and function.    Time 6    Period Weeks    Status New      PT LONG TERM GOAL #2   Title Pt will be able to lift and squat at least 45 lbs in order to demonstrate functional improvement in LE and lumbopelvic motor control for full return to ADL and household duties.    Time 8    Period Weeks    Status New                 Plan - 07/03/20 1215    Clinical Impression Statement Pt presents with improved soft tissue tone at today's session but continues to have L sided paraspinal spasm. Localized twitch response was elicited with ischemic pressure. Pt was able to progress to quadruped spinal mobility and introduce 90/90 heel taps but required increased VC and heavy TC for abdominal brace and LE positioning. Pt had no pain with exercise today and improved ROM following session. Pt would benefit from continued skilled therapy in order to maximize functional mobility and lumbopelvic strength for pain free return to goals.    Personal Factors and Comorbidities Age;Comorbidity 1;Comorbidity 2;Behavior Pattern;Fitness    Examination-Activity Limitations Lift;Squat;Stand;Bed Mobility;Caring for Others    Examination-Participation Restrictions Yard Work;Cleaning;Laundry;Community Activity    Stability/Clinical Decision Making Evolving/Moderate complexity    Rehab Potential Good    PT Frequency 2x / week    PT Duration 8 weeks    PT Treatment/Interventions ADLs/Self Care Home Management;Aquatic Therapy;Biofeedback;Electrical Stimulation;Cryotherapy;Moist Heat;Iontophoresis 4mg /ml  Dexamethasone;Traction;Stair training;Functional mobility training;Therapeutic activities;Therapeutic exercise;Balance training;Neuromuscular re-education;Patient/family education;Manual techniques;Scar mobilization;Passive range of motion;Dry needling;Taping;Spinal Manipulations;Joint Manipulations    PT Next Visit Plan review 90/90 heel tap, deadbug,  bridge with band, STS, palloff press    PT Home Exercise Plan  Consulted and Agree with Plan of Care Patient           Patient will benefit from skilled therapeutic intervention in order to improve the following deficits and impairments:  Abnormal gait,Decreased endurance,Decreased mobility,Difficulty walking,Increased muscle spasms,Decreased range of motion,Decreased strength,Pain,Postural dysfunction  Visit Diagnosis: Low back pain, unspecified back pain laterality, unspecified chronicity, unspecified whether sciatica present  Muscle weakness (generalized)     Problem List Patient Active Problem List   Diagnosis Date Noted  . PVC's (premature ventricular contractions) 02/12/2020  . Low TSH level 12/25/2019  . CHF (congestive heart failure) (HCC) 12/24/2019  . Type 2 diabetes mellitus (HCC) 04/21/2016  . Hypertension associated with diabetes (HCC) 03/17/2016   Zebedee Iba PT, DPT 07/03/20 1:21 PM   [facility] [address] [place], [place], [address] Phone: [phone]   Fax:  [phone]  Name: Savannah Morales MRN: [id number] Date of Birth: 1965/12/23

## 2020-07-09 ENCOUNTER — Encounter (HOSPITAL_COMMUNITY): Payer: Self-pay

## 2020-07-09 ENCOUNTER — Telehealth (HOSPITAL_COMMUNITY): Payer: Self-pay | Admitting: *Deleted

## 2020-07-09 NOTE — Telephone Encounter (Signed)
Pt sent mychart message checking on FMLA paperwork for her daughter.  Routed to [employer]

## 2020-07-10 ENCOUNTER — Encounter: Payer: 59 | Admitting: Physical Therapy

## 2020-07-12 ENCOUNTER — Encounter: Payer: 59 | Admitting: Physical Therapy

## 2020-07-12 NOTE — Telephone Encounter (Signed)
Daughters forms for intermittent FMLA completed and signed by Dr Gala Romney. Faxed to [employer], pt is aware and copy emailed to her

## 2020-07-12 NOTE — Progress Notes (Signed)
   I, Savannah Morales, LAT, ATC, am serving as scribe for Dr. Clementeen Graham.  Savannah Morales is a 55 y.o. female who presents to [facility] today for f/u low back pain ongoing since late Dec 2021. Pt was seen by her PCP on 04/24/20 and had a Toradol IM injection and was prescribed Flexeril and oral Voltaren.  These things helped until mid-January when the pain returned. Pt was last seen by Dr. Denyse Morales on 06/03/20 and was referred to PT of which she's completed 3 visits. Pt contacted the clinic via MyChart on 06/06/20 reports nausea from Gabapentin and was transitioned to Lyrica. Today, pt reports that she does feel like PT is helping and feels improved in terms of her LBP, rating her improvement at 80%.  She states that the radiating pain has resolved into her L thigh.  She has more PT scheduled at [facility].     Diagnostic imaging: L-spine XR- 06/03/20  Pertinent review of systems: No fevers or chills  Relevant historical information: Heart failure, diabetes   Exam:  BP 102/76 (BP Location: Right Arm, Patient Position: Sitting, Cuff Size: Normal)   Pulse 62   Ht 5\' 8"  (1.727 m)   Wt 179 lb 9.6 oz (81.5 kg)   SpO2 95%   BMI 27.31 kg/m  General: Well Developed, well nourished, and in no acute distress.   MSK: L-spine normal-appearing normal motion normal gait.    Lab and Radiology Results  EXAM: LUMBAR SPINE - 2-3 VIEW  COMPARISON:  None.  FINDINGS: Stepwise grade 1 anterolisthesis at the L4-5, L5-S1 level of up to 4 mm. Vertebral body heights are preserved. No fracture or focal osseous lesion. Multilevel endplate degenerative spurring with mild L5-S1 disc space loss. Soft tissues are within normal limits.  IMPRESSION: Multilevel spondylosis most prominent at the L5-S1 level.   Electronically Signed   By: M.D.   On: 06/03/2020 10:33  I, 08/01/2020, personally (independently) visualized and performed the interpretation of the  images attached in this note.    Assessment and Plan: 55 y.o. female with low back pain with possible L2-3 lumbar radiculopathy to the left side.  Significantly improved with physical therapy.  Plan to continue PT and home exercise program.  Recheck back with me.  Precautions reviewed.   Discussed warning signs or symptoms. Please see discharge instructions. Patient expresses understanding.  The above documentation has been reviewed and is accurate and complete 44, M.D.   Total encounter time 20 minutes including face-to-face time with the patient and, reviewing past medical record, and charting on the date of service.   Treatment plan and options.

## 2020-07-15 ENCOUNTER — Ambulatory Visit: Payer: 59 | Admitting: Family Medicine

## 2020-07-15 ENCOUNTER — Encounter: Payer: Self-pay | Admitting: Family Medicine

## 2020-07-15 ENCOUNTER — Other Ambulatory Visit: Payer: Self-pay

## 2020-07-15 VITALS — BP 102/76 | HR 62 | Ht 68.0 in | Wt 179.6 lb

## 2020-07-15 DIAGNOSIS — M5441 Lumbago with sciatica, right side: Secondary | ICD-10-CM

## 2020-07-15 DIAGNOSIS — G8929 Other chronic pain: Secondary | ICD-10-CM

## 2020-07-15 DIAGNOSIS — M5442 Lumbago with sciatica, left side: Secondary | ICD-10-CM | POA: Diagnosis not present

## 2020-07-15 NOTE — Patient Instructions (Signed)
Thank you for coming in today.  Continue PT and transition to home exercises.   Recheck with me as needed.   If needed more PT in the future let me know.   I am here for you.

## 2020-07-16 ENCOUNTER — Other Ambulatory Visit: Payer: Self-pay

## 2020-07-16 ENCOUNTER — Ambulatory Visit (INDEPENDENT_AMBULATORY_CARE_PROVIDER_SITE_OTHER): Payer: 59 | Admitting: Physical Therapy

## 2020-07-16 ENCOUNTER — Encounter: Payer: Self-pay | Admitting: Physical Therapy

## 2020-07-16 DIAGNOSIS — M545 Low back pain, unspecified: Secondary | ICD-10-CM | POA: Diagnosis not present

## 2020-07-16 DIAGNOSIS — M6281 Muscle weakness (generalized): Secondary | ICD-10-CM | POA: Diagnosis not present

## 2020-07-16 NOTE — Therapy (Signed)
[facility] [address] [place], [place], [address] Phone: [phone]   Fax:  [phone]  Physical Therapy Treatment  Patient Details  Name: Savannah Morales MRN: [id number] Date of Birth: 1965-06-02 Referring Provider (PT): Dr. Denyse Amass   Encounter Date: 07/16/2020   PT End of Session - 07/16/20 1249    Visit Number 4    Number of Visits 17    Date for PT Re-Evaluation 07/17/20    Authorization Type [employer]    PT Start Time 1220    PT Stop Time 1300    PT Time Calculation (min) 40 min    Activity Tolerance Patient tolerated treatment well    Behavior During Therapy [facility] for tasks assessed/performed           Past Medical History:  Diagnosis Date  . CHF (congestive heart failure) (HCC)   . Diabetes mellitus without complication (HCC)    Type II  . History of chicken pox   . Hypertension   . Kidney stones     Past Surgical History:  Procedure Laterality Date  . ANKLE SURGERY    . PVC ABLATION N/A 01/31/2020   Procedure: PVC ABLATION;  Surgeon: Lanier Prude, MD;  Location: [facility] INVASIVE CV LAB;  Service: Cardiovascular;  Laterality: N/A;  . RIGHT/LEFT HEART CATH AND CORONARY ANGIOGRAPHY N/A 02/27/2020   Procedure: RIGHT/LEFT HEART CATH AND CORONARY ANGIOGRAPHY;  Surgeon: Dolores Patty, MD;  Location: [facility] INVASIVE CV LAB;  Service: Cardiovascular;  Laterality: N/A;    There were no vitals filed for this visit.   Subjective Assessment - 07/16/20 1222    Subjective Pt states that she has little to no pain since last session. Pt states she has been doing beginner yoga and has had no issues so far. She states good compliance with HEP. "I am about 80% better."    Limitations Standing;Walking;Sitting    How long can you sit comfortably? As long as she would like    How long can you stand comfortably? <1 hour    How long can you walk comfortably? 15 mins    Patient Stated Goals Pt states she would like to  return to daily life without pain.    Currently in Pain? No/denies    Pain Score 0-No pain    Pain Onset More than a month ago                             [facility] Adult PT Treatment/Exercise - 07/16/20 0001      Ambulation/Gait   Stair Management Technique --   decreased eccentric control with L     Exercises   Exercises Knee/Hip;Lumbar                       Lumbar Exercises: Standing   Functional Squats 20 reps    Functional Squats Limitations green band around knees    Other Standing Lumbar Exercises Paloff press double red 2x10 each      Lumbar Exercises: Seated   Other Seated Lumbar Exercises --    Other Seated Lumbar Exercises fig 4 seated stretch 30s 3x      Lumbar Exercises: Supine   Pelvic Tilt --    Pelvic Tilt Limitations --    Bridge 20 reps    Bridge Limitations with green TB loop at knees      Lumbar Exercises: Quadruped   Madcat/Old Horse  10 reps    Other Quadruped Lumbar Exercises quadruped hip extension 10x each      Knee/Hip Exercises: Supine   Bridges 2 sets;10 reps      Manual Therapy   Manual Therapy Joint mobilization;Soft tissue mobilization    Joint Mobilization L1-5 grade III PA, prone    Soft tissue mobilization L L/S paraspinals, QL, deep lateral hip rotators                  PT Education - 07/16/20 1248    Education Details HEP, daily movement variablity, postural changes, exercise progression    Person(s) Educated Patient    Methods Explanation;Demonstration;Handout    Comprehension Verbalized understanding;Returned demonstration            PT Short Term Goals - 06/17/20 1235      PT SHORT TERM GOAL #1   Title Pt will demonstrate independence with HEP in order to show synthesis of PT education and compliance.    Time 2    Period Days      PT SHORT TERM GOAL #2   Title Pt will demonstrate ability to tolerate end range L/S extension in standing in order to demonstrate functional improvement in lumbar  ROM.    Time 4    Period Weeks    Status New             PT Long Term Goals - 06/17/20 1238      PT LONG TERM GOAL #1   Title Pt will demonstrate at least a 12.8 improvement in Oswestry Index in order to demonstrate a clinically significant change in LBP and function.    Time 6    Period Weeks    Status New      PT LONG TERM GOAL #2   Title Pt will be able to lift and squat at least 45 lbs in order to demonstrate functional improvement in LE and lumbopelvic motor control for full return to ADL and household duties.    Time 8    Period Weeks    Status New                 Plan - 07/16/20 1249    Clinical Impression Statement Pt demonstrates improved soft tissue extensibilty and ROM after manual therapy. Pt was able to progress core strengthening to quadruped position and STS. However, pt required increased VC and TC for pelvic stability and abdominal engagement. Pt had no pain with exercise and tolerated increased repetitions. Pt to decrease frequency of visits. Pt would benefit from continued skilled therapy in order to maximize functional mobility and lumbopelvic strength for pain free return to goals.    Personal Factors and Comorbidities Age;Comorbidity 1;Comorbidity 2;Behavior Pattern;Fitness    Examination-Activity Limitations Lift;Squat;Stand;Bed Mobility;Caring for Others    Examination-Participation Restrictions Yard Work;Cleaning;Laundry;Community Activity    Stability/Clinical Decision Making Evolving/Moderate complexity    Rehab Potential Good    PT Frequency 2x / week    PT Duration 8 weeks    PT Treatment/Interventions ADLs/Self Care Home Management;Aquatic Therapy;Biofeedback;Electrical Stimulation;Cryotherapy;Moist Heat;Iontophoresis 4mg /ml Dexamethasone;Traction;Stair training;Functional mobility training;Therapeutic activities;Therapeutic exercise;Balance training;Neuromuscular re-education;Patient/family education;Manual techniques;Scar mobilization;Passive  range of motion;Dry needling;Taping;Spinal Manipulations;Joint Manipulations    PT Next Visit Plan review hip extension quadruped, STS with band, deadbug    PT Home Exercise Plan    Consulted and Agree with Plan of Care Patient           Patient will benefit from skilled therapeutic intervention in order to improve  the following deficits and impairments:  Abnormal gait,Decreased endurance,Decreased mobility,Difficulty walking,Increased muscle spasms,Decreased range of motion,Decreased strength,Pain,Postural dysfunction  Visit Diagnosis: Low back pain, unspecified back pain laterality, unspecified chronicity, unspecified whether sciatica present  Muscle weakness (generalized)     Problem List Patient Active Problem List   Diagnosis Date Noted  . PVC's (premature ventricular contractions) 02/12/2020  . Low TSH level 12/25/2019  . CHF (congestive heart failure) (HCC) 12/24/2019  . Type 2 diabetes mellitus (HCC) 04/21/2016  . Hypertension associated with diabetes (HCC) 03/17/2016   Zebedee Iba PT, DPT 07/16/20 1:04 PM    [facility] [address] [place], [place], [address] Phone: [phone]   Fax:  [phone]  Name: Savannah Morales MRN: [id number] Date of Birth: 08/30/1965

## 2020-07-16 NOTE — Patient Instructions (Signed)
Access Code: [email or web address] URL: [email or web address] Date: 07/16/2020 Prepared by: Zebedee Iba  Exercises Supine Bridge - 1 x daily - 3-4 x weekly - 2 sets - 10 reps Seated Quadratus Lumborum Stretch with Arm Overhead - 2 x daily - 7 x weekly - 1 sets - 3 reps - 30 hold Seated Figure 4 Piriformis Stretch - 2 x daily - 7 x weekly - 1 sets - 3 reps - 30 hold Cat-Camel - 2 x daily - 7 x weekly - 1 sets - 10 reps - 3 hold Sit to Stand with Resistance Around Legs - 1 x daily - 3-4 x weekly - 3 sets - 10 reps Beginner Front Arm Support - 1 x daily - 3-4 x weekly - 3 sets - 10 reps

## 2020-07-25 ENCOUNTER — Ambulatory Visit: Payer: 59 | Admitting: Family Medicine

## 2020-07-26 ENCOUNTER — Encounter: Payer: Self-pay | Admitting: Physical Therapy

## 2020-07-26 ENCOUNTER — Other Ambulatory Visit: Payer: Self-pay

## 2020-07-26 ENCOUNTER — Ambulatory Visit: Payer: 59 | Admitting: Family Medicine

## 2020-07-26 ENCOUNTER — Ambulatory Visit: Payer: 59 | Admitting: Physical Therapy

## 2020-07-26 DIAGNOSIS — M6281 Muscle weakness (generalized): Secondary | ICD-10-CM

## 2020-07-26 DIAGNOSIS — Z0289 Encounter for other administrative examinations: Secondary | ICD-10-CM

## 2020-07-26 DIAGNOSIS — M545 Low back pain, unspecified: Secondary | ICD-10-CM | POA: Diagnosis not present

## 2020-07-26 NOTE — Therapy (Signed)
[place] [address] [place], [place], [address] Phone: [phone]   Fax:  [phone]  Physical Therapy Treatment/Progress Note  Patient Details  Name: Savannah Morales MRN: [id number] Date of Birth: 03-Feb-1966 Referring Provider (PT): Dr. Georgina Snell   Encounter Date: 07/26/2020   PT End of Session - 07/26/20 0911    Visit Number 5    Number of Visits 17    Date for PT Re-Evaluation 08/25/20    Authorization Type [employer]    PT Start Time [phone]    PT Stop Time 0930    PT Time Calculation (min) 40 min    Activity Tolerance Patient tolerated treatment well    Behavior During Therapy [facility] for tasks assessed/performed           Past Medical History:  Diagnosis Date  . CHF (congestive heart failure) ([place])   . Diabetes mellitus without complication ([facility])    Type II  . History of chicken pox   . Hypertension   . Kidney stones     Past Surgical History:  Procedure Laterality Date  . ANKLE SURGERY    . PVC ABLATION N/A 01/31/2020   Procedure: PVC ABLATION;  Surgeon: Vickie Epley, MD;  Location: [place] [facility];  Service: Cardiovascular;  Laterality: N/A;  . RIGHT/LEFT HEART CATH AND CORONARY ANGIOGRAPHY N/A 02/27/2020   Procedure: RIGHT/LEFT HEART CATH AND CORONARY ANGIOGRAPHY;  Surgeon: Jolaine Artist, MD;  Location: [place] [facility];  Service: Cardiovascular;  Laterality: N/A;    There were no vitals filed for this visit.   Subjective Assessment - 07/26/20 0856    Subjective Pt states that her back is doing better. She has stiffness in the morning after waking up and with work but it is not as painful as before. She states "the back is not bothering me that much anymore."    Limitations Standing;Walking;Sitting    How long can you sit comfortably? As long as she would like    How long can you stand comfortably? <1 hour    How long can you walk comfortably? 15 mins    Patient Stated Goals Pt states she  would like to return to daily life without pain.    Currently in Pain? No/denies    Pain Score 0-No pain    Pain Onset More than a month ago    Multiple Pain Sites No              [facility] PT Assessment - 07/26/20 0001      Assessment   Medical Diagnosis L sided LBP    Referring Provider (PT) Dr. Georgina Snell    Next MD Visit 06/2020    Prior Therapy Broken ankle      Precautions   Precautions None      Restrictions   Weight Bearing Restrictions No      Home Environment   Living Environment Private residence      Prior Function   Level of Independence Independent      Cognition   Overall Cognitive Status Within Functional Limits for tasks assessed      Observation/Other Assessments   Other Surveys  Oswestry Disability Index    Oswestry Disability Index  12% impairment      Sensation   Light Touch Appears Intact      Functional Tests   Functional tests Squat;Sit to Stand      Squat   Comments quad dominant squat, decreased hip extension,  Sit to Stand   Comments [facility]     AROM       Lumbar Flexion 80%   relieved pain   Lumbar Extension 75%    Lumbar - Right Side Bend 80%   relieved pain   Lumbar - Left Side Bend 80%    Lumbar - Right Rotation 80%    Lumbar - Left Rotation 80%      Strength   Overall Strength Deficits    Overall Strength Comments 4+/5 throughout hips and LE, no pain with testing, transfers fluid and without hesitation      Flexibility   Soft Tissue Assessment /Muscle Length yes    Hamstrings min/mod  limited    Quadriceps mod limited L>R    Quadratus Lumborum slightly hypertonus but does not affect lumbar ROM      Palpation   Spinal mobility min stiffness L1-5 with CPA    Palpation comment signficantly reduced hypertonicity of bilateral paraspinals, QL, L hip flexor and quads to deep palpation      FABER test   findings Negative      Slump test   Findings Negative      Straight Leg Raise   Findings Negative      Sacral  Compression   Findings Negative      Transfers   Transfers Sit to Stand    Sit to Stand 7: Independent      Ambulation/Gait   Gait Pattern Decreased hip/knee flexion - left;Decreased hip/knee flexion - right;Trendelenburg;                            [facility] Adult PT Treatment/Exercise - 07/26/20 0001               Exercises   Exercises Knee/Hip;Lumbar      Lumbar Exercises: Stretches   Lower Trunk Rotation 30 seconds;3 reps    Other Lumbar Stretch Exercise prayer stretch 10s 3x    Other Lumbar Stretch Exercise seated fig 4 30s 3x      Lumbar Exercises: Standing   Functional Squats 20 reps    Functional Squats Limitations green band around knees    Other Standing Lumbar Exercises Paloff press double red 2x10 each      Lumbar Exercises: Seated   Other Seated Lumbar Exercises fig 4 seated stretch 30s 3x      Lumbar Exercises: Supine   Bridge 20 reps    Bridge Limitations with green TB loop at knees   reviewed for home     Lumbar Exercises: Quadruped   [place]cat/Old Horse 10 reps    Other Quadruped Lumbar Exercises bird dog 10x      Knee/Hip Exercises: Supine   Bridges 2 sets;10 reps      Manual Therapy   Manual Therapy Joint mobilization;Soft tissue mobilization    Joint Mobilization L1-5 grade III PA, prone    Soft tissue mobilization L L/S paraspinals, QL, deep lateral hip rotators                  PT Education - 07/26/20 0940    Education Details HEP, daily movement variablity, postural changes, exercise progression, POC    Person(s) Educated Patient    Methods Explanation;Demonstration;Tactile cues    Comprehension Verbalized understanding;Returned demonstration            PT Short Term Goals - 07/26/20 0946      PT SHORT TERM GOAL #1   Title Pt  will demonstrate independence with HEP in order to show synthesis of PT education and compliance.    Time 2    Period Days    Status Achieved      PT SHORT TERM GOAL #2   Title Pt will  demonstrate ability to tolerate end range L/S extension in standing in order to demonstrate functional improvement in lumbar ROM.    Time 4    Period Weeks    Status Partially Met             PT Long Term Goals - 07/26/20 0946      PT LONG TERM GOAL #1   Title Pt will demonstrate at least a 12.8 improvement in Oswestry Index in order to demonstrate a clinically significant change in LBP and function.    Time 6    Period Weeks    Status Partially Met      PT LONG TERM GOAL #2   Title Pt will be able to lift and squat at least 45 lbs in order to demonstrate functional improvement in LE and lumbopelvic motor control for full return to ADL and household duties.    Time 8    Period Weeks    Status On-going                 Plan - 07/26/20 0941    Clinical Impression Statement Pt demonstrates significant improved with L/S ROM, LE strengt, and function as demonstrated by objective measures as well as Oswestry Index. Pt is able to perform core strengthening exercise with increased difficulty. However, she does require increased cuing for for technique and lumbopelvic stability. Pt responds well to verbal cuing/imagery of movements when performing quadruped UE/LE lifts. Pt likely able to progress strengthening at next session. Pt would benefit from continued skilled therapy in order to maximize functional mobility and lumbopelvic strength for pain free return to goals.    Personal Factors and Comorbidities Age;Comorbidity 1;Comorbidity 2;Behavior Pattern;Fitness    Examination-Activity Limitations Lift;Squat;Stand;Bed Mobility;Caring for Others    Examination-Participation Restrictions Yard Work;Cleaning;Laundry;Community Activity    Stability/Clinical Decision Making Evolving/Moderate complexity    Rehab Potential Good    PT Frequency 2x / week    PT Duration 8 weeks    PT Treatment/Interventions ADLs/Self Care Home Management;Aquatic Therapy;Biofeedback;Electrical  Stimulation;Cryotherapy;Moist Heat;Iontophoresis 73m/ml Dexamethasone;Traction;Stair training;Functional mobility training;Therapeutic activities;Therapeutic exercise;Balance training;Neuromuscular re-education;Patient/family education;Manual techniques;Scar mobilization;Passive range of motion;Dry needling;Taping;Spinal Manipulations;Joint Manipulations    PT Next Visit Plan review bird dog, hip hinge, farmer's carry    PT Home Exercise Plan [email or web address]   Consulted and Agree with Plan of Care Patient           Patient will benefit from skilled therapeutic intervention in order to improve the following deficits and impairments:  Abnormal gait,Decreased endurance,Decreased mobility,Difficulty walking,Increased muscle spasms,Decreased range of motion,Decreased strength,Pain,Postural dysfunction  Visit Diagnosis: Low back pain, unspecified back pain laterality, unspecified chronicity, unspecified whether sciatica present  Muscle weakness (generalized)     Problem List Patient Active Problem List   Diagnosis Date Noted  . PVC's (premature ventricular contractions) 02/12/2020  . Low TSH level 12/25/2019  . CHF (congestive heart failure) ([place] 12/24/2019  . Type 2 diabetes mellitus ([place] 04/21/2016  . Hypertension associated with diabetes ([place] 03/17/2016    ADaleen BoPT, DPT 07/26/20 9:50 AM   [place] [address]Phone: [phone]  Fax:  [phone] Name: KOLEAN SANGSTERMRN: [id number] of Birth:  [birth year]

## 2020-07-26 NOTE — Patient Instructions (Signed)
Access Code: [email or web address] URL: [email or web address] Date: 07/26/2020 Prepared by: Zebedee Iba  Exercises Supine Bridge - 1 x daily - 3-4 x weekly - 2 sets - 10 reps Seated Quadratus Lumborum Stretch with Arm Overhead - 2 x daily - 7 x weekly - 1 sets - 3 reps - 30 hold Seated Figure 4 Piriformis Stretch - 2 x daily - 7 x weekly - 1 sets - 3 reps - 30 hold Cat-Camel - 2 x daily - 7 x weekly - 1 sets - 10 reps - 3 hold Sit to Stand with Resistance Around Legs - 1 x daily - 3-4 x weekly - 3 sets - 10 reps Bird Dog - 1 x daily - 3-4 x weekly - 2 sets - 10 reps Standing Anti-Rotation Press with Anchored Resistance - 1 x daily - 3-4 x weekly - 3 sets - 10 reps

## 2020-07-29 ENCOUNTER — Other Ambulatory Visit: Payer: Self-pay

## 2020-07-29 ENCOUNTER — Ambulatory Visit (INDEPENDENT_AMBULATORY_CARE_PROVIDER_SITE_OTHER): Payer: 59 | Admitting: Family Medicine

## 2020-07-29 ENCOUNTER — Encounter: Payer: Self-pay | Admitting: Family Medicine

## 2020-07-29 VITALS — BP 121/66 | HR 68 | Temp 97.7°F | Ht 68.0 in | Wt 179.8 lb

## 2020-07-29 DIAGNOSIS — E119 Type 2 diabetes mellitus without complications: Secondary | ICD-10-CM

## 2020-07-29 DIAGNOSIS — S46219S Strain of muscle, fascia and tendon of other parts of biceps, unspecified arm, sequela: Secondary | ICD-10-CM | POA: Diagnosis not present

## 2020-07-29 DIAGNOSIS — I152 Hypertension secondary to endocrine disorders: Secondary | ICD-10-CM

## 2020-07-29 DIAGNOSIS — E1159 Type 2 diabetes mellitus with other circulatory complications: Secondary | ICD-10-CM | POA: Diagnosis not present

## 2020-07-29 MED ORDER — MELOXICAM 15 MG PO TABS: 15.0000 mg | ORAL_TABLET | Freq: Every day | ORAL | 0 refills | Status: DC

## 2020-07-29 NOTE — Assessment & Plan Note (Signed)
Last A1c 6.0.  Continue Jardiance 10 mg daily and Metformin 500 mg daily.  We will recheck A1c when she comes back for CPE.

## 2020-07-29 NOTE — Progress Notes (Signed)
   Savannah Morales is a 55 y.o. female who presents today for an office visit.  Assessment/Plan:  New/Acute Problems: Bicep strain No red flags.  Will start meloxicam.  Discussed home exercises and handout was given.  Discussed reasons return to care.  Consider referral to sports medicine if symptoms are not improving.  Chronic Problems Addressed Today: Type 2 diabetes mellitus (HCC) Last A1c 6.0.  Continue Jardiance 10 mg daily and Metformin 500 mg daily.  We will recheck A1c when she comes back for CPE.  Hypertension associated with diabetes (HCC) At goal on metoprolol succinate 50 mg daily.     Subjective:  HPI:  Patient with left arm pain for the last few weeks.  Located [place].  No obvious injuries or precipitating events.  Tried Tylenol which is not helped.  She had symptoms similar in the past which resolved on its own.  Symptoms are stable.  See a/p for status of chronic conditions.        Objective:  Physical Exam: BP 121/66   Pulse 68   Temp 97.7 F (36.5 C) (Temporal)   Ht 5\' 8"  (1.727 m)   Wt 179 lb 12.8 oz (81.6 kg)   SpO2 96%   BMI 27.34 kg/m   Gen: No acute distress, resting comfortably MSK: Left biceps tender to palpation.  Pain elicited with resisted supination and elbow flexion. Neuro: Grossly normal, moves all extremities Psych: Normal affect and thought content      Savannah Morales M. , MD 07/29/2020 12:14 PM

## 2020-07-29 NOTE — Patient Instructions (Signed)
It was very nice to see you today!  You have a strain in your biceps.  Please start the meloxicam.  Please work on the exercises.  Let me know if not improving.  Take care, Dr [name]  PLEASE NOTE:  If you had any lab tests please let us know if you have not heard back within a few days. You may see your results on mychart before we have a chance to review them but we will give you a call once they are reviewed by [place]. If we ordered any referrals today, please let us know if you have not heard from their office within the next week.   Please try these tips to maintain a healthy lifestyle:   Eat at least 3 REAL meals and 1-2 snacks per day.  Aim for no more than 5 hours between eating.  If you eat breakfast, please do so within one hour of getting up.    Each meal should contain half fruits/vegetables, one quarter protein, and one quarter carbs (no bigger than a computer mouse)   Cut down on sweet beverages. This includes juice, soda, and sweet tea.     Drink at least 1 glass of water with each meal and aim for at least 8 glasses per day   Exercise at least 150 minutes every week.

## 2020-07-29 NOTE — Assessment & Plan Note (Signed)
At goal on metoprolol succinate 50 mg daily.

## 2020-07-31 ENCOUNTER — Other Ambulatory Visit: Payer: Self-pay

## 2020-07-31 ENCOUNTER — Ambulatory Visit (HOSPITAL_COMMUNITY)
Admission: RE | Admit: 2020-07-31 | Discharge: 2020-07-31 | Disposition: A | Discharge status: Home / Self Care | Payer: 59 | Source: Ambulatory Visit | Attending: Internal Medicine | Admitting: Internal Medicine

## 2020-07-31 ENCOUNTER — Encounter (HOSPITAL_COMMUNITY): Payer: Self-pay | Admitting: Internal Medicine

## 2020-07-31 VITALS — BP 104/60 | HR 74 | Wt 185.2 lb

## 2020-07-31 DIAGNOSIS — I493 Ventricular premature depolarization: Secondary | ICD-10-CM | POA: Diagnosis not present

## 2020-07-31 DIAGNOSIS — I110 Hypertensive heart disease with heart failure: Secondary | ICD-10-CM | POA: Insufficient documentation

## 2020-07-31 DIAGNOSIS — Z791 Long term (current) use of non-steroidal anti-inflammatories (NSAID): Secondary | ICD-10-CM | POA: Insufficient documentation

## 2020-07-31 DIAGNOSIS — Z79899 Other long term (current) drug therapy: Secondary | ICD-10-CM | POA: Diagnosis not present

## 2020-07-31 DIAGNOSIS — I5022 Chronic systolic (congestive) heart failure: Secondary | ICD-10-CM

## 2020-07-31 DIAGNOSIS — I5042 Chronic combined systolic (congestive) and diastolic (congestive) heart failure: Secondary | ICD-10-CM | POA: Diagnosis not present

## 2020-07-31 DIAGNOSIS — I340 Nonrheumatic mitral (valve) insufficiency: Secondary | ICD-10-CM | POA: Insufficient documentation

## 2020-07-31 DIAGNOSIS — E119 Type 2 diabetes mellitus without complications: Secondary | ICD-10-CM | POA: Insufficient documentation

## 2020-07-31 DIAGNOSIS — I472 Ventricular tachycardia: Secondary | ICD-10-CM | POA: Insufficient documentation

## 2020-07-31 DIAGNOSIS — Z7984 Long term (current) use of oral hypoglycemic drugs: Secondary | ICD-10-CM | POA: Insufficient documentation

## 2020-07-31 NOTE — Addendum Note (Signed)
Encounter addended by: Noralee Space, RN on: 07/31/2020 3:22 PM  Actions taken: Medication long-term status modified, Clinical Note Signed, Charge Capture section accepted, Order list changed, Diagnosis association updated

## 2020-07-31 NOTE — Progress Notes (Signed)
ADVANCED HF CLINIC NOTE  PCP: Ardith Dark, MD Primary Cardiologist: Dr. Flora Lipps  HPI:  Savannah Morales is a 55 y.o. female with a hx of DM, HTN and systolic HF due to NICM.   Says she was doing fine until she got the COVID vaccine [employer]) in 5/21. Admitted 12/24/19 with worsening SOB and cough. Echo showed EF 35%. Diuresed and GDMT started. No coronary calcium and no CP so cath not felt warranted,   cMRI 9/21 - Severely dilated LV EF 21% with normal wall thickness. Diffuse - RVEF 26%. - Severe left atrial enlargement, moderate right atrial enlargement. - Moderate to severe central mitral regurgitation with regurgitant fraction 48%. 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 Lalla Brothers   I saw her on 02/22/20 for the first time and was very tenuous. Multiple meds switched.   R/L cath  02/27/20 EF 20-25%. normal cors with well compensated hemodynamics  Ao = 91/65 (79) LV = 95/5 RA = 2 RV = 35/4 PA = 34/14 (21) PCW = 5 Fick cardiac output/index = 6.2/3.1 Thermo CO/CI =5.2/2.6 PVR =2.6 SVR = 952  FA sat = 97% PA sat = 72%  Zio 11/21  NSR. 8 episodes of NSVT, longest lasting 12.7 seconds at rate of 128bpm. Frequent PVC with burden of 13%.  Zio 12/21  NSR avg 83 bpm. No VT. PVC burden 7.3% with at least 3 morphologies of PVCs.  The most dominant PVC had a 5.8% burden.  Echo 2/22 EF 35-40% RV ok Personally reviewed  She is here for f/u. Has seen Dr. Lalla Brothers and Dr. Flora Lipps. Symptoms much improved. Repeat Zio monitor showed decreasing PVC burden with medical therapy. Echo as above with improving EF. Here with her daughter. Feels great. Denies CP or SOB. No edema. Walks dog 2x/day without problem.     ROS: All systems negative except as listed in HPI, PMH and Problem List.  SH:  Social History   Socioeconomic History  . Marital status: Divorced    Spouse name: Not on file  . Number of children: 3  .  Years of education: 80  . Highest education level: Not on file  Occupational History  . Occupation: Actor   Tobacco Use  . Smoking status: Never Smoker  . Smokeless tobacco: Never Used  Vaping Use  . Vaping Use: Never used  Substance and Sexual Activity  . Alcohol use: Not Currently    Comment: Rarely  . Drug use: No  . Sexual activity: Yes  Other Topics Concern  . Not on file  Social History Narrative  . Not on file   Social Determinants of Health   Financial Resource Strain: Not on file  Food Insecurity: No Food Insecurity  . Worried About Programme researcher, broadcasting/film/video in the Last Year: Never true  . Ran Out of Food in the Last Year: Never true  Transportation Needs: No Transportation Needs  . Lack of Transportation (Medical): No  . Lack of Transportation (Non-Medical): No  Physical Activity: Not on file  Stress: Not on file  Social Connections: Not on file  Intimate Partner Violence: Not on file    FH:  Family History  Problem Relation Age of Onset  . Hyperlipidemia Mother   . Hypertension Father   . Diabetes Father   . Cancer Paternal Grandmother     Past Medical History:  Diagnosis Date  . CHF (  congestive heart failure) (HCC)   . Diabetes mellitus without complication (H[facility])    Type II  . History of chicken pox   . Hypertension   . Kidney stones     Current Outpatient Medications  Medication Sig Dispense Refill  . benzoyl peroxide-erythromycin (BENZAMYCIN) gel Apply 1 application topically daily.     . digoxin (LANOXIN) 0.125 MG tablet TAKE 1 TABLET(0.125 MG) BY MOUTH DAILY 30 tablet 3  . ELDERBERRY PO Take 2 tablets by mouth daily.     . empagliflozin (JARDIANCE) 10 MG TABS tablet Take 1 tablet (10 mg total) by mouth daily before breakfast. 30 tablet 11  . ferrous sulfate 324 MG TBEC Take 324 mg by mouth.    . furosemide (LASIX) 40 MG tablet Take 1 tablet (40 mg total) by mouth every other day. 90 tablet 1  . meloxicam (MOBIC) 15 MG  tablet Take 1 tablet (15 mg total) by mouth daily. 30 tablet 0  . metFORMIN (GLUCOPHAGE-XR) 500 MG 24 hr tablet TAKE 1 TABLET BY MOUTH DAILY WITH BREAKFAST 60 tablet 2  . metoprolol succinate (TOPROL-XL) 50 MG 24 hr tablet TAKE 2 TABLETS(100 MG) BY MOUTH DAILY WITH OR IMMEDIATELY FOLLOWING A MEAL 90 tablet 0  . Multiple Vitamin (MULTIVITAMIN) capsule Take 1 capsule by mouth daily.    . Omega-3 Fatty Acids (FISH OIL) 1200 MG CPDR Take 2,400 capsules by mouth daily.     . Potassium Chloride ER 20 MEQ TBCR Take 20 mEq by mouth every other day. 90 tablet 1  . pregabalin (LYRICA) 75 MG capsule Take 1 capsule (75 mg total) by mouth 2 (two) times daily as needed. 60 capsule 3  . sacubitril-valsartan (ENTRESTO) 49-51 MG Take 1 tablet by mouth 2 (two) times daily. 60 tablet 5  . spironolactone (ALDACTONE) 25 MG tablet Take 1 tablet (25 mg total) by mouth daily. 90 tablet 3   No current facility-administered medications for this encounter.    Vitals:   07/31/20 1501  BP: 104/60  Pulse: 74  SpO2: 99%  Weight: 84 kg (185 lb 3.2 oz)   Wt Readings from Last 3 Encounters:  07/31/20 84 kg (185 lb 3.2 oz)  07/29/20 81.6 kg (179 lb 12.8 oz)  07/15/20 81.5 kg (179 lb 9.6 oz)    PHYSICAL EXAM: General:  Well appearing. No resp difficulty HEENT: normal Neck: supple. no JVD. Carotids 2+ bilat; no bruits. No lymphadenopathy or thryomegaly appreciated. Cor: PMI nondisplaced. Regular rate & rhythm. Occasional ectopy. No rubs, gallops or murmurs. Lungs: clear Abdomen: soft, nontender, nondistended. No hepatosplenomegaly. No bruits or masses. Good bowel sounds. Extremities: no cyanosis, clubbing, rash, edema Neuro: alert & orientedx3, cranial nerves grossly intact. moves all 4 extremities w/o difficulty. Affect pleasant    ASSESSMENT & PLAN:  1. Chronic combined systolic and diastolic CHF: - Diagnosed 8/21 EF 30-35% - cMRI 9/21 LVEF 21% RV EF 26% mod-sev MR. No LGE - R/L cath  02/27/20 EF 20-25%.  normal cors with well compensated hemodynamics - Felt to have PVC CM - s/p PVC ablation 01/31/20. Zio 11/21 13% PVC. Zio 12/21 7.3% PVCs - Doing very well. NYHA I.  - Volume status improved.  - Echo 2/22 EF 35-40% RV ok Personally reviewed - Continue Entresto to 49/51 bid. - Continue lasix 40 mg daily. OK to take extra prn. - Continue Jardiance 10 mg daily. - Continue Toprol  50 daily. - Stop digoxin - Continue spiro 25 daily. - BP too low to titrate meds further -  As above, PVC cardiomyopathy seems to be the smoking gun here but no improvement since ablation. Repeat Zio patch shows PVC burden 7.3%, with most dominant PVC had 5.8% - EF now improving. Will repeat in 6 months. If EF not >= 40% will try mexilitene for more complete suppression of PVCs   2.  Frequent PVCs: -  s/p PVC ablation 01/31/20 with Dr. Lalla Brothers - Zio 11/21  NSR. 8 episodes of NSVT, longest lasting 12.7 seconds at rate of 128bpm. Frequent PVC with burden of 13%. - Zio 12/21  NSR avg 83 bpm. No VT. PVC burden 7.3% with at least 3 morphologies of PVCs.  The most dominant PVC had a 5.8% burden. - has seen Dr. Lalla Brothers and she is hesitant to start AAD or repeat ablation. Can reconsider if EF not improving.  - Still with some PVCs. See plan as above  Arvilla Meres, MD 07/31/20 3:12 PM

## 2020-07-31 NOTE — Patient Instructions (Signed)
Stop Digoxin  Your physician recommends that you schedule a follow-up appointment in: 4 months with echo  If you have any questions or concerns before your next appointment please send [place] a message through [place] or call our office at [phone].    TO LEAVE A MESSAGE FOR THE NURSE SELECT OPTION 2, PLEASE LEAVE A MESSAGE INCLUDING: . YOUR NAME . DATE OF BIRTH . CALL BACK NUMBER . REASON FOR CALL**this is important as we prioritize the call backs  YOU WILL RECEIVE A CALL BACK THE SAME DAY AS LONG AS YOU CALL BEFORE 4:00 PM  At the [facility], you and your health needs are our priority. As part of our continuing mission to provide you with exceptional heart care, we have created designated Provider Care Teams. These Care Teams include your primary Cardiologist (physician) and Advanced Practice Providers (APPs- Physician Assistants and Nurse Practitioners) who all work together to provide you with the care you need, when you need it.   You may see any of the following providers on your designated Care Team at your next follow up: [name] Dr Arvilla Meres . Dr Marca Ancona . Dr Thornell Mule . Tonye Becket, NP . Robbie Lis, PA . Shanda Bumps [place],NP . Karle Plumber, PharmD   Please be sure to bring in all your medications bottles to every appointment.

## 2020-08-07 ENCOUNTER — Encounter: Payer: Self-pay | Admitting: Family Medicine

## 2020-08-07 DIAGNOSIS — M79622 Pain in left upper arm: Secondary | ICD-10-CM

## 2020-08-12 ENCOUNTER — Encounter: Payer: 59 | Admitting: Physical Therapy

## 2020-08-13 ENCOUNTER — Ambulatory Visit (INDEPENDENT_AMBULATORY_CARE_PROVIDER_SITE_OTHER): Payer: 59 | Admitting: Physical Therapy

## 2020-08-13 ENCOUNTER — Other Ambulatory Visit: Payer: Self-pay

## 2020-08-13 ENCOUNTER — Encounter: Payer: Self-pay | Admitting: Physical Therapy

## 2020-08-13 DIAGNOSIS — M6281 Muscle weakness (generalized): Secondary | ICD-10-CM

## 2020-08-13 DIAGNOSIS — M545 Low back pain, unspecified: Secondary | ICD-10-CM

## 2020-08-13 NOTE — Patient Instructions (Signed)
Access Code: [email or web address] URL: [email or web address] Date: 08/13/2020 Prepared by: Zebedee Iba  Exercises Supine Bridge - 1 x daily - 3-4 x weekly - 2 sets - 10 reps Seated Quadratus Lumborum Stretch with Arm Overhead - 2 x daily - 7 x weekly - 1 sets - 3 reps - 30 hold Seated Figure 4 Piriformis Stretch - 2 x daily - 7 x weekly - 1 sets - 3 reps - 30 hold Cat-Camel - 2 x daily - 7 x weekly - 1 sets - 10 reps - 3 hold Sit to Stand with Resistance Around Legs - 1 x daily - 3-4 x weekly - 3 sets - 10 reps Bird Dog - 1 x daily - 3-4 x weekly - 2 sets - 10 reps Standing Anti-Rotation Press with Anchored Resistance - 1 x daily - 3-4 x weekly - 3 sets - 10 reps

## 2020-08-13 NOTE — Therapy (Signed)
[facility] [address] [place], [place], [address] Phone: [phone]   Fax:  [phone]  Physical Therapy Discharge   Patient Details  Name: Savannah Morales MRN: [id number] Date of Birth: Sep 18, 1965 Referring Provider (PT): Dr. Georgina Snell   Encounter Date: 08/13/2020   PT End of Session - 08/13/20 1447    Visit Number 6    Number of Visits 17    Date for PT Re-Evaluation 08/25/20    Authorization Type [employer]    PT Start Time 1215    PT Stop Time 1255    PT Time Calculation (min) 40 min    Activity Tolerance Patient tolerated treatment well    Behavior During Therapy [facility] for tasks assessed/performed           Past Medical History:  Diagnosis Date  . CHF (congestive heart failure) ([place])   . Diabetes mellitus without complication ([place])    Type II  . History of chicken pox   . Hypertension   . Kidney stones     Past Surgical History:  Procedure Laterality Date  . ANKLE SURGERY    . PVC ABLATION N/A 01/31/2020   Procedure: PVC ABLATION;  Surgeon: Vickie Epley, MD;  Location: [facility];  Service: Cardiovascular;  Laterality: N/A;  . RIGHT/LEFT HEART CATH AND CORONARY ANGIOGRAPHY N/A 02/27/2020   Procedure: RIGHT/LEFT HEART CATH AND CORONARY ANGIOGRAPHY;  Surgeon: Jolaine Artist, MD;  Location: [facility];  Service: Cardiovascular;  Laterality: N/A;    There were no vitals filed for this visit.   Subjective Assessment - 08/13/20 1219    Subjective Pt states that the back feels totally normal and she has no had no pain since last session.    Limitations Standing;Walking;Sitting    How long can you sit comfortably? As long as she would like    How long can you stand comfortably? <1 hour    How long can you walk comfortably? 15 mins    Patient Stated Goals Pt states she would like to return to daily life without pain.    Currently in Pain? No/denies    Pain Score 0-No pain    Pain Onset  More than a month ago              [facility] PT Assessment - 08/13/20 0001      Assessment   Medical Diagnosis L sided LBP    Referring Provider (PT) Dr. Georgina Snell    Next MD Visit 06/2020    Prior Therapy Broken ankle      Precautions   Precautions None      Restrictions   Weight Bearing Restrictions No      Home Environment   Living Environment Private residence      Prior Function   Level of Independence Independent      Cognition   Overall Cognitive Status Within Functional Limits for tasks assessed      Observation/Other Assessments   Other Surveys  Oswestry Disability Index    Oswestry Disability Index  0%      Sensation   Light Touch Appears Intact      Functional Tests   Functional tests Squat;Sit to Stand      Squat   Comments improved hip extension, less quad dominance, required cuing for hip hinge      Sit to Stand   Comments able to perform without cuing, equal WB      AROM  Left Hip Internal Rotation  --   p!   Lumbar Flexion WFL    Lumbar Extension WFL    Lumbar - Right Side Bend 90%    Lumbar - Left Side Bend WFL    Lumbar - Right Rotation WFL    Lumbar - Left Rotation WFL      Strength   Overall Strength Deficits    Overall Strength Comments 4+/5 throughout hips and LE      Flexibility   Soft Tissue Assessment /Muscle Length yes    Hamstrings min/mod  limited    Quadriceps minlimited L>R    Quadratus Lumborum --      Palpation   Spinal mobility min stiffness L1-5 with CPA    Palpation comment reduced tone of bilateral paraspinals, QL, L hip flexor and quads to deep palpation      FABER test   findings Negative      Slump test   Findings Negative      Straight Leg Raise   Findings Negative      Sacral Compression   Findings Negative      Transfers   Transfers Sit to Stand    Sit to Stand 7: Independent                         [facility] Adult PT Treatment/Exercise - 08/13/20 0001      Ambulation/Gait   Gait  Pattern Decreased hip/knee flexion - left;Decreased hip/knee flexion - right;Decreased weight shift to right;Trendelenburg;Lateral trunk lean to right    Stairs Yes    Stair Management Technique --   decreased eccentric control with L     Exercises   Exercises Knee/Hip;Lumbar      Lumbar Exercises: Stretches   Lower Trunk Rotation 30 seconds;3 reps      Lumbar Exercises: Standing   Functional Squats 20 reps    Functional Squats Limitations green band around knees    Other Standing Lumbar Exercises Paloff press double blue 2x10 each      Lumbar Exercises: Seated   Other Seated Lumbar Exercises fig 4 seated stretch 30s 3x      Lumbar Exercises: Supine   Bridge 20 reps    Bridge Limitations blue TB loop at knees   blue band     Lumbar Exercises: Quadruped   Madcat/Old Horse 10 reps    Other Quadruped Lumbar Exercises bird dog 10x                  PT Education - 08/13/20 1223    Education Details HEP, daily movement variablity, postural changes, exercise progression, POC    Person(s) Educated Patient    Methods Demonstration;Tactile cues;Verbal cues;Explanation    Comprehension Verbalized understanding;Returned demonstration            PT Short Term Goals - 08/13/20 1449      PT SHORT TERM GOAL #1   Title Pt will demonstrate independence with HEP in order to show synthesis of PT education and compliance.    Time 2    Period Days    Status Achieved      PT SHORT TERM GOAL #2   Title Pt will demonstrate ability to tolerate end range L/S extension in standing in order to demonstrate functional improvement in lumbar ROM.    Time 4    Period Weeks    Status Achieved             PT Long Term  Goals - 08/13/20 1449      PT LONG TERM GOAL #1   Title Pt will demonstrate at least a 12.8 improvement in Oswestry Index in order to demonstrate a clinically significant change in LBP and function.    Time 6    Period Weeks    Status Achieved      PT LONG TERM GOAL  #2   Title Pt will be able to lift and squat at least 45 lbs in order to demonstrate functional improvement in LE and lumbopelvic motor control for full return to ADL and household duties.    Time 8    Period Weeks    Status Partially Met                 Plan - 08/13/20 1447    Clinical Impression Statement Pt demonstrates significant improvement with L/S ROM and pain. Pt also demonstrates improved ability to tolerate external resistance for core strengthening as well as lumbopelvic stability. Pt is no longer with pain and has reached PT goals. Pt given final HEP and has good knowledge of self progression. D/C this episode of care at this time.    Personal Factors and Comorbidities Age;Comorbidity 1;Comorbidity 2;Behavior Pattern;Fitness    Examination-Activity Limitations Lift;Squat;Stand;Bed Mobility;Caring for Others    Examination-Participation Restrictions Yard Work;Cleaning;Laundry;Community Activity    Stability/Clinical Decision Making Evolving/Moderate complexity    Rehab Potential Good    PT Frequency 2x / week    PT Duration 8 weeks    PT Treatment/Interventions ADLs/Self Care Home Management;Aquatic Therapy;Biofeedback;Electrical Stimulation;Cryotherapy;Moist Heat;Iontophoresis 5m/ml Dexamethasone;Traction;Stair training;Functional mobility training;Therapeutic activities;Therapeutic exercise;Balance training;Neuromuscular re-education;Patient/family education;Manual techniques;Scar mobilization;Passive range of motion;Dry needling;Taping;Spinal Manipulations;Joint Manipulations    PT Home Exercise Plan [email or web address]   Consulted and Agree with Plan of Care Patient           Patient will benefit from skilled therapeutic intervention in order to improve the following deficits and impairments:  Abnormal gait,Decreased endurance,Decreased mobility,Difficulty walking,Increased muscle spasms,Decreased range of motion,Decreased strength,Pain,Postural dysfunction  Visit  Diagnosis: Pain, lumbar region  Muscle weakness (generalized)     Problem List Patient Active Problem List   Diagnosis Date Noted  . PVC's (premature ventricular contractions) 02/12/2020  . Low TSH level 12/25/2019  . [place]ive heart failure) ([place] 12/24/2019  . Type 2 diabetes mellitus ([facility] 04/21/2016  . Hypertension associated with diabetes ([facility] 03/17/2016   ADaleen BoPT, DPT 08/13/20 2:51 PM   [place] [address]Phone: [phone]  Fax:  [phone] Name: Savannah CHILCOTEMRN: [id number] of Birth: 6Jul 22, 1967  PHYSICAL THERAPY DISCHARGE SUMMARY  Visits from Start of Care: 6  Plan:                                                    Patient goals were partially met. Patient is being discharged due to meeting the stated rehab goals.  ?????

## 2020-08-16 ENCOUNTER — Other Ambulatory Visit (HOSPITAL_COMMUNITY): Payer: Self-pay | Admitting: *Deleted

## 2020-08-16 ENCOUNTER — Other Ambulatory Visit: Payer: Self-pay

## 2020-08-16 ENCOUNTER — Encounter (HOSPITAL_COMMUNITY): Payer: Self-pay

## 2020-08-16 ENCOUNTER — Telehealth: Payer: Self-pay

## 2020-08-16 DIAGNOSIS — M79622 Pain in left upper arm: Secondary | ICD-10-CM

## 2020-08-16 MED ORDER — EPLERENONE 25 MG PO TABS: 25.0000 mg | ORAL_TABLET | Freq: Every day | ORAL | 3 refills | Status: DC

## 2020-08-16 NOTE — Telephone Encounter (Signed)
-----   Message from Ardith Dark, MD sent at 08/15/2020  3:02 PM EDT ----- Regarding: RE: PT Referral HI team. See below. Can we put ina  referral to PT for her to see Hessie Diener. Thanks!   Katina Degree. [name], MD 08/15/2020 3:03 PM   ----- Message ----- From: Zebedee Iba, PT Sent: 08/15/2020   2:31 PM EDT To: Ardith Dark, MD Subject: RE: PT Referral                                Hi Dr. [name],  I think just putting in the referral for PT would be it. Thank you!   ----- Message ----- From: Ardith Dark, MD Sent: 08/15/2020   2:28 PM EDT To: Zebedee Iba, PT Subject: RE: PT Referral                                Hi Hessie Diener that works for me!  Let me know if I need to do anything.  Katina Degree. [name], MD 08/15/2020 2:28 PM   ----- Message ----- From: Zebedee Iba, PT Sent: 08/14/2020   3:10 PM EDT To: Ardith Dark, MD Subject: PT Referral                                    Good afternoon Dr. [name],  I just saw Frances Nickels yesterday and D/C'd her LBP case. She mentions that she received a referral for her upper arm pain to Dr. Denyse Amass at the [facility] office. If you are agreeable, could she just get the referral to PT here? As she describes it, it appears to be a potential cervical radiculopathy related issue that I could begin to eval and treat. If it is not appropriate for PT, I will be sure to refer her onto Dr. Denyse Amass for further work up.   Just wanted to let you know that I would be comfortable seeing her first in order to save her the office visit to the sports MD, when she is likely to end up in PT anyways. Let me know if you have any questions or concerns.   Best, Dr. Zebedee Iba PT, DPT 08/14/20 3:08 PM

## 2020-08-16 NOTE — Telephone Encounter (Signed)
Referral placed.

## 2020-08-19 NOTE — Progress Notes (Signed)
I, [name], LAT, ATC, am serving as scribe for Dr. Clementeen Graham.  Savannah Morales is a 55 y.o. female who presents to [facility] today for L arm pain.  She was last seen by Dr. Denyse Amass on 07/15/20 for LBP f/u and noted 80% improvement.  Since then, pt reports new L arm pain x 2-3 weeks.  She locates her pain to anterior aspect of the upper arm.  She feels improved pain when she puts her arm over her head or reaches back.  She had some pain numbness and tingling extending into the volar wrist but not into the hand.  Previously prescribed Lyrica for lumbar radiculopathy not currently taking it for her neck.  Radiating pain: yes UE numbness/tingling: yes- across wrist Aggravating factors: driving, typing at work Treatments tried: meloxicam   Pertinent review of systems: No fevers or chills  Relevant historical information: Heart failure history   Exam:  BP 114/82 (BP Location: Right Arm, Patient Position: Sitting, Cuff Size: Normal)   Pulse 95   Ht 5\' 8"  (1.727 m)   Wt 184 lb (83.5 kg)   SpO2 95%   BMI 27.98 kg/m  General: Well Developed, well nourished, and in no acute distress.   MSK: C-spine normal-appearing nontender midline Normal cervical motion  Negative Spurling test. Upper extremity strength is intact with the exception of left deltoid which is weak 4/5. Reflexes and sensation are intact distally.  Left shoulder normal-appearing nontender normal motion Intact strength. Negative impingement testing. Minimally positive Yergason's and speeds test.  Pulses capillary fill and sensation are intact distally.   Lab and Radiology Results  X-ray images C-spine obtained today personally and independently interpreted Mild DDD C5-C6.  No fracture or malalignment.  Metallic object in right nostril.  Tongue piercing present. Await formal radiology review  Diagnostic Limited MSK Ultrasound of: Left shoulder and distal biceps Biceps tendon  intact normal-appearing Subscapularis tendon normal appearing Supraspinatus tendon intact normal. Mild subacromial bursa thickness. Infraspinatus tendon intact normal. Distal biceps muscle and muscle tendinous junction normal-appearing Impression: Normal-appearing left shoulder and biceps     Assessment and Plan: 55 y.o. female with left upper arm pain concerning for left C5 cervical radiculopathy.  She may have some component of biceps tendinitis as well. Plan for short course of prednisone and Lyrica. Additionally home exercise program for biceps tendinitis taught today.  Recheck back in 1 month.  Return sooner if needed.   PDMP not reviewed this encounter. Orders Placed This Encounter  Procedures  . 44 LIMITED JOINT SPACE STRUCTURES UP LEFT(NO LINKED CHARGES)    Standing Status:   Future    Number of Occurrences:   1    Standing Expiration Date:   02/19/2021    Order Specific Question:   Reason for Exam (SYMPTOM  OR DIAGNOSIS REQUIRED)    Answer:   left upper arm pain    Order Specific Question:   Preferred imaging location?    Answer:   02/21/2021 [facility]  . DG Cervical Spine 2 or 3 views    Standing Status:   Future    Number of Occurrences:   1    Standing Expiration Date:   08/20/2021    Order Specific Question:   Reason for Exam (SYMPTOM  OR DIAGNOSIS REQUIRED)    Answer:   eval left c5 rad    Order Specific Question:   Is patient pregnant?    Answer:   No  Order Specific Question:   Preferred imaging location?    Answer:   [facility]   Meds ordered this encounter  Medications  . predniSONE (DELTASONE) 10 MG tablet    Sig: Take 3 tablets (30 mg total) by mouth daily with breakfast.    Dispense:  15 tablet    Refill:  0     Discussed warning signs or symptoms. Please see discharge instructions. Patient expresses understanding.   The above documentation has been reviewed and is accurate and complete Clementeen Graham, M.D.

## 2020-08-20 ENCOUNTER — Other Ambulatory Visit: Payer: Self-pay

## 2020-08-20 ENCOUNTER — Ambulatory Visit (INDEPENDENT_AMBULATORY_CARE_PROVIDER_SITE_OTHER): Payer: 59

## 2020-08-20 ENCOUNTER — Ambulatory Visit: Payer: 59 | Admitting: Family Medicine

## 2020-08-20 ENCOUNTER — Ambulatory Visit: Payer: 59

## 2020-08-20 VITALS — BP 114/82 | HR 95 | Ht 68.0 in | Wt 184.0 lb

## 2020-08-20 DIAGNOSIS — M79622 Pain in left upper arm: Secondary | ICD-10-CM | POA: Diagnosis not present

## 2020-08-20 MED ORDER — PREDNISONE 10 MG PO TABS: 30.0000 mg | ORAL_TABLET | Freq: Every day | ORAL | 0 refills | Status: DC

## 2020-08-20 NOTE — Patient Instructions (Addendum)
Thank you for coming in today.  Please get an Xray today before you leave  Take the prednisone for 5 days.   Restart lyrica as needed.   Please complete the exercises that the athletic trainer went over with you: View at [email or web address] using code: [email or web address]  Recheck in 1 month or so.   Let me know if this is not working.    Cervical Radiculopathy  Cervical radiculopathy happens when a nerve in the neck (a cervical nerve) is pinched or bruised. This condition can happen because of an injury to the cervical spine (vertebrae) in the neck, or as part of the normal aging process. Pressure on the cervical nerves can cause pain or numbness that travels from the neck all the way down into the arm and fingers. Usually, this condition gets better with rest. Treatment may be needed if the condition does not improve. What are the causes? This condition may be caused by:  A neck injury.  A bulging (herniated) disk.  Muscle spasms.  Muscle tightness in the neck because of overuse.  Arthritis.  Breakdown or degeneration in the bones and joints of the spine (spondylosis) due to aging.  Bone spurs that may develop near the cervical nerves. What are the signs or symptoms? Symptoms of this condition include:  Pain. The pain may travel from the neck to the arm and hand. The pain can be severe or irritating. It may be worse when you move your neck.  Numbness or tingling in your arm or hand.  Weakness in the affected arm and hand, in severe cases. How is this diagnosed? This condition may be diagnosed based on your symptoms, your medical history, and a physical exam. You may also have tests, including:  X-rays.  A CT scan.  An MRI.  An electromyogram (EMG).  Nerve conduction tests. How is this treated? In many cases, treatment is not needed for this condition. With rest, the condition usually gets better over time. If treatment is needed, options may include:  Wearing a soft  neck collar (cervical collar) for short periods of time, as told by your health care provider.  Doing physical therapy to strengthen your neck muscles.  Taking medicines, such as NSAIDs or oral corticosteroids.  Having spinal injections, in severe cases.  Having surgery. This may be needed if other treatments do not help. Different types of surgery may be done depending on the cause of this condition. Follow these instructions at home: If you have a cervical collar:  Wear it as told by your health care provider. Remove it only as told by your health care provider.  Ask your health care provider if you can remove the collar for cleaning and bathing. If you are allowed to remove the collar for cleaning or bathing: ? Follow instructions from your health care provider about how to remove the collar safely. ? Clean the collar by wiping it with mild soap and water and drying it completely. ? Take out any removable pads in the collar every 1-2 days, and wash them by hand with soap and water. Let them air-dry completely before you put them back in the collar. ? Check your skin under the collar for irritation or sores. If you see any, tell your health care provider. Managing pain  Take over-the-counter and prescription medicines only as told by your health care provider.  If directed, put ice on the affected area. ? If you have a soft neck collar, remove it as told by your  health care provider. ? Put ice in a plastic bag. ? Place a towel between your skin and the bag. ? Leave the ice on for 20 minutes, 2-3 times a day.  If applying ice does not help, you can try using heat. Use the heat source that your health care provider recommends, such as a moist heat pack or a heating pad. ? Place a towel between your skin and the heat source. ? Leave the heat on for 20-30 minutes. ? Remove the heat if your skin turns bright red. This is especially important if you are unable to feel pain, heat, or cold.  You may have a greater risk of getting burned.  Try a gentle neck and shoulder massage to help relieve symptoms.      Activity  Rest as needed.  Return to your normal activities as told by your health care provider. Ask your health care provider what activities are safe for you.  Do stretching and strengthening exercises as told by your health care provider or physical therapist.  Do not lift anything that is heavier than 10 lb (4.5 kg) until your health care provider tells you that it is safe. General instructions  Use a flat pillow when you sleep.  Do not drive while wearing a cervical collar. If you do not have a cervical collar, ask your health care provider if it is safe to drive while your neck heals.  Ask your health care provider if the medicine prescribed to you requires you to avoid driving or using heavy machinery.  Do not use any products that contain nicotine or tobacco, such as cigarettes, e-cigarettes, and chewing tobacco. These can delay healing. If you need help quitting, ask your health care provider.  Keep all follow-up visits as told by your health care provider. This is important. Contact a health care provider if:  Your condition does not improve with treatment. Get help right away if:  Your pain gets much worse and cannot be controlled with medicines.  You have weakness or numbness in your hand, arm, face, or leg.  You have a high fever.  You have a stiff, rigid neck.  You lose control of your bowels or your bladder (have incontinence).  You have trouble with walking, balance, or speaking. Summary  Cervical radiculopathy happens when a nerve in the neck is pinched or bruised.  A nerve can get pinched from a bulging disk, arthritis, muscle spasms, or an injury to the neck.  Symptoms include pain, tingling, or numbness radiating from the neck into the arm or hand. Weakness can also occur in severe cases.  Treatment may include rest, wearing a  cervical collar, and physical therapy. Medicines may be prescribed to help with pain. In severe cases, injections or surgery may be needed. This information is not intended to replace advice given to you by your health care provider. Make sure you discuss any questions you have with your health care provider. Document Revised: 03/04/2018 Document Reviewed: 03/04/2018 Elsevier Patient Education  2021 [employer].

## 2020-08-21 ENCOUNTER — Telehealth: Payer: Self-pay | Admitting: Family Medicine

## 2020-08-21 DIAGNOSIS — M79622 Pain in left upper arm: Secondary | ICD-10-CM

## 2020-08-21 NOTE — Progress Notes (Signed)
Order in. BL Cspine Oblique

## 2020-08-21 NOTE — Telephone Encounter (Signed)
Patient had an unusual appearance on x-ray and the radiologist would like me to get 2 more views to further evaluate this area.  This is to see the facet joints at that better.  If this continues to be abnormal we will get a CT scan or MRI much more soon than we normally would.  Please contact patient and have her return to clinic for repeat x-ray

## 2020-08-21 NOTE — Telephone Encounter (Signed)
Called pt but unable to LM as her VM is full.  Will attempt to call pt again to advise her of need to get f/u XR at her convenience per Dr. Zollie Pee result note and pt call message.

## 2020-08-21 NOTE — Progress Notes (Signed)
X-ray cervical spine shows concern for the facet joints at C5 and C6 not lining up correctly.  This is the level of concern that could cause a pinched nerve where you are hurting.  Without a trauma history it would be very unlikely for the bones to be broken or dislocated here.  However we need to get some dedicated views of this with x-rays to see this better.  Please call my office and schedule follow-up x-rays.  If needed we could get a CT scan as well or even an MRI.  I think this is probably a situation where the limited x-rays that I ordered will be much more reassuring with dedicated views of these joints.

## 2020-08-21 NOTE — Telephone Encounter (Signed)
PT returned call, will come in at lunch next week for repeat xray.

## 2020-08-23 ENCOUNTER — Ambulatory Visit (INDEPENDENT_AMBULATORY_CARE_PROVIDER_SITE_OTHER): Payer: 59

## 2020-08-23 DIAGNOSIS — M79622 Pain in left upper arm: Secondary | ICD-10-CM

## 2020-08-24 ENCOUNTER — Other Ambulatory Visit: Payer: Self-pay | Admitting: Family Medicine

## 2020-08-24 DIAGNOSIS — E119 Type 2 diabetes mellitus without complications: Secondary | ICD-10-CM

## 2020-08-26 NOTE — Progress Notes (Signed)
Follow-up cervical spine x-rays look much more reassuring.  A little bit of arthritis is present.

## 2020-08-29 ENCOUNTER — Encounter: Payer: Self-pay | Admitting: Family Medicine

## 2020-08-29 ENCOUNTER — Other Ambulatory Visit: Payer: Self-pay

## 2020-09-03 ENCOUNTER — Ambulatory Visit: Payer: 59 | Admitting: Physical Therapy

## 2020-09-06 ENCOUNTER — Ambulatory Visit: Payer: 59 | Admitting: Physical Therapy

## 2020-09-06 ENCOUNTER — Encounter: Payer: Self-pay | Admitting: Physical Therapy

## 2020-09-06 ENCOUNTER — Other Ambulatory Visit: Payer: Self-pay

## 2020-09-06 DIAGNOSIS — M6281 Muscle weakness (generalized): Secondary | ICD-10-CM

## 2020-09-06 DIAGNOSIS — M79622 Pain in left upper arm: Secondary | ICD-10-CM

## 2020-09-06 DIAGNOSIS — M542 Cervicalgia: Secondary | ICD-10-CM | POA: Diagnosis not present

## 2020-09-06 NOTE — Therapy (Signed)
[facility] [address] [place], [place], [address] Phone: [phone]   Fax:  [phone]  Physical Therapy Evaluation  Patient Details  Name: Savannah Morales MRN: [id number] Date of Birth: [birth year] Referring Provider (PT): Dr. Denyse Amass   Encounter Date: 09/06/2020   PT End of Session - 09/06/20 1144    Visit Number 1    Number of Visits 13    Date for PT Re-Evaluation 11/05/20    Authorization Type [employer]    PT Start Time 1100    PT Stop Time 1145    PT Time Calculation (min) 45 min    Activity Tolerance Patient tolerated treatment well    Behavior During Therapy [facility] for tasks assessed/performed           Past Medical History:  Diagnosis Date  . CHF (congestive heart failure) (HCC)   . Diabetes mellitus without complication (HCC)    Type II  . History of chicken pox   . Hypertension   . Kidney stones     Past Surgical History:  Procedure Laterality Date  . ANKLE SURGERY    . PVC ABLATION N/A 01/31/2020   Procedure: PVC ABLATION;  Surgeon: Lanier Prude, MD;  Location: [facility] INVASIVE CV LAB;  Service: Cardiovascular;  Laterality: N/A;  . RIGHT/LEFT HEART CATH AND CORONARY ANGIOGRAPHY N/A 02/27/2020   Procedure: RIGHT/LEFT HEART CATH AND CORONARY ANGIOGRAPHY;  Surgeon: Dolores Patty, MD;  Location: [facility] INVASIVE CV LAB;  Service: Cardiovascular;  Laterality: N/A;    There were no vitals filed for this visit.    Subjective Assessment - 09/06/20 1104    Subjective Pt states that the pain started happening for over a month at this point. It hurts the worst during working (typing) and driving. Pt states that the pain takes about 2-3 hours for the pain to come on. She states the medication does seem to help. Pain comes down the upper arm. Pt states she has NT into the hand. She states the pain is sometimes sharp. Eases: stretching out UE, some HEP from Dr. Denyse Amass, heat. Pt states the hand wrist is weaker on  the L side. Pt states she had dropped a dog bowl because of the weakness in the hand. Pt states that she is a side sleeper on both sides. Pt denies red flags.     Limitations Standing;Walking;Sitting    How long can you sit comfortably? As long as she would like    How long can you stand comfortably? <1 hour    How long can you walk comfortably? 15 mins    Patient Stated Goals Pt states she would like to return to daily life without pain.    Currently in Pain? Yes    Pain Score 3     Pain Location Shoulder    Pain Orientation Left    Pain Descriptors / Indicators Nagging;Aching    Pain Type Chronic pain    Pain Onset More than a month ago    Effect of Pain on Daily Activities Difficulty with work and ADL              [facility] PT Assessment - 09/06/20 0001      Assessment   Medical Diagnosis L UE pain    Referring Provider (PT) Dr. Denyse Amass      Precautions   Precautions None      Restrictions   Weight Bearing Restrictions No      Home Environment  Living Environment Private residence      Prior Function   Level of Independence Independent      Cognition   Overall Cognitive Status Within Functional Limits for tasks assessed      Observation/Other Assessments   Other Surveys  Neck Disability Index    Oswestry Disability Index  34% 17/50      Sensation   Light Touch Impaired by gross assessment   diminished along C5 and C6                                AROM   AROM Assessment Site Cervical;Shoulder    Right/Left Shoulder Right;Left    Left Hip Internal Rotation  --   p!   Cervical Flexion WFL    Cervical Extension 80%    Cervical - Right Side Bend 50%    Cervical - Left Side Bend 65%    Cervical - Right Rotation 75%    Cervical - Left Rotation 50% causes UE pain                              Strength   Overall Strength Deficits    Overall Strength Comments R UE WFL, L UE fatiguing weakness at C5 and C8; Grip HHD R 60lbs, 70lbs, 75lbs (68 avg); L  40lbs, 50lbs, 50lbs (48 lb avg)      Flexibility   Soft Tissue Assessment /Muscle Length --    Hamstrings --    Quadriceps --      Palpation   Spinal mobility stiffness and joint rotation and extension restriction from C3-7    Palpation comment increased muscle tone on L, paraspinals      Special Tests    Special Tests Cervical    Cervical Tests Spurling's;Dictraction      Spurling's   Findings Positive    Side Left      Distraction Test   Findngs Positive          --          --          --          --                                         Pain with L rotation AROM overpressure           Objective measurements completed on examination: See above findings.       [facility] Adult PT Treatment/Exercise - 09/06/20 0001      Exercises   Exercises Neck      Neck Exercises: Seated   Neck Retraction 15 reps;5 secs    Other Seated Exercise scap squeeze 10x; seated T/S extension over towel roll 10x 5s; L UT stretch 30s 3x                  PT Education - 09/06/20 1143    Education Details MOI, posture, work Financial controller, diagnosis, prognosis, anatomy, exercise progression, muscle firing, HEP, POC    Person(s) Educated Patient    Methods Explanation;Demonstration;Tactile cues;Verbal cues;Handout    Comprehension Verbalized understanding;Returned demonstration;Verbal cues required;Tactile cues required            PT Short Term Goals - 09/06/20 1217  PT SHORT TERM GOAL #1   Title Pt will demonstrate independence with HEP in order to show synthesis of PT education and compliance.    Time 2    Period Days    Status New      PT SHORT TERM GOAL #2   Title Pt will demonstrate ability to tolerate end range C/S without pain in order to demonstrate functional improvement for driving, working, and self care.    Time 3    Period Weeks    Status New      PT SHORT TERM GOAL #3   Title Pt will demonstrate at least a 8 pt improvement in  Neck Disability Index in order to demonstrate a clinically significant change in neck function.             PT Long Term Goals - 09/06/20 1220      PT LONG TERM GOAL #1   Title Pt will demonstrate at least a 20% improvement in grip strength HHD in order to demonstrate a clinically significant change L UE function and strength for return to PLOF.    Time 6    Period Weeks    Status New      PT LONG TERM GOAL #2   Title Pt will be able to demonstrate/report ability to sit/work/drive for extended periods of time without pain in order to demonstrate functionl improvement and tolerance to static positioning.    Time 6    Period Weeks    Status New                  Plan - 09/06/20 1145    Clinical Impression Statement Pt is a 55 y.o. female presenting to PT eval today for CC of L UE pain. Pt presents with decrease C/S ROM, decreased cervical joint mobility, and myotomal weakness along C5 and C8. Pt's s/s appear consistent with a cervical radiculopathy. No atrophy noted but pt does have >29% deficits in grip strength on L compared to R. Pt is R hand dominant. Pt responded quickly in session to cervical retraction exercise as well as manual traction. Pt had abolishment of pain within session. Pt's impairments currently limit her ability to perform work related tasks, ADL, and driving. Pt would benefit from continued skilled therapy in order to reach goals and maximize functional L UE strength and  C/S ROM for full return to PLOF.    Personal Factors and Comorbidities Age;Comorbidity 1;Comorbidity 2;Behavior Pattern;Fitness    Examination-Activity Limitations Lift;Squat;Stand;Bed Mobility;Caring for Others    Examination-Participation Restrictions Yard Work;Cleaning;Laundry;Community Activity    Stability/Clinical Decision Making Evolving/Moderate complexity    Rehab Potential Excellent    PT Frequency 2x / week    PT Duration 6 weeks    PT Treatment/Interventions ADLs/Self Care Home  Management;Aquatic Therapy;Biofeedback;Electrical Stimulation;Cryotherapy;Moist Heat;Iontophoresis 4mg /ml Dexamethasone;Traction;Therapeutic activities;Therapeutic exercise;Neuromuscular re-education;Patient/family education;Manual techniques;Scar mobilization;Passive range of motion;Dry needling;Taping;Spinal Manipulations;Joint Manipulations;Ultrasound    PT Next Visit Plan review HEP, nerve glide, pec stretch, cervical traction manual    PT Home Exercise Plan [email or web address]- neck/shoulder pain HEP code    Consulted and Agree with Plan of Care Patient           Patient will benefit from skilled therapeutic intervention in order to improve the following deficits and impairments:  Abnormal gait,Decreased endurance,Decreased mobility,Difficulty walking,Increased muscle spasms,Decreased range of motion,Decreased strength,Pain,Postural dysfunction  Visit Diagnosis: Left upper arm pain  Cervicalgia  Muscle weakness (generalized)     Problem List Patient Active Problem List   Diagnosis  Date Noted  . PVC's (premature ventricular contractions) 02/12/2020  . Low TSH level 12/25/2019  . CHF (congestive heart failure) (HCC) 12/24/2019  . Type 2 diabetes mellitus (HCC) 04/21/2016  . Hypertension associated with diabetes (HCC) 03/17/2016    Zebedee Iba PT, DPT 09/06/20 12:35 PM   [facility] [address] [place], [place], [address] Phone: [phone]   Fax:  [phone]  Name: EMILYGRACE SALVAGE MRN: [id number] Date of Birth: 01/19/1966

## 2020-09-06 NOTE — Patient Instructions (Signed)
Access Code: [email or web address] URL: [email or web address] Date: 09/06/2020 Prepared by: Zebedee Iba  Exercises Seated Cervical Retraction - 3-4 x daily - 7 x weekly - 1 sets - 10 reps - 3s hold Seated Cervical Sidebending Stretch - 2 x daily - 7 x weekly - 1 sets - 3 reps - 30 hold Seated Thoracic Lumbar Extension - 2 x daily - 7 x weekly - 1 sets - 10 reps - 5 hold

## 2020-09-11 ENCOUNTER — Encounter: Payer: 59 | Admitting: Physical Therapy

## 2020-09-13 ENCOUNTER — Encounter: Payer: 59 | Admitting: Physical Therapy

## 2020-09-18 ENCOUNTER — Encounter (HOSPITAL_COMMUNITY): Payer: Self-pay

## 2020-09-18 NOTE — Progress Notes (Signed)
I, [name], LAT, ATC, am serving as scribe for Dr. Clementeen Graham.  Savannah Morales is a 55 y.o. female who presents to [facility] today for f/u of L anterior upper arm pain and L UE numbness/tingling.  She was last seen by Dr. Denyse Amass on 08/20/20 and was referred to PT of which she has completed 1 session.  She was also prescribed a short course of prednisone and advised to restart Lyrica.  Since her last visit, pt reports that she is feeling better, rating her improvement at 70%.  She states that she will be continuing PT and has been doing her HEP.  She will con't to get intermittent pain in her L upper arm when typing at work.  Diagnostic testing: C-spine XR- 08/23/20, 08/20/20  Pertinent review of systems: No fevers or chills  Relevant historical information: Hypertension and diabetes   Exam:  BP 100/62 (BP Location: Right Arm, Patient Position: Sitting, Cuff Size: Normal)   Pulse (!) 47   Ht 5\' 8"  (1.727 m)   Wt 185 lb 6.4 oz (84.1 kg)   SpO2 94%   BMI 28.19 kg/m  General: Well Developed, well nourished, and in no acute distress.   MSK:  C-spine: Normal. Nontender midline. Normal cervical motion. Upper extremity reflexes are intact Sensation is intact and Strength diminished in the left C5 4/5 otherwise intact throughout bilateral extremities.    Lab and Radiology Results  EXAM: CERVICAL SPINE - 2-3 VIEW  COMPARISON:  August 20, 2020  FINDINGS: The facets are thought to align appropriately in the appearance is thought to be due to rotation. No traumatic malalignment. Reversal of normal lordosis centered at C4. Minimal retrolisthesis of C4 versus C5, unchanged. No fractures identified. Degenerative disc disease seen at C4-5 and C5-6 with small anterior osteophytes. The pre odontoid space and prevertebral soft tissues are normal. Lateral masses of C1 align with C2. The odontoid process is normal. The lung apices are  normal.  IMPRESSION: 1. No jumped or perched facets identified. The appearance of the facets is likely due to rotation and the facets appear to articulate normally. Minimal retrolisthesis of C4 versus C5, stable. Degenerative disc disease, stable. Oblique views were not obtained to assess the neural foramina. No other abnormalities. If concern persists, CT or MRI could better evaluate.   Electronically Signed   By: August 22, 2020 III M.D   On: 08/26/2020 07:07 EXAM: CERVICAL SPINE - 2-3 VIEW  COMPARISON:  None.  FINDINGS: Three view radiograph cervical spine demonstrates malalignment of the facets on lateral examination without associated listhesis or focal angular deformity suggesting that this may simply relate to rotation, however, this is not optimally assessed on this exam. The abnormality develops at C5-6 and partial facetal dislocation is not completely excluded. No definite acute fracture. Vertebral body height has been preserved. There is intervertebral disc space narrowing and endplate remodeling at C4-5 and C5-6 as well as the disc calcification at C6-7 in keeping with changes of mild to moderate degenerative disc disease. The spinal canal is widely patent. The prevertebral soft tissues are not thickened. Lateral masses of C1 are well aligned with the body of C2.  IMPRESSION: Malalignment of the facet joints on lateral examination, possibly related to rotation though partial dislocation of the facets at C5-6 is not excluded on this examination. Bilateral oblique views or dedicated CT imaging of the cervical spine is recommended for further characterization.  These results will be called to the ordering  clinician or representative by the Radiologist Assistant, and communication documented in the PACS or [employer].   Electronically Signed   By: Helyn Numbers MD   On: 08/21/2020 03:06 I, Clementeen Graham, personally (independently) visualized and  performed the interpretation of the images attached in this note.    Assessment and Plan: 55 y.o. female with left cervical radiculopathy at C5.  Minimal left arm weakness today.  Overall patient is improving with course of prednisone, Lyrica, and physical therapy.  Plan to continue physical therapy for 1 more month.  If not improved in 1 month next step would be MRI for epidural steroid injection planning.  Patient will let me know.   Discussed warning signs or symptoms. Please see discharge instructions. Patient expresses understanding.   The above documentation has been reviewed and is accurate and complete Clementeen Graham, M.D.

## 2020-09-19 ENCOUNTER — Other Ambulatory Visit: Payer: Self-pay

## 2020-09-19 ENCOUNTER — Ambulatory Visit (INDEPENDENT_AMBULATORY_CARE_PROVIDER_SITE_OTHER): Payer: 59 | Admitting: Family Medicine

## 2020-09-19 ENCOUNTER — Encounter: Payer: Self-pay | Admitting: Family Medicine

## 2020-09-19 VITALS — BP 100/62 | HR 47 | Ht 68.0 in | Wt 185.4 lb

## 2020-09-19 DIAGNOSIS — M5412 Radiculopathy, cervical region: Secondary | ICD-10-CM | POA: Diagnosis not present

## 2020-09-19 NOTE — Patient Instructions (Signed)
Thank you for coming in today.  Recheck as needed.   If not improved or if worsening next step is MRI for possible injection planning.   Return as needed.   OK to continue lyrica as needed.

## 2020-09-20 ENCOUNTER — Ambulatory Visit: Payer: 59 | Admitting: Physical Therapy

## 2020-09-20 ENCOUNTER — Encounter: Payer: Self-pay | Admitting: Physical Therapy

## 2020-09-20 ENCOUNTER — Other Ambulatory Visit: Payer: Self-pay

## 2020-09-20 DIAGNOSIS — M542 Cervicalgia: Secondary | ICD-10-CM

## 2020-09-20 DIAGNOSIS — M6281 Muscle weakness (generalized): Secondary | ICD-10-CM

## 2020-09-20 DIAGNOSIS — M79622 Pain in left upper arm: Secondary | ICD-10-CM

## 2020-09-20 NOTE — Therapy (Addendum)
[facility] [address] [place], [place], [address] Phone: [phone]   Fax:  [phone]  Physical Therapy Treatment  PHYSICAL THERAPY DISCHARGE SUMMARY  Visits from Start of Care: 2  Plan: Patient agrees to discharge.  Patient goals were not met. Patient is being discharged due to not returning to therapy.      Patient Details  Name: Savannah Morales MRN: [id number] Date of Birth: April 23, 1966 Referring Provider (PT): Dr. Denyse Amass   Encounter Date: 09/20/2020   PT End of Session - 09/20/20 0914     Visit Number 2    Number of Visits 13    Date for PT Re-Evaluation 11/05/20    Authorization Type [employer]    PT Start Time [phone]    PT Stop Time 0930    PT Time Calculation (min) 41 min    Activity Tolerance Patient tolerated treatment well    Behavior During Therapy [facility] for tasks assessed/performed             Past Medical History:  Diagnosis Date   CHF (congestive heart failure) (HCC)    Diabetes mellitus without complication (HCC)    Type II   History of chicken pox    Hypertension    Kidney stones     Past Surgical History:  Procedure Laterality Date   ANKLE SURGERY     PVC ABLATION N/A 01/31/2020   Procedure: PVC ABLATION;  Surgeon: Lanier Prude, MD;  Location: [facility] INVASIVE CV LAB;  Service: Cardiovascular;  Laterality: N/A;   RIGHT/LEFT HEART CATH AND CORONARY ANGIOGRAPHY N/A 02/27/2020   Procedure: RIGHT/LEFT HEART CATH AND CORONARY ANGIOGRAPHY;  Surgeon: Dolores Patty, MD;  Location: [facility] INVASIVE CV LAB;  Service: Cardiovascular;  Laterality: N/A;    There were no vitals filed for this visit.   Subjective Assessment - 09/20/20 0854     Subjective Pt states she is doing well. She states she feels about 70% better. She states it only happens with keying for a few hours.    Limitations Standing;Walking;Sitting    How long can you sit comfortably? As long as she would like    How long can  you stand comfortably? <1 hour    How long can you walk comfortably? 15 mins    Patient Stated Goals Pt states she would like to return to daily life without pain.    Currently in Pain? No/denies    Pain Score 0-No pain    Pain Onset More than a month ago                               [facility] Adult PT Treatment/Exercise - 09/20/20 0001       Exercises   Exercises Neck      Neck Exercises: Standing   Other Standing Exercises bilateral ER RTB 20x; standing row 2x10 RTB focus on scap retraction      Neck Exercises: Seated   Neck Retraction 15 reps;5 secs    Other Seated Exercise scap squeeze 10x; seated T/S extension over towel roll 10x 5s; L UT stretch 30s 3x                    PT Education - 09/20/20 0913     Education Details posture, work Financial controller, pain prevention, anatomy, exercise progression, muscle firing, HEP    Person(s) Educated Patient    Methods Explanation;Demonstration;Tactile cues;Verbal cues;Handout  Comprehension Verbalized understanding;Returned demonstration;Verbal cues required;Tactile cues required              PT Short Term Goals - 09/06/20 1217       PT SHORT TERM GOAL #1   Title Pt will demonstrate independence with HEP in order to show synthesis of PT education and compliance.    Time 2    Period Days    Status New      PT SHORT TERM GOAL #2   Title Pt will demonstrate ability to tolerate end range C/S without pain in order to demonstrate functional improvement for driving, working, and self care.    Time 3    Period Weeks    Status New      PT SHORT TERM GOAL #3   Title Pt will demonstrate at least a 8 pt improvement in Neck Disability Index in order to demonstrate a clinically significant change in neck function.               PT Long Term Goals - 09/06/20 1220       PT LONG TERM GOAL #1   Title Pt will demonstrate at least a 20% improvement in grip strength HHD in order to demonstrate a  clinically significant change L UE function and strength for return to PLOF.    Time 6    Period Weeks    Status New      PT LONG TERM GOAL #2   Title Pt will be able to demonstrate/report ability to sit/work/drive for extended periods of time without pain in order to demonstrate functionl improvement and tolerance to static positioning.    Time 6    Period Weeks    Status New                   Plan - 09/20/20 0915     Clinical Impression Statement Pt had improved C/S extension mobility after manual therapy, more restriction in rotation today on L. Pt demonstrated good performance of HEP and was able to incoporate postural strengthening exercises without pain. Plan to perform chin tucks against gravity at next session. Pt had no symptoms throughout session. Pt would benefit from continued skilled therapy in order to reach goals and maximize functional L UE strength and C/S ROM for full return to PLOF.    Personal Factors and Comorbidities Age;Comorbidity 1;Comorbidity 2;Behavior Pattern;Fitness    Examination-Activity Limitations Lift;Squat;Stand;Bed Mobility;Caring for Others    Examination-Participation Restrictions Yard Work;Cleaning;Laundry;Community Activity    Stability/Clinical Decision Making Evolving/Moderate complexity    Rehab Potential Excellent    PT Frequency 2x / week    PT Duration 6 weeks    PT Treatment/Interventions ADLs/Self Care Home Management;Aquatic Therapy;Biofeedback;Electrical Stimulation;Cryotherapy;Moist Heat;Iontophoresis 4mg /ml Dexamethasone;Traction;Therapeutic activities;Therapeutic exercise;Neuromuscular re-education;Patient/family education;Manual techniques;Scar mobilization;Passive range of motion;Dry needling;Taping;Spinal Manipulations;Joint Manipulations;Ultrasound    PT Next Visit Plan review HEP, nerve glide, pec stretch, cervical traction manual    PT Home Exercise Plan [email or web address]- neck/shoulder pain HEP code    Consulted and Agree with Plan  of Care Patient             Patient will benefit from skilled therapeutic intervention in order to improve the following deficits and impairments:  Abnormal gait,Decreased endurance,Decreased mobility,Difficulty walking,Increased muscle spasms,Decreased range of motion,Decreased strength,Pain,Postural dysfunction  Visit Diagnosis: Left upper arm pain  Cervicalgia  Muscle weakness (generalized)     Problem List Patient Active Problem List   Diagnosis Date Noted   PVC's (premature ventricular contractions) 02/12/2020  Low TSH level 12/25/2019   CHF (congestive heart failure) (HCC) 12/24/2019   Type 2 diabetes mellitus (HCC) 04/21/2016   Hypertension associated with diabetes (HCC) 03/17/2016   Zebedee Iba PT, DPT 09/20/20 10:19 PM   [facility] [address] [place], [place], [address] Phone: [phone]   Fax:  [phone]  Name: Savannah Morales MRN: [id number] Date of Birth: [birth year]

## 2020-09-20 NOTE — Patient Instructions (Addendum)
Access Code: [email or web address] URL: [email or web address] Date: 09/20/2020 Prepared by: Zebedee Iba  Exercises Seated Cervical Retraction - 3-4 x daily - 7 x weekly - 1 sets - 10 reps - 3s hold Seated Cervical Sidebending Stretch - 2 x daily - 7 x weekly - 1 sets - 3 reps - 30 hold Seated Thoracic Lumbar Extension - 2 x daily - 7 x weekly - 1 sets - 10 reps - 5 hold Shoulder External Rotation and Scapular Retraction with Resistance - 2 x daily - 7 x weekly - 2 sets - 10 reps Standing Shoulder Row with Anchored Resistance - 1 x daily - 7 x weekly - 2 sets - 10 reps

## 2020-10-04 ENCOUNTER — Encounter: Payer: 59 | Admitting: Physical Therapy

## 2020-10-14 ENCOUNTER — Other Ambulatory Visit (HOSPITAL_COMMUNITY): Payer: Self-pay | Admitting: Unknown Physician Specialty

## 2020-10-14 MED ORDER — METOPROLOL SUCCINATE ER 50 MG PO TB24: 50.0000 mg | ORAL_TABLET | Freq: Every day | ORAL | 3 refills | Status: DC

## 2020-10-22 LAB — HM DIABETES EYE EXAM

## 2020-11-13 ENCOUNTER — Encounter: Payer: Self-pay | Admitting: Family Medicine

## 2020-11-14 ENCOUNTER — Encounter: Payer: 59 | Admitting: Family Medicine

## 2020-11-21 ENCOUNTER — Other Ambulatory Visit (HOSPITAL_COMMUNITY): Payer: Self-pay | Admitting: Internal Medicine

## 2020-11-21 LAB — HM PAP SMEAR: HM Pap smear: NEGATIVE

## 2020-11-21 LAB — OB RESULTS CONSOLE GC/CHLAMYDIA: Chlamydia: NEGATIVE

## 2020-11-22 ENCOUNTER — Encounter: Payer: Self-pay | Admitting: Family Medicine

## 2020-11-22 ENCOUNTER — Ambulatory Visit (INDEPENDENT_AMBULATORY_CARE_PROVIDER_SITE_OTHER): Payer: 59 | Admitting: Family Medicine

## 2020-11-22 ENCOUNTER — Other Ambulatory Visit: Payer: Self-pay

## 2020-11-22 VITALS — BP 112/70 | HR 78 | Temp 98.0°F | Ht 68.0 in | Wt 186.0 lb

## 2020-11-22 DIAGNOSIS — E119 Type 2 diabetes mellitus without complications: Secondary | ICD-10-CM

## 2020-11-22 DIAGNOSIS — Z13220 Encounter for screening for lipoid disorders: Secondary | ICD-10-CM | POA: Diagnosis not present

## 2020-11-22 DIAGNOSIS — Z1211 Encounter for screening for malignant neoplasm of colon: Secondary | ICD-10-CM

## 2020-11-22 DIAGNOSIS — E1159 Type 2 diabetes mellitus with other circulatory complications: Secondary | ICD-10-CM

## 2020-11-22 DIAGNOSIS — Z0001 Encounter for general adult medical examination with abnormal findings: Secondary | ICD-10-CM

## 2020-11-22 DIAGNOSIS — R7989 Other specified abnormal findings of blood chemistry: Secondary | ICD-10-CM | POA: Diagnosis not present

## 2020-11-22 DIAGNOSIS — I152 Hypertension secondary to endocrine disorders: Secondary | ICD-10-CM

## 2020-11-22 LAB — COMPREHENSIVE METABOLIC PANEL
ALT: 18 U/L (ref 0–35)
AST: 15 U/L (ref 0–37)
Albumin: 4.2 g/dL (ref 3.5–5.2)
Alkaline Phosphatase: 53 U/L (ref 39–117)
BUN: 17 mg/dL (ref 6–23)
CO2: 27 mEq/L (ref 19–32)
Calcium: 9.6 mg/dL (ref 8.4–10.5)
Chloride: 104 mEq/L (ref 96–112)
Creatinine, Ser: 0.66 mg/dL (ref 0.40–1.20)
GFR: 100.39 mL/min (ref >60.00)
Glucose, Bld: 62 mg/dL — ABNORMAL LOW (ref 70–99)
Potassium: 4.1 mEq/L (ref 3.5–5.1)
Sodium: 139 mEq/L (ref 135–145)
Total Bilirubin: 0.3 mg/dL (ref 0.2–1.2)
Total Protein: 7.1 g/dL (ref 6.0–8.3)

## 2020-11-22 LAB — CBC
HCT: 39.5 % (ref 36.0–46.0)
Hemoglobin: 12.8 g/dL (ref 12.0–15.0)
MCHC: 32.6 g/dL (ref 30.0–36.0)
MCV: 84.7 fl (ref 78.0–100.0)
Platelets: 165 10*3/uL (ref 150.0–400.0)
RBC: 4.66 Mil/uL (ref 3.87–5.11)
RDW: 14.3 % (ref 11.5–15.5)
WBC: 3.6 10*3/uL — ABNORMAL LOW (ref 4.0–10.5)

## 2020-11-22 LAB — LIPID PANEL
Cholesterol: 157 mg/dL (ref 0–200)
HDL: 59.4 mg/dL (ref >39.00)
LDL Cholesterol: 74 mg/dL (ref 0–99)
NonHDL: 97.99
Total CHOL/HDL Ratio: 3
Triglycerides: 120 mg/dL (ref 0.0–149.0)
VLDL: 24 mg/dL (ref 0.0–40.0)

## 2020-11-22 LAB — HEMOGLOBIN A1C: Hgb A1c MFr Bld: 6.3 % (ref 4.6–6.5)

## 2020-11-22 LAB — TSH: TSH: 0.61 u[IU]/mL (ref 0.35–5.50)

## 2020-11-22 NOTE — Assessment & Plan Note (Signed)
Check A1c.  She has stopped Jardiance due to side effects.  We will continue metformin 500 mg daily for now.

## 2020-11-22 NOTE — Progress Notes (Signed)
Chief Complaint:  Savannah Morales is a 55 y.o. female who presents today for her annual comprehensive physical exam.    Assessment/Plan:  New/Acute Problems: Elbow pain Possibly mild olecranon bursitis.  Symptoms have resolved at this point.  We will continue with watchful waiting.  Discussed activities to avoid.  Chronic Problems Addressed Today: Low TSH level Check TSH.  Type 2 diabetes mellitus (HCC) Check A1c.  She has stopped Jardiance due to side effects.  We will continue metformin 500 mg daily for now.  Hypertension associated with diabetes (HCC) At goal on metoprolol succinate 50 mg daily.  Check labs today.   Body mass index is 28.28 kg/m. / Overweight  BMI Metric Follow Up - 11/22/20 1323       BMI Metric Follow Up-Please document annually   BMI Metric Follow Up Education provided              Preventative Healthcare: Up-to-date on vaccines.  Had Pap and mammogram done yesterday.  Will order Cologuard today.  Check labs today.  Patient Counseling(The following topics were reviewed and/or handout was given):  -Nutrition: Stressed importance of moderation in sodium/caffeine intake, saturated fat and cholesterol, caloric balance, sufficient intake of fresh fruits, vegetables, and fiber.  -Stressed the importance of regular exercise.   -Substance Abuse: Discussed cessation/primary prevention of tobacco, alcohol, or other drug use; driving or other dangerous activities under the influence; availability of treatment for abuse.   -Injury prevention: Discussed safety belts, safety helmets, smoke detector, smoking near bedding or upholstery.   -Sexuality: Discussed sexually transmitted diseases, partner selection, use of condoms, avoidance of unintended pregnancy and contraceptive alternatives.   -Dental health: Discussed importance of regular tooth brushing, flossing, and dental visits.  -Health maintenance and immunizations reviewed. Please refer to Health  maintenance section.  Return to care in 1 year for next preventative visit.     Subjective:  HPI:  She has no acute complaints today.   She occasionally gets pain to her right elbow.  She thinks it may be due to texting.  Worse with certain motions.  Worse when she keeps her elbow flexed.  Pain does not radiate.  Located on the point of her elbow.  Lifestyle Diet: Balanced. Plenty of fruits and vegetables.  Exercise: Likes walking.   Depression screen PHQ 2/9 11/22/2020  Decreased Interest 0  Down, Depressed, Hopeless 0  PHQ - 2 Score 0    Health Maintenance Due  Topic Date Due   Hepatitis C Screening  Never done   Zoster Vaccines- Shingrix (1 of 2) Never done   FOOT EXAM  11/10/2019   HEMOGLOBIN A1C  11/04/2020     ROS: Per HPI, otherwise a complete review of systems was negative.   PMH:  The following were reviewed and entered/updated in epic: Past Medical History:  Diagnosis Date   CHF (congestive heart failure) (HCC)    Diabetes mellitus without complication (HCC)    Type II   History of chicken pox    Hypertension    Kidney stones    Patient Active Problem List   Diagnosis Date Noted   PVC's (premature ventricular contractions) 02/12/2020   Low TSH level 12/25/2019   CHF (congestive heart failure) (HCC) 12/24/2019   Type 2 diabetes mellitus (HCC) 04/21/2016   Hypertension associated with diabetes (HCC) 03/17/2016   Past Surgical History:  Procedure Laterality Date   ANKLE SURGERY     PVC ABLATION N/A 01/31/2020   Procedure: PVC ABLATION;  Surgeon:  Lanier Prude, MD;  Location: [facility] INVASIVE CV LAB;  Service: Cardiovascular;  Laterality: N/A;   RIGHT/LEFT HEART CATH AND CORONARY ANGIOGRAPHY N/A 02/27/2020   Procedure: RIGHT/LEFT HEART CATH AND CORONARY ANGIOGRAPHY;  Surgeon: Dolores Patty, MD;  Location: [facility] INVASIVE CV LAB;  Service: Cardiovascular;  Laterality: N/A;    Family History  Problem Relation Age of Onset   Hyperlipidemia Mother     Hypertension Father    Diabetes Father    Cancer Paternal Grandmother     Medications- reviewed and updated Current Outpatient Medications  Medication Sig Dispense Refill   benzoyl peroxide-erythromycin (BENZAMYCIN) gel Apply 1 application topically daily.      ELDERBERRY PO Take 2 tablets by mouth daily.      ENTRESTO 49-51 MG TAKE 1 TABLET BY MOUTH TWICE DAILY 60 tablet 3   ferrous sulfate 324 MG TBEC Take 324 mg by mouth.     furosemide (LASIX) 40 MG tablet Take 1 tablet (40 mg total) by mouth every other day. 90 tablet 1   meloxicam (MOBIC) 15 MG tablet Take 1 tablet (15 mg total) by mouth daily. 30 tablet 0   metFORMIN (GLUCOPHAGE-XR) 500 MG 24 hr tablet TAKE 1 TABLET BY MOUTH DAILY WITH BREAKFAST 60 tablet 2   metoprolol succinate (TOPROL-XL) 50 MG 24 hr tablet Take 1 tablet (50 mg total) by mouth daily. Take with or immediately following a meal. 90 tablet 3   Multiple Vitamin (MULTIVITAMIN) capsule Take 1 capsule by mouth daily.     Omega-3 Fatty Acids (FISH OIL) 1200 MG CPDR Take 2,400 capsules by mouth daily.      Potassium Chloride ER 20 MEQ TBCR Take 20 mEq by mouth every other day. 90 tablet 1   pregabalin (LYRICA) 75 MG capsule Take 1 capsule (75 mg total) by mouth 2 (two) times daily as needed. 60 capsule 3   No current facility-administered medications for this visit.    Allergies-reviewed and updated No Known Allergies  Social History   Socioeconomic History   Marital status: Divorced    Spouse name: Not on file   Number of children: 3   Years of education: 14   Highest education level: Not on file  Occupational History   Occupation: Actor   Tobacco Use   Smoking status: Never   Smokeless tobacco: Never  Vaping Use   Vaping Use: Never used  Substance and Sexual Activity   Alcohol use: Not Currently    Comment: Rarely   Drug use: No   Sexual activity: Yes  Other Topics Concern   Not on file  Social History Narrative   Not on  file   Social Determinants of Health   Financial Resource Strain: Not on file  Food Insecurity: No Food Insecurity   Worried About Running Out of Food in the Last Year: Never true   Ran Out of Food in the Last Year: Never true  Transportation Needs: No Transportation Needs   Lack of Transportation (Medical): No   Lack of Transportation (Non-Medical): No  Physical Activity: Not on file  Stress: Not on file  Social Connections: Not on file        Objective:  Physical Exam: BP 112/70   Pulse 78   Temp 98 F (36.7 C) (Temporal)   Ht 5\' 8"  (1.727 m)   Wt 186 lb (84.4 kg)   SpO2 99%   BMI 28.28 kg/m   Body mass index is 28.28 kg/m. Wt Readings from Last  3 Encounters:  11/22/20 186 lb (84.4 kg)  09/19/20 185 lb 6.4 oz (84.1 kg)  08/20/20 184 lb (83.5 kg)   Gen: NAD, resting comfortably HEENT: TMs normal bilaterally. OP clear. No thyromegaly noted.  CV: RRR with no murmurs appreciated Pulm: NWOB, CTAB with no crackles, wheezes, or rhonchi GI: Normal bowel sounds present. Soft, Nontender, Nondistended. MSK: no edema, cyanosis, or clubbing noted Skin: warm, dry Neuro: CN2-12 grossly intact. Strength 5/5 in upper and lower extremities. Reflexes symmetric and intact bilaterally.  Psych: Normal affect and thought content     Savannah Dolinar M. [name], MD 11/22/2020 1:24 PM

## 2020-11-22 NOTE — Assessment & Plan Note (Signed)
Check TSH 

## 2020-11-22 NOTE — Assessment & Plan Note (Signed)
At goal on metoprolol succinate 50 mg daily.  Check labs today.

## 2020-11-22 NOTE — Patient Instructions (Signed)
It was very nice to see you today!  Not yet we will check blood work today.  We will also order Cologuard.  I will probably see back in 6 months depending on results of your blood work.  Please come back to see me sooner if needed.  Take care, Dr Jerline Pain  PLEASE NOTE:  If you had any lab tests please let us know if you have not heard back within a few days. You may see your results on mychart before we have a chance to review them but we will give you a call once they are reviewed by [place]. If we ordered any referrals today, please let us know if you have not heard from their office within the next week.   Please try these tips to maintain a healthy lifestyle:  Eat at least 3 REAL meals and 1-2 snacks per day.  Aim for no more than 5 hours between eating.  If you eat breakfast, please do so within one hour of getting up.   Each meal should contain half fruits/vegetables, one quarter protein, and one quarter carbs (no bigger than a computer mouse)  Cut down on sweet beverages. This includes juice, soda, and sweet tea.   Drink at least 1 glass of water with each meal and aim for at least 8 glasses per day  Exercise at least 150 minutes every week.   Preventive Care 53-66 Years Old, Female Preventive care refers to lifestyle choices and visits with your health care provider that can promote health and wellness. This includes: A yearly physical exam. This is also called an annual wellness visit. Regular dental and eye exams. Immunizations. Screening for certain conditions. Healthy lifestyle choices, such as: Eating a healthy diet. Getting regular exercise. Not using drugs or products that contain nicotine and tobacco. Limiting alcohol use. What can I expect for my preventive care visit? Physical exam Your health care provider will check your: Height and weight. These may be used to calculate your BMI (body mass index). BMI is a measurement that tells if you are at a healthy  weight. Heart rate and blood pressure. Body temperature. Skin for abnormal spots. Counseling Your health care provider may ask you questions about your: Past medical problems. Family's medical history. Alcohol, tobacco, and drug use. Emotional well-being. Home life and relationship well-being. Sexual activity. Diet, exercise, and sleep habits. Work and work Statistician. Access to firearms. Method of birth control. Menstrual cycle. Pregnancy history. What immunizations do I need?  Vaccines are usually given at various ages, according to a schedule. Your health care provider will recommend vaccines for you based on your age, medicalhistory, and lifestyle or other factors, such as travel or where you work. What tests do I need? Blood tests Lipid and cholesterol levels. These may be checked every 5 years, or more often if you are over 53 years old. Hepatitis C test. Hepatitis B test. Screening Lung cancer screening. You may have this screening every year starting at age 53 if you have a 30-pack-year history of smoking and currently smoke or have quit within the past 15 years. Colorectal cancer screening. All adults should have this screening starting at age 53 and continuing until age 5. Your health care provider may recommend screening at age 53 if you are at increased risk. You will have tests every 1-10 years, depending on your results and the type of screening test. Diabetes screening. This is done by checking your blood sugar (glucose) after you have not eaten  for a while (fasting). You may have this done every 1-3 years. Mammogram. This may be done every 1-2 years. Talk with your health care provider about when you should start having regular mammograms. This may depend on whether you have a family history of breast cancer. BRCA-related cancer screening. This may be done if you have a family history of breast, ovarian, tubal, or peritoneal cancers. Pelvic exam and Pap  test. This may be done every 3 years starting at age 53. Starting at age 19, this may be done every 5 years if you have a Pap test in combination with an HPV test. Other tests STD (sexually transmitted disease) testing, if you are at risk. Bone density scan. This is done to screen for osteoporosis. You may have this scan if you are at high risk for osteoporosis. Talk with your health care provider about your test results, treatment options,and if necessary, the need for more tests. Follow these instructions at home: Eating and drinking  Eat a diet that includes fresh fruits and vegetables, whole grains, lean protein, and low-fat dairy products. Take vitamin and mineral supplements as recommended by your health care provider. Do not drink alcohol if: Your health care provider tells you not to drink. You are pregnant, may be pregnant, or are planning to become pregnant. If you drink alcohol: Limit how much you have to 0-1 drink a day. Be aware of how much alcohol is in your drink. In the [place] one drink equals one 12 oz bottle of beer (355 mL), one 5 oz glass of wine (148 mL), or one 1 oz glass of hard liquor (44 mL).  Lifestyle Take daily care of your teeth and gums. Brush your teeth every morning and night with fluoride toothpaste. Floss one time each day. Stay active. Exercise for at least 30 minutes 5 or more days each week. Do not use any products that contain nicotine or tobacco, such as cigarettes, e-cigarettes, and chewing tobacco. If you need help quitting, ask your health care provider. Do not use drugs. If you are sexually active, practice safe sex. Use a condom or other form of protection to prevent STIs (sexually transmitted infections). If you do not wish to become pregnant, use a form of birth control. If you plan to become pregnant, see your health care provider for a prepregnancy visit. If told by your health care provider, take low-dose aspirin daily starting at age  53. Find healthy ways to cope with stress, such as: Meditation, yoga, or listening to music. Journaling. Talking to a trusted person. Spending time with friends and family. Safety Always wear your seat belt while driving or riding in a vehicle. Do not drive: If you have been drinking alcohol. Do not ride with someone who has been drinking. When you are tired or distracted. While texting. Wear a helmet and other protective equipment during sports activities. If you have firearms in your house, make sure you follow all gun safety procedures. What's next? Visit your health care provider once a year for an annual wellness visit. Ask your health care provider how often you should have your eyes and teeth checked. Stay up to date on all vaccines. This information is not intended to replace advice given to you by your health care provider. Make sure you discuss any questions you have with your healthcare provider. Document Revised: 01/16/2020 Document Reviewed: 12/23/2017 Elsevier Patient Education  2022 [employer].

## 2020-11-24 ENCOUNTER — Encounter (HOSPITAL_COMMUNITY): Payer: Self-pay

## 2020-11-25 ENCOUNTER — Telehealth: Payer: Self-pay

## 2020-11-25 NOTE — Progress Notes (Signed)
Please inform patient of the following:  Labs are all stable.  She can stay off of Jardiance.  I would like to see her back in 6 months to recheck A1c.

## 2020-11-25 NOTE — Telephone Encounter (Signed)
Savannah Morales spoke with pt.

## 2020-11-25 NOTE — Telephone Encounter (Signed)
Lmtcb.

## 2020-11-25 NOTE — Telephone Encounter (Signed)
Patient returned call regarding lab results

## 2020-12-02 ENCOUNTER — Other Ambulatory Visit: Payer: Self-pay

## 2020-12-02 ENCOUNTER — Ambulatory Visit (HOSPITAL_COMMUNITY)
Admission: RE | Admit: 2020-12-02 | Discharge: 2020-12-02 | Disposition: A | Discharge status: Home / Self Care | Payer: 59 | Source: Ambulatory Visit | Attending: Internal Medicine | Admitting: Internal Medicine

## 2020-12-02 ENCOUNTER — Ambulatory Visit (HOSPITAL_BASED_OUTPATIENT_CLINIC_OR_DEPARTMENT_OTHER)
Admission: RE | Admit: 2020-12-02 | Discharge: 2020-12-02 | Disposition: A | Discharge status: Home / Self Care | Payer: 59 | Source: Ambulatory Visit | Attending: Internal Medicine | Admitting: Internal Medicine

## 2020-12-02 ENCOUNTER — Encounter (HOSPITAL_COMMUNITY): Payer: Self-pay | Admitting: Internal Medicine

## 2020-12-02 VITALS — BP 108/70 | HR 72 | Wt 189.8 lb

## 2020-12-02 DIAGNOSIS — I493 Ventricular premature depolarization: Secondary | ICD-10-CM | POA: Diagnosis not present

## 2020-12-02 DIAGNOSIS — I340 Nonrheumatic mitral (valve) insufficiency: Secondary | ICD-10-CM | POA: Diagnosis not present

## 2020-12-02 DIAGNOSIS — I110 Hypertensive heart disease with heart failure: Secondary | ICD-10-CM | POA: Diagnosis not present

## 2020-12-02 DIAGNOSIS — I5042 Chronic combined systolic (congestive) and diastolic (congestive) heart failure: Secondary | ICD-10-CM | POA: Diagnosis present

## 2020-12-02 DIAGNOSIS — I5022 Chronic systolic (congestive) heart failure: Secondary | ICD-10-CM

## 2020-12-02 DIAGNOSIS — Z8249 Family history of ischemic heart disease and other diseases of the circulatory system: Secondary | ICD-10-CM | POA: Diagnosis not present

## 2020-12-02 DIAGNOSIS — Z79899 Other long term (current) drug therapy: Secondary | ICD-10-CM | POA: Insufficient documentation

## 2020-12-02 DIAGNOSIS — E119 Type 2 diabetes mellitus without complications: Secondary | ICD-10-CM | POA: Insufficient documentation

## 2020-12-02 DIAGNOSIS — I428 Other cardiomyopathies: Secondary | ICD-10-CM | POA: Diagnosis not present

## 2020-12-02 DIAGNOSIS — Z833 Family history of diabetes mellitus: Secondary | ICD-10-CM | POA: Insufficient documentation

## 2020-12-02 DIAGNOSIS — Z7984 Long term (current) use of oral hypoglycemic drugs: Secondary | ICD-10-CM | POA: Diagnosis not present

## 2020-12-02 LAB — ECHOCARDIOGRAM COMPLETE
Area-P 1/2: 6.07 cm2
MV M vel: 4.53 m/s
MV Peak grad: 82.1 mmHg
Radius: 0.3 cm
S' Lateral: 4.7 cm

## 2020-12-02 IMAGING — DX DG CHEST 2V
2 series · 2 of 2 positions shown · non-contrast
Comparison: None

CLINICAL DATA: Shortness of breath for 1 month complaining by dry
cough, hypertension controlled by medication, diabetes mellitus on
metformin

EXAM:
CHEST - 2 VIEW

[chest pa]
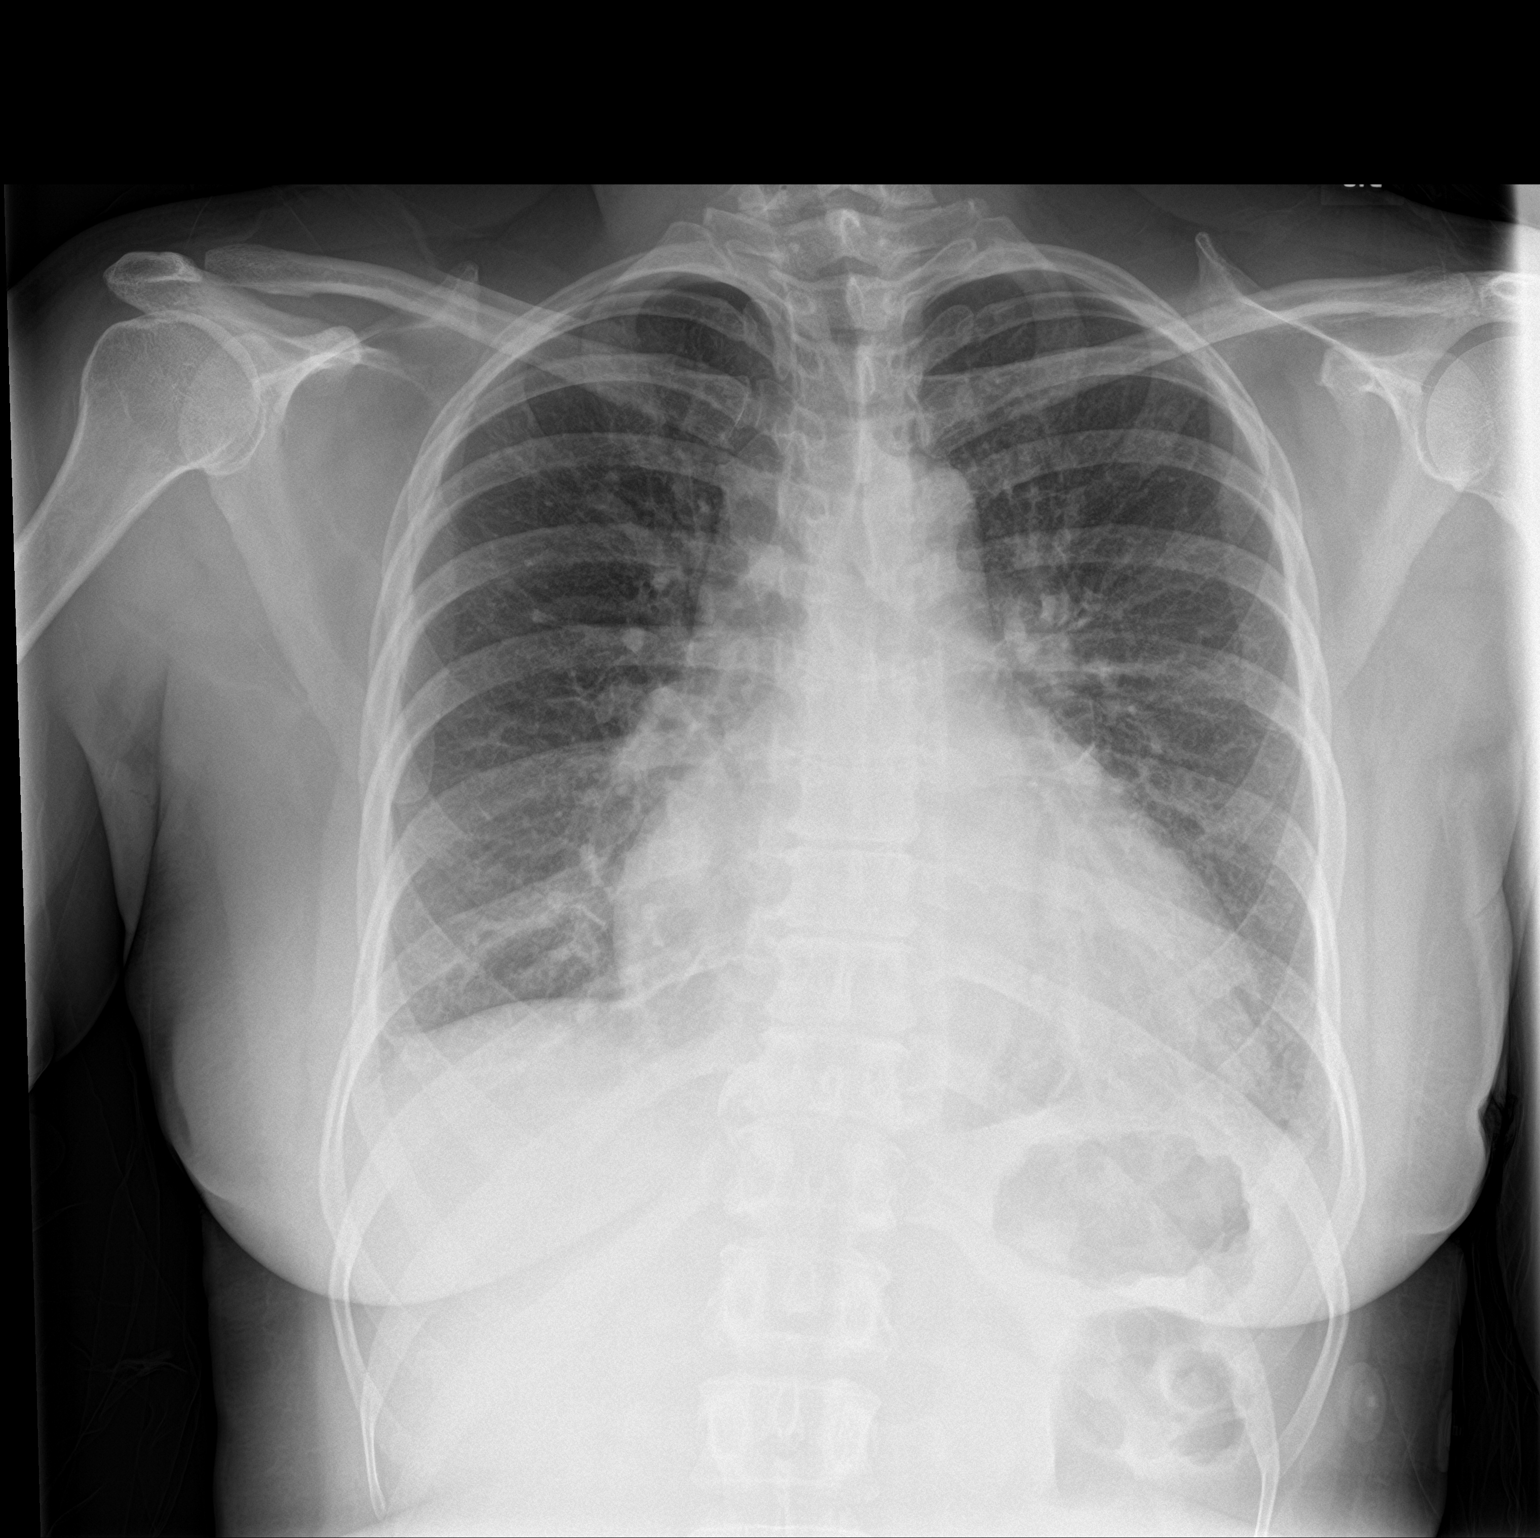

[chest lat]
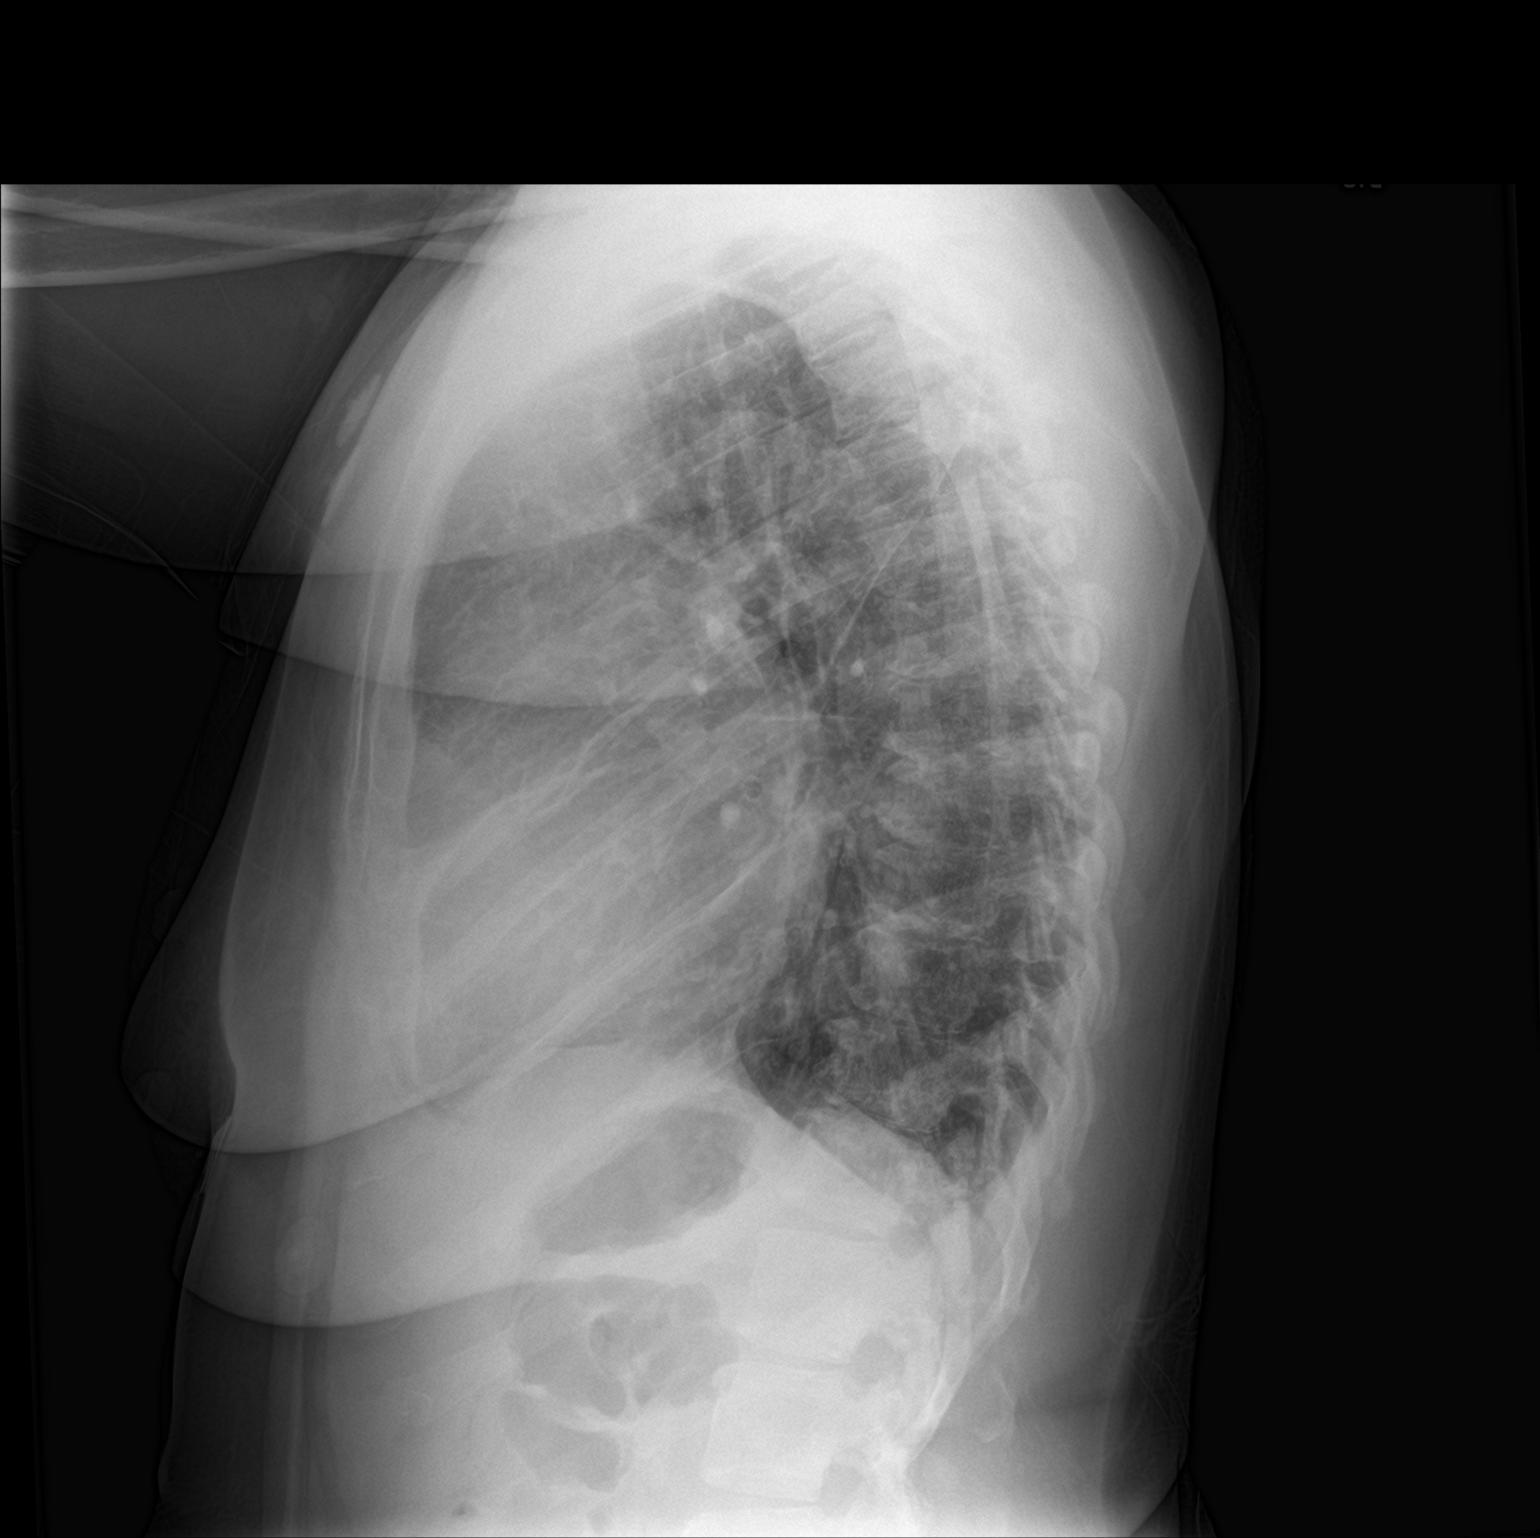

[2 of 2 positions shown; findings below may reference images not displayed]

FINDINGS: Enlargement of cardiac silhouette.

Mediastinal contours and pulmonary vascularity normal.

Retrocardiac LEFT lower lobe opacity with loss of LEFT diaphragmatic
silhouette likely representing pneumonia.

Remaining lungs clear.

No definite pleural effusion or pneumothorax.

Mild broad-based levoconvex thoracic scoliosis.
IMPRESSION: LEFT lower lobe pneumonia.

Enlargement of cardiac silhouette.

## 2020-12-02 MED ORDER — METOPROLOL SUCCINATE ER 50 MG PO TB24: 75.0000 mg | ORAL_TABLET | Freq: Every evening | ORAL | 3 refills | Status: DC

## 2020-12-02 NOTE — Patient Instructions (Addendum)
Nice to see you  today  No lab work needed today  Increase Toprol to 75 mg (1 1/2 tablets) nightly  Your physician recommends that you schedule a follow-up appointment in: 6 months.Call office  in January for appointment   If you have any questions or concerns before your next appointment please send [place] a message through [place] or call our office at [phone].    TO LEAVE A MESSAGE FOR THE NURSE SELECT OPTION 2, PLEASE LEAVE A MESSAGE INCLUDING: YOUR NAME DATE OF BIRTH CALL BACK NUMBER REASON FOR CALL**this is important as we prioritize the call backs  YOU WILL RECEIVE A CALL BACK THE SAME DAY AS LONG AS YOU CALL BEFORE 4:00 PM   milAt the [facility], you and your health needs are our priority. As part of our continuing mission to provide you with exceptional heart care, we have created designated Provider Care Teams. These Care Teams include your primary Cardiologist (physician) and Advanced Practice Providers (APPs- Physician Assistants and Nurse Practitioners) who all work together to provide you with the care you need, when you need it.   You may see any of the following providers on your designated Care Team at your next follow up: Dr Arvilla Meres Dr Marca Ancona Dr Brandon Melnick, NP Robbie Lis, [place] Mikki Santee Karle Plumber, PharmD   Please be sure to bring in all your medications bottles to every appointment.

## 2020-12-02 NOTE — Progress Notes (Signed)
[facility] CL[facility] NOTE  PCP: Ardith Dark, MD Primary Cardiologist: Dr. Flora Lipps  HPI:  Savannah Morales is a 55 y.o. female with a hx of DM, HTN and systolic HF due to NICM.    Says she was doing fine until she got the COVID vaccine [employer]) in 5/21. Admitted 12/24/19 with worsening SOB and cough. Echo showed EF 35%. Diuresed and GDMT started. No coronary calcium and no CP so cath not felt warranted,    cMRI 9/21 - Severely dilated LV EF 21% with normal wall thickness. Diffuse - RVEF 26%. - Severe left atrial enlargement, moderate right atrial enlargement. - Moderate to severe central mitral regurgitation with regurgitant fraction 48%. 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 Lalla Brothers    I saw her on 02/22/20 for the first time and was very tenuous. Multiple meds switched.   R/L cath  02/27/20 EF 20-25%. normal cors with well compensated hemodynamics  Ao = 91/65 (79) LV = 95/5 RA = 2 RV = 35/4 PA = 34/14 (21) PCW = 5 Fick cardiac output/index = 6.2/3.1 Thermo CO/CI =5.2/2.6 PVR =2.6 SVR = 952  FA sat = 97% PA sat = 72%  Zio 11/21  NSR. 8 episodes of NSVT, longest lasting 12.7 seconds at rate of 128bpm. Frequent PVC with burden of 13%.  Zio 12/21  NSR avg 83 bpm. No VT. PVC burden 7.3% with at least 3 morphologies of PVCs.  The most dominant PVC had a 5.8% burden.  Echo 2/22 EF 35-40% RV ok   She is here for f/u. Here with her daughter. Feels good. No SOB, orthopnea or PND. Doing some yoga.   Echo today 12/02/20 EF 40-45% Personally reviewed   ROS: All systems negative except as listed in HPI, PMH and Problem List.  SH:  Social History   Socioeconomic History   Marital status: Divorced    Spouse name: Not on file   Number of children: 3   Years of education: 14   Highest education level: Not on file  Occupational History   Occupation: Actor   Tobacco Use   Smoking status:  Never   Smokeless tobacco: Never  Vaping Use   Vaping Use: Never used  Substance and Sexual Activity   Alcohol use: Not Currently    Comment: Rarely   Drug use: No   Sexual activity: Yes  Other Topics Concern   Not on file  Social History Narrative   Not on file   Social Determinants of Health   Financial Resource Strain: Not on file  Food Insecurity: No Food Insecurity   Worried About Running Out of Food in the Last Year: Never true   Ran Out of Food in the Last Year: Never true  Transportation Needs: No Transportation Needs   Lack of Transportation (Medical): No   Lack of Transportation (Non-Medical): No  Physical Activity: Not on file  Stress: Not on file  Social Connections: Not on file  Intimate Partner Violence: Not on file    FH:  Family History  Problem Relation Age of Onset   Hyperlipidemia Mother    Hypertension Father    Diabetes Father    Cancer Paternal Grandmother     Past Medical History:  Diagnosis Date   CHF (congestive heart failure) (HCC)    Diabetes mellitus without complication (HCC)    Type II   History of chicken  pox    Hypertension    Kidney stones     Current Outpatient Medications  Medication Sig Dispense Refill   benzoyl peroxide-erythromycin (BENZAMYCIN) gel Apply 1 application topically daily.      ELDERBERRY PO Take 1 tablet by mouth daily.     ENTRESTO 49-51 MG TAKE 1 TABLET BY MOUTH TWICE DAILY 60 tablet 3   ferrous sulfate 324 MG TBEC Take 324 mg by mouth.     furosemide (LASIX) 40 MG tablet Take 1 tablet (40 mg total) by mouth every other day. 90 tablet 1   GARLIC PO Take 1 tablet by mouth daily.     metFORMIN (GLUCOPHAGE-XR) 500 MG 24 hr tablet TAKE 1 TABLET BY MOUTH DAILY WITH BREAKFAST 60 tablet 2   metoprolol succinate (TOPROL-XL) 50 MG 24 hr tablet Take 1 tablet (50 mg total) by mouth daily. Take with or immediately following a meal. 90 tablet 3   Multiple Vitamin (MULTIVITAMIN) capsule Take 1 capsule by mouth daily.      Omega-3 Fatty Acids (FISH OIL) 1200 MG CPDR Take 2,400 capsules by mouth daily.      Potassium Chloride ER 20 MEQ TBCR Take 20 mEq by mouth every other day. 90 tablet 1   pregabalin (LYRICA) 75 MG capsule Take 1 capsule (75 mg total) by mouth 2 (two) times daily as needed. 60 capsule 3   No current facility-administered medications for this encounter.    Vitals:   12/02/20 1513  BP: 108/70  Pulse: 72  SpO2: 100%  Weight: 86.1 kg (189 lb 12.8 oz)    Wt Readings from Last 3 Encounters:  12/02/20 86.1 kg (189 lb 12.8 oz)  11/22/20 84.4 kg (186 lb)  09/19/20 84.1 kg (185 lb 6.4 oz)    PHYSICAL EXAM: General:  Well appearing. No resp difficulty HEENT: normal Neck: supple. no JVD. Carotids 2+ bilat; no bruits. No lymphadenopathy or thryomegaly appreciated. Cor: PMI nondisplaced. Regular rate & rhythm. No rubs, gallops or murmurs. Lungs: clear Abdomen: soft, nontender, nondistended. No hepatosplenomegaly. No bruits or masses. Good bowel sounds. Extremities: no cyanosis, clubbing, rash, edema Neuro: alert & orientedx3, cranial nerves grossly intact. moves all 4 extremities w/o difficulty. Affect pleasant   ASSESSMENT & PLAN:  1. Chronic combined systolic and diastolic CHF: - Diagnosed 8/21 EF 30-35% - cMRI 9/21 LVEF 21% RV EF 26% mod-sev MR. No LGE - R/L cath  02/27/20 EF 20-25%. normal cors with well compensated hemodynamics - Felt to have PVC CM - s/p PVC ablation 01/31/20. Zio 11/21 13% PVC. Zio 12/21 7.3% PVCs - Doing very well. NYHA I.  - Volume status improved.  - Echo 2/22 EF 35-40% RV ok - Echo today 12/02/20 EF 40-45% Personally reviewed - Continue Entresto to 49/51 bid. - Continue lasix 40 mg every other day OK to take extra prn. - Off Jardiance due to yeast infections - Increase Toprol  75mg  daily. switched to eplerenone 25 daily  due to gynecomastia. - BP too low to titrate meds further - As above, PVC cardiomyopathy seems to be the smoking gun here but no  improvement since ablation. Repeat Zio patch shows PVC burden 7.3%, with most dominant PVC had 5.8% - EF continues to improve slowly. Previously we discussed adding mexilitene for more complete suppression of PVCs as needed.  - Recent bloodwork reviewed and ok.   2.  Frequent PVCs: -  s/p PVC ablation 01/31/20 with Dr. 04/01/20 - Zio 11/21  NSR. 8 episodes of NSVT, longest  lasting 12.7 seconds at rate of 128bpm. Frequent PVC with burden of 13%. - Zio 12/21  NSR avg 83 bpm. No VT. PVC burden 7.3% with at least 3 morphologies of PVCs.  The most dominant PVC had a 5.8% burden. - has seen Dr. Lalla Brothers and she is hesitant to start AAD or repeat ablation. Can reconsider if EF not improving.  - consider repeat monitor at next visit  Arvilla Meres, MD 12/02/20 3:41 PM

## 2020-12-02 NOTE — Progress Notes (Signed)
  Echocardiogram 2D Echocardiogram has been performed.  Leta Jungling M 12/02/2020, 2:39 PM

## 2020-12-04 ENCOUNTER — Other Ambulatory Visit: Payer: Self-pay | Admitting: Cardiovascular Disease

## 2020-12-04 ENCOUNTER — Other Ambulatory Visit (HOSPITAL_COMMUNITY): Payer: Self-pay | Admitting: Internal Medicine

## 2020-12-09 ENCOUNTER — Other Ambulatory Visit (HOSPITAL_COMMUNITY): Payer: Self-pay

## 2020-12-09 MED ORDER — POTASSIUM CHLORIDE ER 20 MEQ PO TBCR: 20.0000 meq | EXTENDED_RELEASE_TABLET | ORAL | 0 refills | Status: DC

## 2020-12-11 ENCOUNTER — Encounter: Payer: Self-pay | Admitting: Family Medicine

## 2020-12-13 LAB — COLOGUARD: Cologuard: NEGATIVE

## 2020-12-16 NOTE — Progress Notes (Signed)
Please inform patient of the following:  Cologuard is negative.  We can recheck in 3 years.

## 2021-02-05 ENCOUNTER — Other Ambulatory Visit (HOSPITAL_COMMUNITY): Payer: Self-pay

## 2021-02-05 MED ORDER — POTASSIUM CHLORIDE ER 20 MEQ PO TBCR: 20.0000 meq | EXTENDED_RELEASE_TABLET | ORAL | 0 refills | Status: DC

## 2021-02-06 ENCOUNTER — Telehealth (HOSPITAL_COMMUNITY): Payer: Self-pay | Admitting: Internal Medicine

## 2021-02-06 NOTE — Telephone Encounter (Signed)
Pt called to f/u w/FMLA forms, please advise

## 2021-02-11 ENCOUNTER — Telehealth (HOSPITAL_COMMUNITY): Payer: Self-pay | Admitting: Internal Medicine

## 2021-02-11 NOTE — Telephone Encounter (Signed)
Pt called to f/u w/FMLA forms, deadline is tomorrow 10/19, forms were emailed to you on 10/05, please advise

## 2021-02-11 NOTE — Telephone Encounter (Signed)
Forms for intermittent FMLA completed, signed by Dr Gala Romney, and emailed to pt.

## 2021-02-18 ENCOUNTER — Encounter: Payer: Self-pay | Admitting: Family Medicine

## 2021-02-19 ENCOUNTER — Other Ambulatory Visit: Payer: Self-pay | Admitting: Family Medicine

## 2021-02-19 DIAGNOSIS — E119 Type 2 diabetes mellitus without complications: Secondary | ICD-10-CM

## 2021-03-12 ENCOUNTER — Telehealth (HOSPITAL_COMMUNITY): Payer: Self-pay | Admitting: *Deleted

## 2021-03-12 NOTE — Telephone Encounter (Signed)
Pt left vm requesting a call back from Dinah Beers about FMLA paperwork.   Routed to [employer]

## 2021-03-13 NOTE — Telephone Encounter (Signed)
FMLA forms updated, signed by Dr Gala Romney, and faxed to [phone], pt aware

## 2021-03-15 ENCOUNTER — Other Ambulatory Visit (HOSPITAL_COMMUNITY): Payer: Self-pay | Admitting: Internal Medicine

## 2021-05-16 ENCOUNTER — Encounter: Payer: Self-pay | Admitting: Family Medicine

## 2021-05-16 ENCOUNTER — Other Ambulatory Visit: Payer: Self-pay

## 2021-05-16 ENCOUNTER — Ambulatory Visit (INDEPENDENT_AMBULATORY_CARE_PROVIDER_SITE_OTHER): Payer: 59 | Admitting: Family Medicine

## 2021-05-16 VITALS — BP 133/76 | HR 78 | Temp 99.0°F | Wt 183.6 lb

## 2021-05-16 DIAGNOSIS — E119 Type 2 diabetes mellitus without complications: Secondary | ICD-10-CM

## 2021-05-16 DIAGNOSIS — E1159 Type 2 diabetes mellitus with other circulatory complications: Secondary | ICD-10-CM | POA: Diagnosis not present

## 2021-05-16 DIAGNOSIS — Z23 Encounter for immunization: Secondary | ICD-10-CM

## 2021-05-16 DIAGNOSIS — I152 Hypertension secondary to endocrine disorders: Secondary | ICD-10-CM | POA: Diagnosis not present

## 2021-05-16 LAB — POCT GLYCOSYLATED HEMOGLOBIN (HGB A1C): Hemoglobin A1C: 5.8 % — AB (ref 4.0–5.6)

## 2021-05-16 MED ORDER — BENZONATATE 200 MG PO CAPS: 200.0000 mg | ORAL_CAPSULE | Freq: Two times a day (BID) | ORAL | 0 refills | Status: DC | PRN

## 2021-05-16 MED ORDER — AZITHROMYCIN 250 MG PO TABS: ORAL_TABLET | ORAL | 0 refills | Status: DC

## 2021-05-16 NOTE — Assessment & Plan Note (Signed)
A1c 5.8.  Congratulated patient on lifestyle modifications.  She has been trying to cut out sugar.  We will continue metformin 500 mg daily.  Recheck in 6 months for CPE.

## 2021-05-16 NOTE — Assessment & Plan Note (Signed)
Blood pressure is at goal today.  She is on metoprolol succinate 75 mg daily, eplerenone 25 mg daily, and Entresto 49-51 twice daily.

## 2021-05-16 NOTE — Patient Instructions (Addendum)
It was very nice to see you today!  Your COVID test is negative.  You likely have a viral infection.  Please try the cough medication.  Start the antibiotic if your symptoms are not improving or if your symptoms worsen.  Your A1c is 5.8.  Please keep up the good work.  We will get your flu shot today.  We will see back in 6 months for your annual checkup with labs.  Please come back to see me sooner if needed.  Take care, Dr Jerline Pain  PLEASE NOTE:  If you had any lab tests please let us know if you have not heard back within a few days. You may see your results on mychart before we have a chance to review them but we will give you a call once they are reviewed by [place]. If we ordered any referrals today, please let us know if you have not heard from their office within the next week.   Please try these tips to maintain a healthy lifestyle:  Eat at least 3 REAL meals and 1-2 snacks per day.  Aim for no more than 5 hours between eating.  If you eat breakfast, please do so within one hour of getting up.   Each meal should contain half fruits/vegetables, one quarter protein, and one quarter carbs (no bigger than a computer mouse)  Cut down on sweet beverages. This includes juice, soda, and sweet tea.   Drink at least 1 glass of water with each meal and aim for at least 8 glasses per day  Exercise at least 150 minutes every week.

## 2021-05-16 NOTE — Progress Notes (Signed)
° °  Savannah Morales is a 56 y.o. female who presents today for an office visit.  Assessment/Plan:  New/Acute Problems: Cough No red flags.  COVID test negative.  Likely viral URI.  Start Tessalon.  We will send pocket prescription for azithromycin with instruction to not start unless symptoms worsen or not improve in the next several days.  Continue good hydration.  Continue over-the-counter meds.  Discussed reasons to return to care.  Follow-up as needed.  Chronic Problems Addressed Today: Type 2 diabetes mellitus (HCC) A1c 5.8.  Congratulated patient on lifestyle modifications.  She has been trying to cut out sugar.  We will continue metformin 500 mg daily.  Recheck in 6 months for CPE.  Hypertension associated with diabetes ([place]) Blood pressure is at goal today.  She is on metoprolol succinate 75 mg daily, eplerenone 25 mg daily, and Entresto 49-51 twice daily.  Flu shot was given today.     Subjective:  HPI:  Patient here with cough. Started a couple of days. Non productive. No rhinohrrea. No fevers or chills. No chest pain or shortness of breath. Tried OTC meds with modest improvement. No known sick contacts.   See A/P for status of chronic conditions.       Objective:  Physical Exam: BP 133/76 (BP Location: Right Arm, Patient Position: Sitting, Cuff Size: Normal)    Pulse 78    Temp 99 F (37.2 C)    Wt 183 lb 9.6 oz (83.3 kg)    SpO2 100%    BMI 27.92 kg/m   Gen: No acute distress, resting comfortably CV: Regular rate and rhythm with no murmurs appreciated Pulm: Normal work of breathing, clear to auscultation bilaterally with no crackles, wheezes, or rhonchi Neuro: Grossly normal, moves all extremities Psych: Normal affect and thought content      Trystin Hargrove M. Jerline Pain, MD 05/16/2021 12:02 PM

## 2021-05-29 ENCOUNTER — Ambulatory Visit: Payer: 59 | Admitting: Family Medicine

## 2021-06-15 ENCOUNTER — Other Ambulatory Visit (HOSPITAL_COMMUNITY): Payer: Self-pay | Admitting: Internal Medicine

## 2021-07-21 ENCOUNTER — Encounter (HOSPITAL_COMMUNITY): Payer: Self-pay | Admitting: Internal Medicine

## 2021-08-03 ENCOUNTER — Other Ambulatory Visit (HOSPITAL_COMMUNITY): Payer: Self-pay | Admitting: Internal Medicine

## 2021-08-03 IMAGING — DX DG CERVICAL SPINE 2 OR 3 VIEWS
3 series · 3 of 3 positions shown · non-contrast
Comparison: August 20, 2020

CLINICAL DATA: Reassess facets.

EXAM:
CERVICAL SPINE - 2-3 VIEW

[c-spine lat]
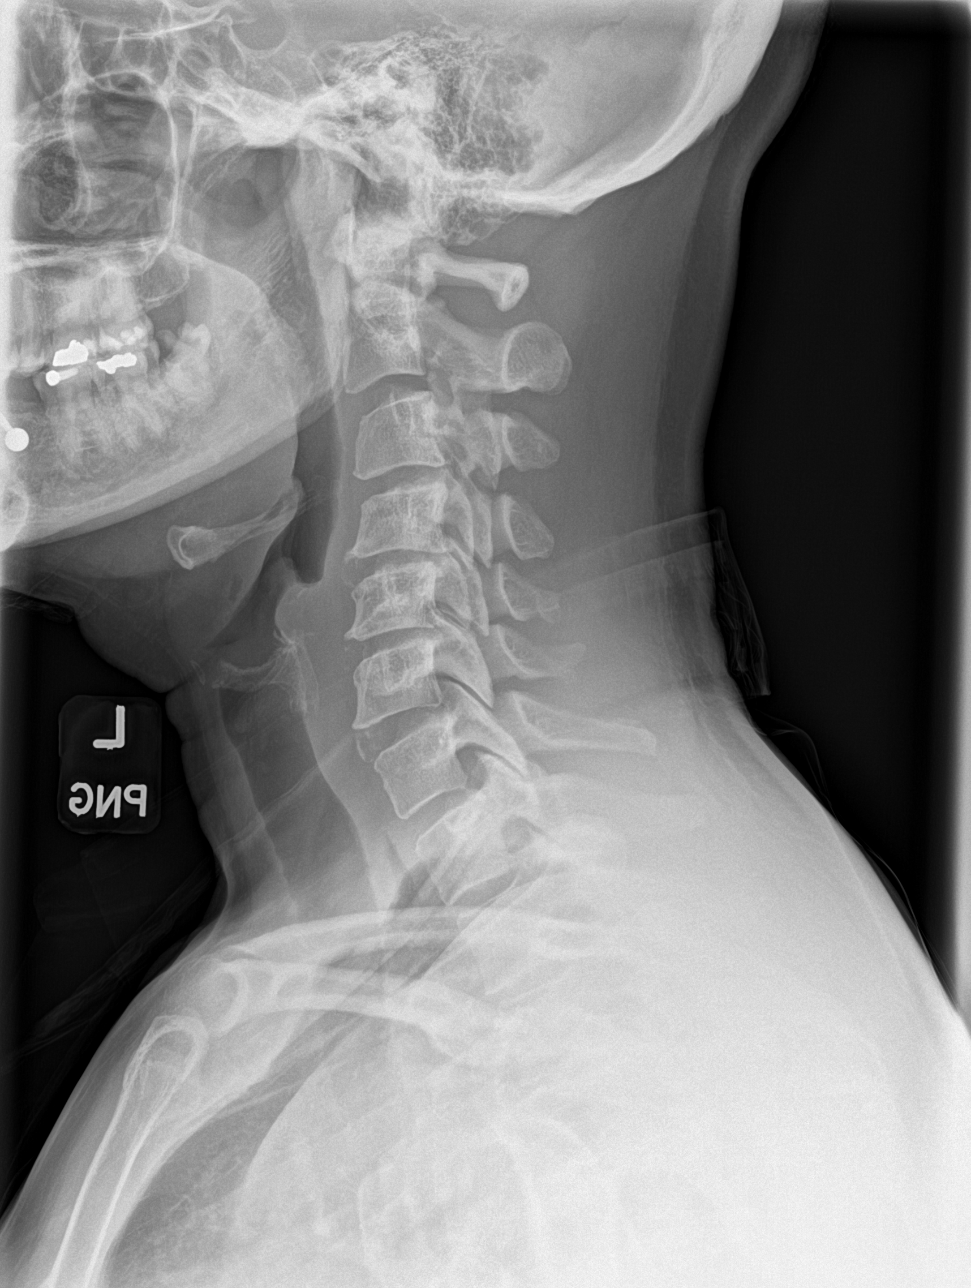

[c-spine ap]
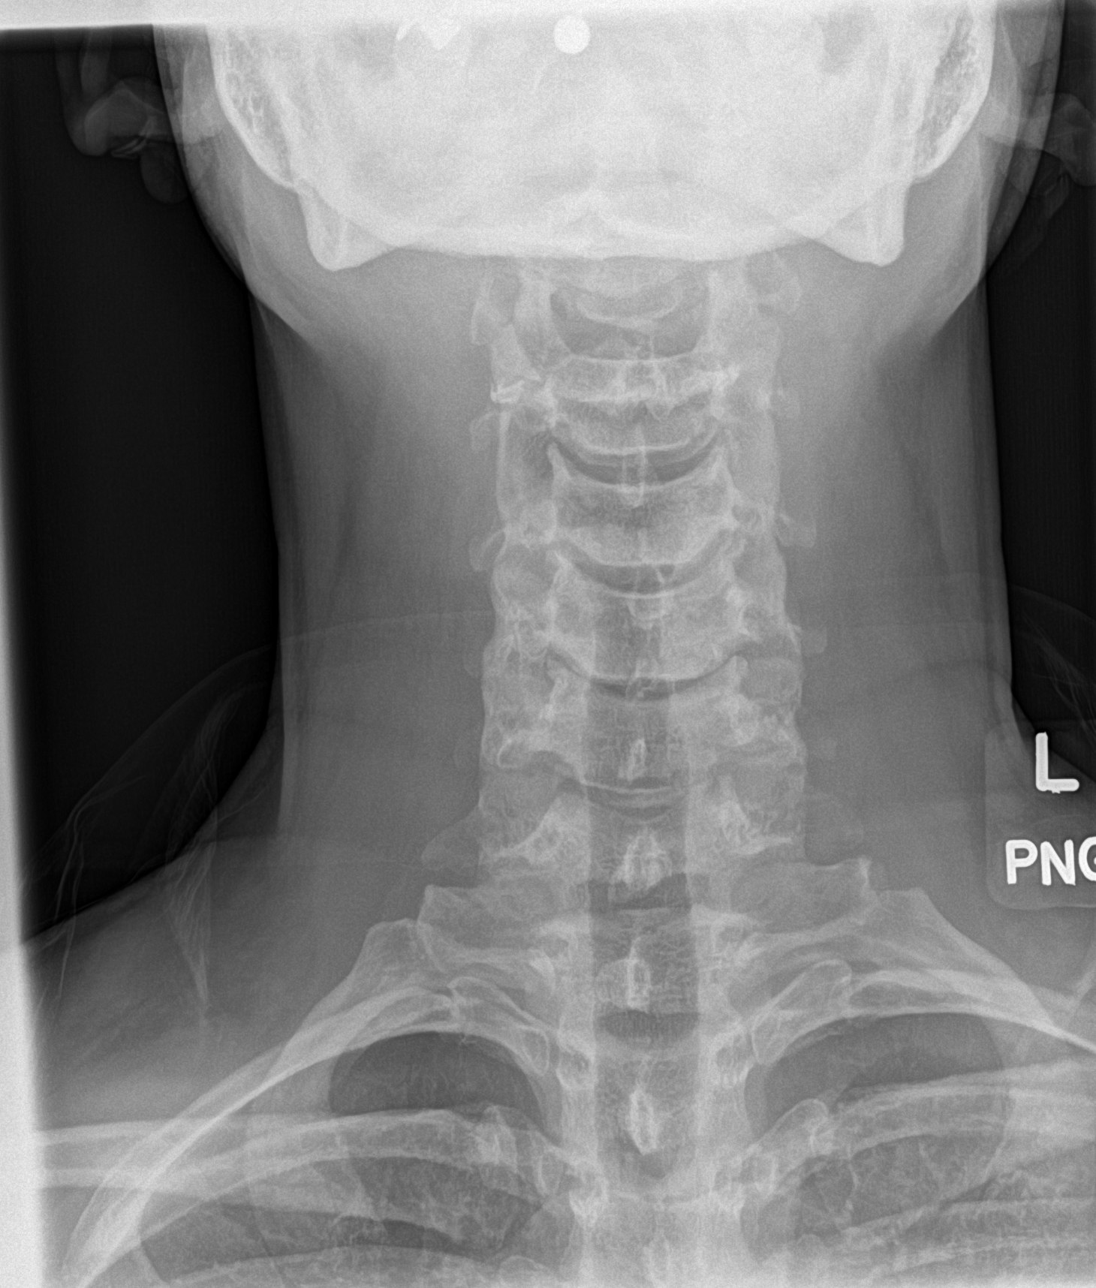

[c-spine open mouth]
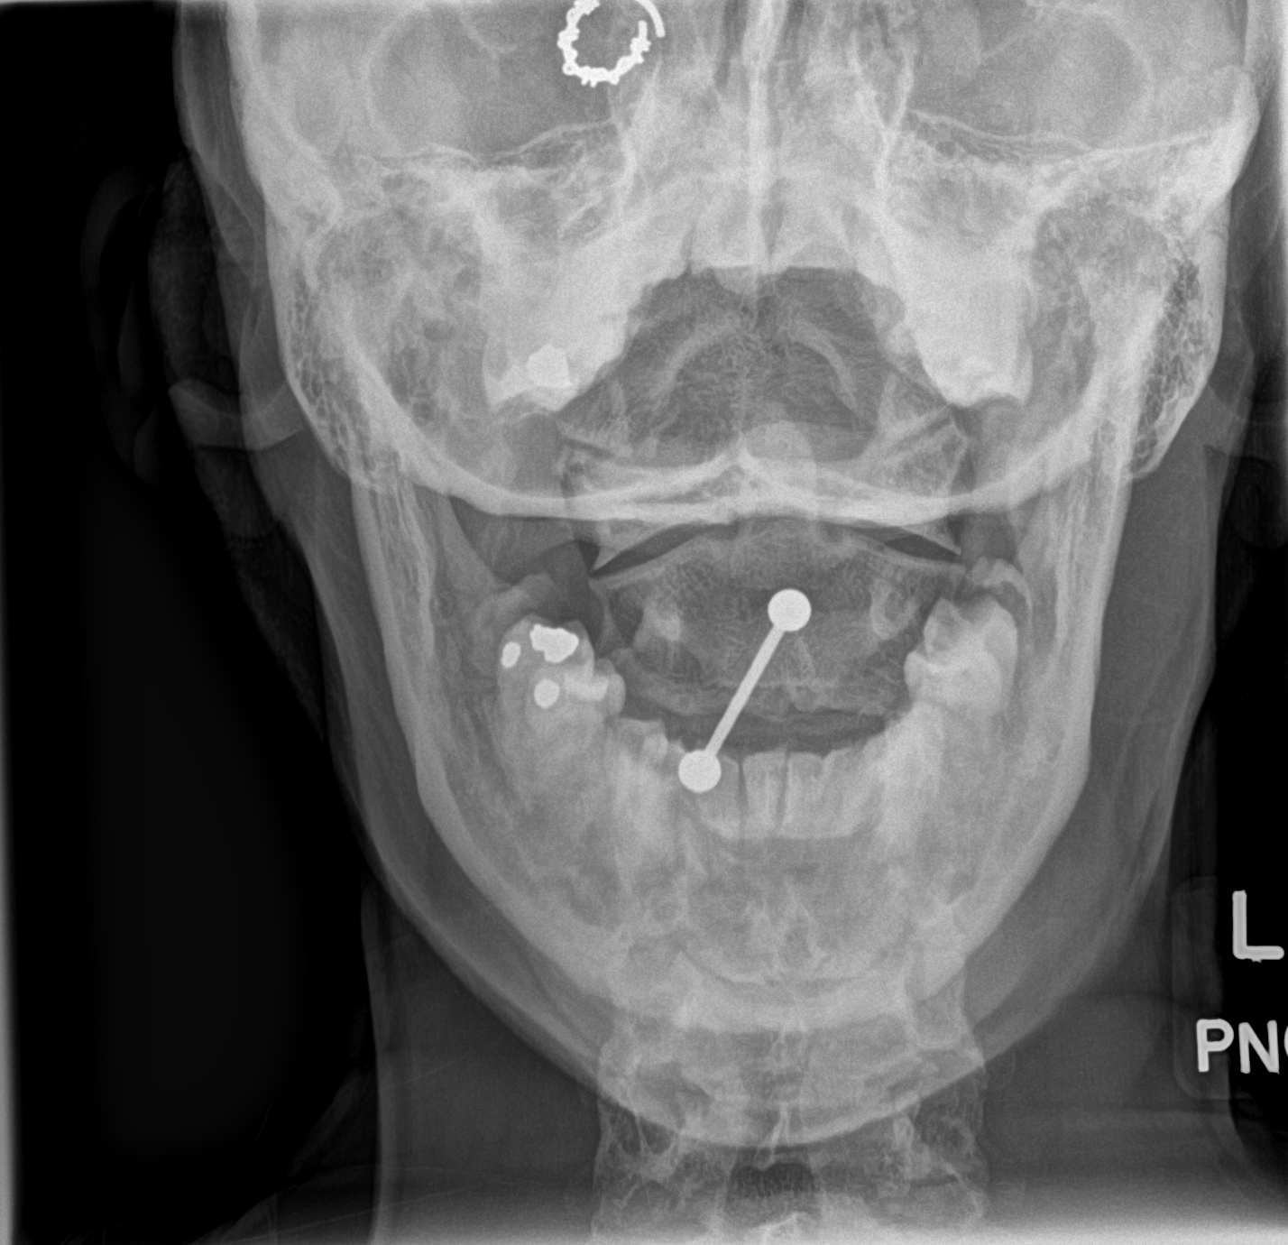

[3 of 3 positions shown; findings below may reference images not displayed]

FINDINGS: The facets are thought to align appropriately in the appearance is
thought to be due to rotation. No traumatic malalignment. Reversal
of normal lordosis centered at C4. Minimal retrolisthesis of C4
versus C5, unchanged. No fractures identified. Degenerative disc
disease seen at C4-5 and C5-6 with small anterior osteophytes. The
pre odontoid space and prevertebral soft tissues are normal. Lateral
masses of C1 align with C2. The odontoid process is normal. The lung
apices are normal.
IMPRESSION: 1. No jumped or perched facets identified. The appearance of the
facets is likely due to rotation and the facets appear to articulate
normally. Minimal retrolisthesis of C4 versus C5, stable.
Degenerative disc disease, stable. Oblique views were not obtained
to assess the neural foramina. No other abnormalities. If concern
persists, CT or MRI could better evaluate.

## 2021-08-04 ENCOUNTER — Encounter (HOSPITAL_COMMUNITY): Payer: Self-pay | Admitting: Internal Medicine

## 2021-08-04 ENCOUNTER — Other Ambulatory Visit (HOSPITAL_COMMUNITY): Payer: Self-pay | Admitting: *Deleted

## 2021-08-22 ENCOUNTER — Encounter (HOSPITAL_COMMUNITY): Payer: Self-pay | Admitting: Internal Medicine

## 2021-08-22 ENCOUNTER — Ambulatory Visit (HOSPITAL_COMMUNITY)
Admission: RE | Admit: 2021-08-22 | Discharge: 2021-08-22 | Disposition: A | Discharge status: Home / Self Care | Payer: 59 | Source: Ambulatory Visit | Attending: Internal Medicine | Admitting: Internal Medicine

## 2021-08-22 VITALS — BP 98/60 | HR 67 | Wt 173.6 lb

## 2021-08-22 DIAGNOSIS — I110 Hypertensive heart disease with heart failure: Secondary | ICD-10-CM | POA: Diagnosis present

## 2021-08-22 DIAGNOSIS — Z7984 Long term (current) use of oral hypoglycemic drugs: Secondary | ICD-10-CM | POA: Diagnosis not present

## 2021-08-22 DIAGNOSIS — I428 Other cardiomyopathies: Secondary | ICD-10-CM | POA: Insufficient documentation

## 2021-08-22 DIAGNOSIS — I5042 Chronic combined systolic (congestive) and diastolic (congestive) heart failure: Secondary | ICD-10-CM | POA: Insufficient documentation

## 2021-08-22 DIAGNOSIS — Z79899 Other long term (current) drug therapy: Secondary | ICD-10-CM | POA: Insufficient documentation

## 2021-08-22 DIAGNOSIS — N62 Hypertrophy of breast: Secondary | ICD-10-CM | POA: Diagnosis not present

## 2021-08-22 DIAGNOSIS — Z833 Family history of diabetes mellitus: Secondary | ICD-10-CM | POA: Insufficient documentation

## 2021-08-22 DIAGNOSIS — E119 Type 2 diabetes mellitus without complications: Secondary | ICD-10-CM | POA: Diagnosis not present

## 2021-08-22 DIAGNOSIS — I472 Ventricular tachycardia, unspecified: Secondary | ICD-10-CM | POA: Insufficient documentation

## 2021-08-22 DIAGNOSIS — Z8249 Family history of ischemic heart disease and other diseases of the circulatory system: Secondary | ICD-10-CM | POA: Insufficient documentation

## 2021-08-22 DIAGNOSIS — I5022 Chronic systolic (congestive) heart failure: Secondary | ICD-10-CM

## 2021-08-22 DIAGNOSIS — R9431 Abnormal electrocardiogram [ECG] [EKG]: Secondary | ICD-10-CM | POA: Diagnosis not present

## 2021-08-22 DIAGNOSIS — I493 Ventricular premature depolarization: Secondary | ICD-10-CM

## 2021-08-22 NOTE — Progress Notes (Signed)
Pt's daughters FMLA signed by Dr Gala Romney and given to pt at appt ?

## 2021-08-22 NOTE — Patient Instructions (Signed)
Your physician has requested that you have an echocardiogram. Echocardiography is a painless test that uses sound waves to create images of your heart. It provides your doctor with information about the size and shape of your heart and how well your heart?s chambers and valves are working. This procedure takes approximately one hour. There are no restrictions for this procedure. ? ?Your physician recommends that you schedule a follow-up appointment in: 6 months, **PLEASE CALL OUR OFFICE IN AUGUST TO SCHEDULE THIS APPOINTMENT ? ?If you have any questions or concerns before your next appointment please send [place] a message through [place] or call our office at [phone].   ? ?TO LEAVE A MESSAGE FOR THE NURSE SELECT OPTION 2, PLEASE LEAVE A MESSAGE INCLUDING: ?YOUR NAME ?DATE OF BIRTH ?CALL BACK NUMBER ?REASON FOR CALL**this is important as we prioritize the call backs ? ?YOU WILL RECEIVE A CALL BACK THE SAME DAY AS LONG AS YOU CALL BEFORE 4:00 PM ? ? ?

## 2021-08-22 NOTE — Progress Notes (Addendum)
? ?    ADVANCED HF CLINIC NOTE ? ?PCP: Ardith Dark, MD ?Primary Cardiologist: Dr. Flora Lipps ? ?HPI: ? ?CARLISLE TORGESON is a 56 y.o. female with a hx of DM, HTN and systolic HF due to NICM.  ?  ?Says she was doing fine until she got the COVID vaccine [employer]) in 5/21. Admitted 12/24/19 with worsening SOB and cough. Echo showed EF 35%. Diuresed and GDMT started. No coronary calcium and no CP so cath not felt warranted,  ?  ?cMRI 9/21 ?- Severely dilated LV EF 21% with normal wall thickness. Diffuse ?- RVEF 26%. ?- Severe left atrial enlargement, moderate right atrial enlargement. ?- Moderate to severe central mitral regurgitation with regurgitant fraction 48%. ?- No LGE ?  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 Lalla Brothers  ?  ?I saw her on 02/22/20 for the first time and was very tenuous. Multiple meds switched.  ? ?R/L cath  02/27/20 EF 20-25%. normal cors with well compensated hemodynamics ? ?Ao = 91/65 (79) ?LV = 95/5 ?RA = 2 ?RV = 35/4 ?PA = 34/14 (21) ?PCW = 5 ?Fick cardiac output/index = 6.2/3.1 ?Thermo CO/CI =5.2/2.6 ?PVR =2.6 ?SVR = 952  ?FA sat = 97% ?PA sat = 72% ? ?Zio 11/21  ?NSR. 8 episodes of NSVT, longest lasting 12.7 seconds at rate of 128bpm. Frequent PVC with burden of 13%. ? ?Zio 12/21  ?NSR avg 83 bpm. No VT. PVC burden 7.3% with at least 3 morphologies of PVCs.  The most dominant PVC had a 5.8% burden. ? ?Echo 2/22 EF 35-40% RV ok  ?Echo today 12/02/20 EF 40-45%  ? ?She is here for f/u. She feels good. Active with walking and home workouts. Denies SOB, orthopnea, edema, dizziness.  ? ? ? ?ROS: All systems negative except as listed in HPI, PMH and Problem List. ? ?SH:  ?Social History  ? ?Socioeconomic History  ? Marital status: Divorced  ?  Spouse name: Not on file  ? Number of children: 3  ? Years of education: 51  ? Highest education level: Not on file  ?Occupational History  ? Occupation: Actor   ?Tobacco Use  ? Smoking status:  Never  ? Smokeless tobacco: Never  ?Vaping Use  ? Vaping Use: Never used  ?Substance and Sexual Activity  ? Alcohol use: Not Currently  ?  Comment: Rarely  ? Drug use: No  ? Sexual activity: Yes  ?Other Topics Concern  ? Not on file  ?Social History Narrative  ? Not on file  ? ?Social Determinants of Health  ? ?Financial Resource Strain: Not on file  ?Food Insecurity: Not on file  ?Transportation Needs: Not on file  ?Physical Activity: Not on file  ?Stress: Not on file  ?Social Connections: Not on file  ?Intimate Partner Violence: Not on file  ? ? ?FH:  ?Family History  ?Problem Relation Age of Onset  ? Hyperlipidemia Mother   ? Hypertension Father   ? Diabetes Father   ? Cancer Paternal Grandmother   ? ? ?Past Medical History:  ?Diagnosis Date  ? CHF (congestive heart failure) (HCC)   ? Diabetes mellitus without complication (HCC)   ? Type II  ? History of chicken pox   ? Hypertension   ? Kidney stones   ? ? ?Current Outpatient Medications  ?Medication Sig Dispense Refill  ? benzoyl peroxide-erythromycin (BENZAMYCIN) gel Apply 1 application topically daily.     ? ELDERBERRY PO Take  1 tablet by mouth daily.    ? eplerenone (INSPRA) 25 MG tablet TAKE 1 TABLET(25 MG) BY MOUTH DAILY 90 tablet 3  ? ferrous sulfate 324 MG TBEC Take 324 mg by mouth.    ? furosemide (LASIX) 40 MG tablet TAKE 1 TABLET BY MOUTH EVERY OTHER DAY 90 tablet 1  ? GARLIC PO Take 1 tablet by mouth daily.    ? metFORMIN (GLUCOPHAGE-XR) 500 MG 24 hr tablet TAKE 1 TABLET BY MOUTH DAILY WITH BREAKFAST 60 tablet 2  ? metoprolol succinate (TOPROL-XL) 50 MG 24 hr tablet Take 1.5 tablets (75 mg total) by mouth at bedtime. Take with or immediately following a meal. 90 tablet 3  ? Multiple Vitamin (MULTIVITAMIN) capsule Take 1 capsule by mouth daily.    ? Omega-3 Fatty Acids (FISH OIL) 1200 MG CPDR Take 2,400 capsules by mouth daily.     ? Potassium Chloride ER 20 MEQ TBCR TAKE 1 TABLET BY MOUTH EVERY OTHER DAY 90 tablet 0  ? sacubitril-valsartan (ENTRESTO)  49-51 MG Take 1 tablet by mouth 2 (two) times daily. CALL OFFICE TO SCHEDULE FOLLOW UP APPOINTMENT 60 tablet 3  ? ?No current facility-administered medications for this encounter.  ? ? ?Vitals:  ? 08/22/21 1040  ?BP: 98/60  ?Pulse: 67  ?SpO2: 100%  ?Weight: 78.7 kg (173 lb 9.6 oz)  ? ? ?Wt Readings from Last 3 Encounters:  ?08/22/21 78.7 kg (173 lb 9.6 oz)  ?05/16/21 83.3 kg (183 lb 9.6 oz)  ?12/02/20 86.1 kg (189 lb 12.8 oz)  ? ? ?PHYSICAL EXAM: ?General:  Well appearing. No resp difficulty ?HEENT: normal ?Neck: supple. no JVD. Carotids 2+ bilat; no bruits. No lymphadenopathy or thryomegaly appreciated. ?Cor: PMI nondisplaced. Regular rate & rhythm. No rubs, gallops or murmurs. ?Lungs: clear ?Abdomen: soft, nontender, nondistended. No hepatosplenomegaly. No bruits or masses. Good bowel sounds. ?Extremities: no cyanosis, clubbing, rash, edema ?Neuro: alert & orientedx3, cranial nerves grossly intact. moves all 4 extremities w/o difficulty. Affect pleasant ? ?ECG NSR 83 no PVCs! Personally reviewed ? ? ? ?ASSESSMENT & PLAN: ? ?1. Chronic combined systolic and diastolic CHF: ?- Diagnosed 8/21 EF 30-35% ?- cMRI 9/21 LVEF 21% RV EF 26% mod-sev MR. No LGE ?- R/L cath  02/27/20 EF 20-25%. normal cors with well compensated hemodynamics ?- Felt to have PVC CM - s/p PVC ablation 01/31/20. Zio 11/21 13% PVC. Zio 12/21 7.3% PVCs ?- Doing very well. NYHA I  ?- Volume status looks good.  ?- Echo 2/22 EF 35-40% RV ok ?- Echo 8/22 EF 40-45% ?- Continue Entresto to 49/51 bid. ?- Continue lasix 40 mg every other day OK to take extra prn. ?- Off Jardiance due to yeast infections ?- Continue Toprol  75mg  daily. ?- switched to eplerenone 25 daily  due to gynecomastia. ?- BP too low to titrate meds further ?- As above, PVC cardiomyopathy seems to be the smoking gun here but no improvement since ablation. Repeat Zio patch shows PVC burden 7.3%, with most dominant PVC had 5.8% ?- EF continues to improve slowly. Previously we discussed  adding mexilitene for more complete suppression of PVCs as needed but may not be necessary ?- Will repeat echo to reassess EF. IF EF still down will place ZIo to reassess PVC burden ? ?2.  Frequent PVCs: ?-  s/p PVC ablation 01/31/20 with Dr. 04/01/20 ?- Zio 11/21  ?NSR. 8 episodes of NSVT, longest lasting 12.7 seconds at rate of 128bpm. Frequent PVC with burden of 13%. ?- Zio 12/21  ?  NSR avg 83 bpm. No VT. PVC burden 7.3% with at least 3 morphologies of PVCs.  The most dominant PVC had a 5.8% burden. ?- has seen Dr. Lalla Brothers and she is hesitant to start AAD or repeat ablation. Can reconsider if EF not improving.  ?- No PVCs on ECG today ?- Will repeat echo  ? ?Arvilla Meres, MD ?08/22/21 ?10:55 AM ?   ?  ?

## 2021-09-15 ENCOUNTER — Ambulatory Visit (HOSPITAL_COMMUNITY)
Admission: RE | Admit: 2021-09-15 | Discharge: 2021-09-15 | Disposition: A | Discharge status: Home / Self Care | Payer: 59 | Source: Ambulatory Visit | Attending: Internal Medicine | Admitting: Internal Medicine

## 2021-09-15 DIAGNOSIS — I340 Nonrheumatic mitral (valve) insufficiency: Secondary | ICD-10-CM | POA: Diagnosis not present

## 2021-09-15 DIAGNOSIS — E119 Type 2 diabetes mellitus without complications: Secondary | ICD-10-CM | POA: Insufficient documentation

## 2021-09-15 DIAGNOSIS — I110 Hypertensive heart disease with heart failure: Secondary | ICD-10-CM | POA: Diagnosis not present

## 2021-09-15 DIAGNOSIS — I5022 Chronic systolic (congestive) heart failure: Secondary | ICD-10-CM | POA: Diagnosis not present

## 2021-09-15 DIAGNOSIS — I428 Other cardiomyopathies: Secondary | ICD-10-CM | POA: Diagnosis not present

## 2021-09-15 DIAGNOSIS — I429 Cardiomyopathy, unspecified: Secondary | ICD-10-CM | POA: Insufficient documentation

## 2021-09-15 LAB — ECHOCARDIOGRAM COMPLETE
AR max vel: 2.15 cm2
AV Peak grad: 10.8 mmHg
Ao pk vel: 1.64 m/s
Area-P 1/2: 3.72 cm2
S' Lateral: 4.3 cm
Single Plane A4C EF: 39.8 %

## 2021-09-30 ENCOUNTER — Other Ambulatory Visit: Payer: Self-pay | Admitting: *Deleted

## 2021-09-30 DIAGNOSIS — E119 Type 2 diabetes mellitus without complications: Secondary | ICD-10-CM

## 2021-09-30 MED ORDER — METFORMIN HCL ER 500 MG PO TB24: 500.0000 mg | ORAL_TABLET | Freq: Every day | ORAL | 2 refills | Status: DC

## 2021-10-09 LAB — HM DIABETES EYE EXAM

## 2021-10-27 ENCOUNTER — Other Ambulatory Visit (HOSPITAL_COMMUNITY): Payer: Self-pay

## 2021-10-27 MED ORDER — FUROSEMIDE 40 MG PO TABS: 40.0000 mg | ORAL_TABLET | ORAL | 1 refills | Status: DC

## 2021-11-12 ENCOUNTER — Other Ambulatory Visit (HOSPITAL_COMMUNITY): Payer: Self-pay | Admitting: Internal Medicine

## 2021-11-12 ENCOUNTER — Encounter: Payer: Self-pay | Admitting: Family Medicine

## 2021-11-13 ENCOUNTER — Ambulatory Visit: Payer: 59 | Admitting: Family Medicine

## 2021-11-14 ENCOUNTER — Other Ambulatory Visit (HOSPITAL_COMMUNITY): Payer: Self-pay

## 2021-11-14 ENCOUNTER — Encounter: Payer: Self-pay | Admitting: Family Medicine

## 2021-11-14 ENCOUNTER — Ambulatory Visit: Payer: 59 | Admitting: Family Medicine

## 2021-11-14 VITALS — BP 100/64 | HR 51 | Temp 97.6°F | Ht 68.0 in | Wt 164.6 lb

## 2021-11-14 DIAGNOSIS — R7989 Other specified abnormal findings of blood chemistry: Secondary | ICD-10-CM

## 2021-11-14 DIAGNOSIS — E1159 Type 2 diabetes mellitus with other circulatory complications: Secondary | ICD-10-CM | POA: Diagnosis not present

## 2021-11-14 DIAGNOSIS — E119 Type 2 diabetes mellitus without complications: Secondary | ICD-10-CM

## 2021-11-14 DIAGNOSIS — I152 Hypertension secondary to endocrine disorders: Secondary | ICD-10-CM

## 2021-11-14 DIAGNOSIS — I5022 Chronic systolic (congestive) heart failure: Secondary | ICD-10-CM

## 2021-11-14 DIAGNOSIS — Z1159 Encounter for screening for other viral diseases: Secondary | ICD-10-CM

## 2021-11-14 DIAGNOSIS — Z0001 Encounter for general adult medical examination with abnormal findings: Secondary | ICD-10-CM

## 2021-11-14 DIAGNOSIS — I493 Ventricular premature depolarization: Secondary | ICD-10-CM

## 2021-11-14 LAB — CBC
HCT: 38.1 % (ref 36.0–46.0)
Hemoglobin: 12.3 g/dL (ref 12.0–15.0)
MCHC: 32.4 g/dL (ref 30.0–36.0)
MCV: 85 fl (ref 78.0–100.0)
Platelets: 167 10*3/uL (ref 150.0–400.0)
RBC: 4.48 Mil/uL (ref 3.87–5.11)
RDW: 14.8 % (ref 11.5–15.5)
WBC: 3.4 10*3/uL — ABNORMAL LOW (ref 4.0–10.5)

## 2021-11-14 LAB — POCT GLYCOSYLATED HEMOGLOBIN (HGB A1C): Hemoglobin A1C: 5.6 % (ref 4.0–5.6)

## 2021-11-14 LAB — T4, FREE: Free T4: 0.76 ng/dL (ref 0.60–1.60)

## 2021-11-14 LAB — COMPREHENSIVE METABOLIC PANEL
ALT: 13 U/L (ref 0–35)
AST: 17 U/L (ref 0–37)
Albumin: 4.4 g/dL (ref 3.5–5.2)
Alkaline Phosphatase: 49 U/L (ref 39–117)
BUN: 13 mg/dL (ref 6–23)
CO2: 27 mEq/L (ref 19–32)
Calcium: 9.5 mg/dL (ref 8.4–10.5)
Chloride: 106 mEq/L (ref 96–112)
Creatinine, Ser: 0.72 mg/dL (ref 0.40–1.20)
GFR: 95.03 mL/min (ref >60.00)
Glucose, Bld: 85 mg/dL (ref 70–99)
Potassium: 3.8 mEq/L (ref 3.5–5.1)
Sodium: 141 mEq/L (ref 135–145)
Total Bilirubin: 0.4 mg/dL (ref 0.2–1.2)
Total Protein: 7.2 g/dL (ref 6.0–8.3)

## 2021-11-14 LAB — LIPID PANEL
Cholesterol: 154 mg/dL (ref 0–200)
HDL: 59.5 mg/dL (ref >39.00)
LDL Cholesterol: 82 mg/dL (ref 0–99)
NonHDL: 94.5
Total CHOL/HDL Ratio: 3
Triglycerides: 61 mg/dL (ref 0.0–149.0)
VLDL: 12.2 mg/dL (ref 0.0–40.0)

## 2021-11-14 LAB — TSH: TSH: 0.57 u[IU]/mL (ref 0.35–5.50)

## 2021-11-14 MED ORDER — METOPROLOL SUCCINATE ER 50 MG PO TB24: 75.0000 mg | ORAL_TABLET | Freq: Every evening | ORAL | 2 refills | Status: DC

## 2021-11-14 NOTE — Assessment & Plan Note (Signed)
Doing well.  Not having any significant issues.  Follows with cardiology.

## 2021-11-14 NOTE — Progress Notes (Signed)
Chief Complaint:  Savannah Morales is a 56 y.o. female who presents today for her annual comprehensive physical exam.    Assessment/Plan:  Chronic Problems Addressed Today: Type 2 diabetes mellitus (HCC) A1c improved 5.6.  She is doing a great job with lifestyle modifications.  We will stop her metformin.  We can recheck her A1c in 6 months.  PVC's (premature ventricular contractions) Doing well.  Not having any significant issues.  Follows with cardiology.  Low TSH level Check TSH and T4.  CHF (congestive heart failure) (HCC) Doing well.  Follows with cardiology.  Recent echo showed EF of 40 to 45%.  She is euvolemic today.  We will continue current medications per cardiology.  Hypertension associated with diabetes (HCC) Blood pressure at goal today on Entresto 49-51 twice daily, eplerenone 25 mg daily, and metoprolol succinate 75 mg nightly.  Preventative Healthcare: We will check labs today.  She is up-to-date on colon cancer screening.  She gets her mammogram and Pap smear done through gynecology.  Up-to-date on shingles vaccine.  Patient Counseling(The following topics were reviewed and/or handout was given):  -Nutrition: Stressed importance of moderation in sodium/caffeine intake, saturated fat and cholesterol, caloric balance, sufficient intake of fresh fruits, vegetables, and fiber.  -Stressed the importance of regular exercise.   -Substance Abuse: Discussed cessation/primary prevention of tobacco, alcohol, or other drug use; driving or other dangerous activities under the influence; availability of treatment for abuse.   -Injury prevention: Discussed safety belts, safety helmets, smoke detector, smoking near bedding or upholstery.   -Sexuality: Discussed sexually transmitted diseases, partner selection, use of condoms, avoidance of unintended pregnancy and contraceptive alternatives.   -Dental health: Discussed importance of regular tooth brushing, flossing, and dental  visits.  -Health maintenance and immunizations reviewed. Please refer to Health maintenance section.  Return to care in 1 year for next preventative visit.     Subjective:  HPI:  She has no acute complaints today.   Lifestyle Diet: Doing well with cutting out sugar and carbs. Exercise: Exercises routinely      11/14/2021    9:04 AM  Depression screen PHQ 2/9  Decreased Interest 0  Down, Depressed, Hopeless 0  PHQ - 2 Score 0    Health Maintenance Due  Topic Date Due   Hepatitis C Screening  Never done   FOOT EXAM  11/10/2019   MAMMOGRAM  11/06/2021     ROS: Per HPI, otherwise a complete review of systems was negative.   PMH:  The following were reviewed and entered/updated in epic: Past Medical History:  Diagnosis Date   CHF (congestive heart failure) (HCC)    Diabetes mellitus without complication (HCC)    Type II   History of chicken pox    Hypertension    Kidney stones    Patient Active Problem List   Diagnosis Date Noted   PVC's (premature ventricular contractions) 02/12/2020   Low TSH level 12/25/2019   CHF (congestive heart failure) (HCC) 12/24/2019   Type 2 diabetes mellitus (HCC) 04/21/2016   Hypertension associated with diabetes (HCC) 03/17/2016   Past Surgical History:  Procedure Laterality Date   ANKLE SURGERY     PVC ABLATION N/A 01/31/2020   Procedure: PVC ABLATION;  Surgeon: Lanier Prude, MD;  Location: [facility] INVASIVE CV LAB;  Service: Cardiovascular;  Laterality: N/A;   RIGHT/LEFT HEART CATH AND CORONARY ANGIOGRAPHY N/A 02/27/2020   Procedure: RIGHT/LEFT HEART CATH AND CORONARY ANGIOGRAPHY;  Surgeon: Dolores Patty, MD;  Location: [facility] INVASIVE  CV LAB;  Service: Cardiovascular;  Laterality: N/A;    Family History  Problem Relation Age of Onset   Hyperlipidemia Mother    Hypertension Father    Diabetes Father    Cancer Paternal Grandmother     Medications- reviewed and updated Current Outpatient Medications  Medication Sig  Dispense Refill   benzoyl peroxide-erythromycin (BENZAMYCIN) gel Apply 1 application topically daily.      ELDERBERRY PO Take 1 tablet by mouth daily.     eplerenone (INSPRA) 25 MG tablet TAKE 1 TABLET(25 MG) BY MOUTH DAILY 90 tablet 3   ferrous sulfate 324 MG TBEC Take 324 mg by mouth.     furosemide (LASIX) 40 MG tablet Take 1 tablet (40 mg total) by mouth every other day. 90 tablet 1   GARLIC PO Take 1 tablet by mouth daily.     metFORMIN (GLUCOPHAGE-XR) 500 MG 24 hr tablet Take 1 tablet (500 mg total) by mouth daily with breakfast. 60 tablet 2   metoprolol succinate (TOPROL-XL) 50 MG 24 hr tablet Take 1.5 tablets (75 mg total) by mouth at bedtime. Take with or immediately following a meal. 90 tablet 3   Multiple Vitamin (MULTIVITAMIN) capsule Take 1 capsule by mouth daily.     Omega-3 Fatty Acids (FISH OIL) 1200 MG CPDR Take 2,400 capsules by mouth daily.      Potassium Chloride ER 20 MEQ TBCR TAKE 1 TABLET BY MOUTH EVERY OTHER DAY 90 tablet 0   sacubitril-valsartan (ENTRESTO) 49-51 MG Take 1 tablet by mouth 2 (two) times daily. CALL OFFICE TO SCHEDULE FOLLOW UP APPOINTMENT 60 tablet 3   No current facility-administered medications for this visit.    Allergies-reviewed and updated No Known Allergies  Social History   Socioeconomic History   Marital status: Divorced    Spouse name: Not on file   Number of children: 3   Years of education: 14   Highest education level: Not on file  Occupational History   Occupation: Actor   Tobacco Use   Smoking status: Never   Smokeless tobacco: Never  Vaping Use   Vaping Use: Never used  Substance and Sexual Activity   Alcohol use: Not Currently    Comment: Rarely   Drug use: No   Sexual activity: Yes  Other Topics Concern   Not on file  Social History Narrative   Not on file   Social Determinants of Health   Financial Resource Strain: Not on file  Food Insecurity: No Food Insecurity (01/11/2020)   Hunger  Vital Sign    Worried About Running Out of Food in the Last Year: Never true    Ran Out of Food in the Last Year: Never true  Transportation Needs: No Transportation Needs (01/11/2020)   PRAPARE - Administrator, Civil Service (Medical): No    Lack of Transportation (Non-Medical): No  Physical Activity: Not on file  Stress: Not on file  Social Connections: Not on file        Objective:  Physical Exam: BP 100/64   Pulse (!) 51   Temp 97.6 F (36.4 C) (Temporal)   Ht 5\' 8"  (1.727 m)   Wt 164 lb 9.6 oz (74.7 kg)   SpO2 100%   BMI 25.03 kg/m   Body mass index is 25.03 kg/m. Wt Readings from Last 3 Encounters:  11/14/21 164 lb 9.6 oz (74.7 kg)  08/22/21 173 lb 9.6 oz (78.7 kg)  05/16/21 183 lb 9.6 oz (83.3 kg)  Gen: NAD, resting comfortably HEENT: TMs normal bilaterally. OP clear. No thyromegaly noted.  CV: RRR with no murmurs appreciated Pulm: NWOB, CTAB with no crackles, wheezes, or rhonchi GI: Normal bowel sounds present. Soft, Nontender, Nondistended. MSK: no edema, cyanosis, or clubbing noted Skin: warm, dry Neuro: CN2-12 grossly intact. Strength 5/5 in upper and lower extremities. Reflexes symmetric and intact bilaterally.  Psych: Normal affect and thought content     Savannah Morales M. [name], MD 11/14/2021 9:38 AM

## 2021-11-14 NOTE — Assessment & Plan Note (Signed)
Blood pressure at goal today on Entresto 49-51 twice daily, eplerenone 25 mg daily, and metoprolol succinate 75 mg nightly.

## 2021-11-14 NOTE — Patient Instructions (Signed)
It was very nice to see you today!  Your A1c looks great today.  Please keep up the good work with your diet and exercise.  It is okay for you to stop the metformin.  I will see you back in 6 months and we can recheck your A1c at that time.  Please come back to see [place] sooner if needed  Take care, Dr Jerline Pain  PLEASE NOTE:  If you had any lab tests please let us know if you have not heard back within a few days. You may see your results on mychart before we have a chance to review them but we will give you a call once they are reviewed by [place]. If we ordered any referrals today, please let us know if you have not heard from their office within the next week.   Please try these tips to maintain a healthy lifestyle:  Eat at least 3 REAL meals and 1-2 snacks per day.  Aim for no more than 5 hours between eating.  If you eat breakfast, please do so within one hour of getting up.   Each meal should contain half fruits/vegetables, one quarter protein, and one quarter carbs (no bigger than a computer mouse)  Cut down on sweet beverages. This includes juice, soda, and sweet tea.   Drink at least 1 glass of water with each meal and aim for at least 8 glasses per day  Exercise at least 150 minutes every week.    Preventive Care 29-36 Years Old, Female Preventive care refers to lifestyle choices and visits with your health care provider that can promote health and wellness. Preventive care visits are also called wellness exams. What can I expect for my preventive care visit? Counseling Your health care provider may ask you questions about your: Medical history, including: Past medical problems. Family medical history. Pregnancy history. Current health, including: Menstrual cycle. Method of birth control. Emotional well-being. Home life and relationship well-being. Sexual activity and sexual health. Lifestyle, including: Alcohol, nicotine or tobacco, and drug use. Access to  firearms. Diet, exercise, and sleep habits. Work and work Statistician. Sunscreen use. Safety issues such as seatbelt and bike helmet use. Physical exam Your health care provider will check your: Height and weight. These may be used to calculate your BMI (body mass index). BMI is a measurement that tells if you are at a healthy weight. Waist circumference. This measures the distance around your waistline. This measurement also tells if you are at a healthy weight and may help predict your risk of certain diseases, such as type 2 diabetes and high blood pressure. Heart rate and blood pressure. Body temperature. Skin for abnormal spots. What immunizations do I need?  Vaccines are usually given at various ages, according to a schedule. Your health care provider will recommend vaccines for you based on your age, medical history, and lifestyle or other factors, such as travel or where you work. What tests do I need? Screening Your health care provider may recommend screening tests for certain conditions. This may include: Lipid and cholesterol levels. Diabetes screening. This is done by checking your blood sugar (glucose) after you have not eaten for a while (fasting). Pelvic exam and Pap test. Hepatitis B test. Hepatitis C test. HIV (human immunodeficiency virus) test. STI (sexually transmitted infection) testing, if you are at risk. Lung cancer screening. Colorectal cancer screening. Mammogram. Talk with your health care provider about when you should start having regular mammograms. This may depend on whether you  have a family history of breast cancer. BRCA-related cancer screening. This may be done if you have a family history of breast, ovarian, tubal, or peritoneal cancers. Bone density scan. This is done to screen for osteoporosis. Talk with your health care provider about your test results, treatment options, and if necessary, the need for more tests. Follow these instructions at  home: Eating and drinking  Eat a diet that includes fresh fruits and vegetables, whole grains, lean protein, and low-fat dairy products. Take vitamin and mineral supplements as recommended by your health care provider. Do not drink alcohol if: Your health care provider tells you not to drink. You are pregnant, may be pregnant, or are planning to become pregnant. If you drink alcohol: Limit how much you have to 0-1 drink a day. Know how much alcohol is in your drink. In the [place] one drink equals one 12 oz bottle of beer (355 mL), one 5 oz glass of wine (148 mL), or one 1 oz glass of hard liquor (44 mL). Lifestyle Brush your teeth every morning and night with fluoride toothpaste. Floss one time each day. Exercise for at least 30 minutes 5 or more days each week. Do not use any products that contain nicotine or tobacco. These products include cigarettes, chewing tobacco, and vaping devices, such as e-cigarettes. If you need help quitting, ask your health care provider. Do not use drugs. If you are sexually active, practice safe sex. Use a condom or other form of protection to prevent STIs. If you do not wish to become pregnant, use a form of birth control. If you plan to become pregnant, see your health care provider for a prepregnancy visit. Take aspirin only as told by your health care provider. Make sure that you understand how much to take and what form to take. Work with your health care provider to find out whether it is safe and beneficial for you to take aspirin daily. Find healthy ways to manage stress, such as: Meditation, yoga, or listening to music. Journaling. Talking to a trusted person. Spending time with friends and family. Minimize exposure to UV radiation to reduce your risk of skin cancer. Safety Always wear your seat belt while driving or riding in a vehicle. Do not drive: If you have been drinking alcohol. Do not ride with someone who has been drinking. When you are  tired or distracted. While texting. If you have been using any mind-altering substances or drugs. Wear a helmet and other protective equipment during sports activities. If you have firearms in your house, make sure you follow all gun safety procedures. Seek help if you have been physically or sexually abused. What's next? Visit your health care provider once a year for an annual wellness visit. Ask your health care provider how often you should have your eyes and teeth checked. Stay up to date on all vaccines. This information is not intended to replace advice given to you by your health care provider. Make sure you discuss any questions you have with your health care provider. Document Revised: 10/09/2020 Document Reviewed: 10/09/2020 Elsevier Patient Education  [place].

## 2021-11-14 NOTE — Assessment & Plan Note (Signed)
Check TSH and T4

## 2021-11-14 NOTE — Assessment & Plan Note (Signed)
A1c improved 5.6.  She is doing a great job with lifestyle modifications.  We will stop her metformin.  We can recheck her A1c in 6 months.

## 2021-11-14 NOTE — Assessment & Plan Note (Signed)
Doing well.  Follows with cardiology.  Recent echo showed EF of 40 to 45%.  She is euvolemic today.  We will continue current medications per cardiology.

## 2021-11-17 ENCOUNTER — Other Ambulatory Visit (HOSPITAL_COMMUNITY): Payer: Self-pay | Admitting: *Deleted

## 2021-11-17 LAB — HEPATITIS C ANTIBODY: Hepatitis C Ab: NONREACTIVE

## 2021-11-17 MED ORDER — ENTRESTO 49-51 MG PO TABS: 1.0000 | ORAL_TABLET | Freq: Two times a day (BID) | ORAL | 3 refills | Status: DC

## 2021-11-18 NOTE — Progress Notes (Signed)
Please inform patient of the following:  Labs are all NORMAL.

## 2021-11-21 ENCOUNTER — Encounter: Payer: Self-pay | Admitting: Family Medicine

## 2021-11-24 ENCOUNTER — Encounter: Payer: Self-pay | Admitting: Family Medicine

## 2021-11-24 NOTE — Telephone Encounter (Signed)
See results note. 

## 2021-11-24 NOTE — Telephone Encounter (Signed)
Please advise 

## 2021-11-24 NOTE — Telephone Encounter (Signed)
Would not expect any of her medications to cause this issue.  Her numbers were close to the normal range and are not a cause for clinical concern at this point.  We can recheck again when she comes back in for her next office visit.

## 2021-11-25 ENCOUNTER — Other Ambulatory Visit (HOSPITAL_COMMUNITY): Payer: Self-pay | Admitting: Internal Medicine

## 2022-01-01 LAB — RESULTS CONSOLE HPV: CHL HPV: NEGATIVE

## 2022-01-01 LAB — HM MAMMOGRAPHY

## 2022-01-01 LAB — HM PAP SMEAR: HM Pap smear: NEGATIVE

## 2022-01-19 ENCOUNTER — Encounter: Payer: Self-pay | Admitting: *Deleted

## 2022-01-20 ENCOUNTER — Other Ambulatory Visit (HOSPITAL_COMMUNITY): Payer: Self-pay | Admitting: Internal Medicine

## 2022-01-23 ENCOUNTER — Encounter: Payer: Self-pay | Admitting: Family Medicine

## 2022-02-10 ENCOUNTER — Telehealth (HOSPITAL_COMMUNITY): Payer: Self-pay | Admitting: Internal Medicine

## 2022-02-10 NOTE — Telephone Encounter (Signed)
Pt called to f/u w/disability forms due on 10/20, please call pt when completed

## 2022-02-12 NOTE — Telephone Encounter (Signed)
Forms completed, signed by Dr Haroldine Laws, and emailed to pt

## 2022-03-02 NOTE — Progress Notes (Signed)
ADVANCED HF CLINIC NOTE  PCP: Vivi Barrack, MD Primary Cardiologist: Dr. Audie Box [facility]: Dr. Haroldine Laws  EP: Dr. Quentin Ore   Reason for Visit: F/u for chronic systolic heart failure   HPI:  Savannah Morales is a 56 y.o. female with a hx of DM, HTN and systolic HF due to NICM.    Says she was doing fine until she got the COVID vaccine [employer]) in 5/21. Admitted 12/24/19 with worsening SOB and cough. Echo showed EF 35%. Diuresed and GDMT started. No coronary calcium and no CP so cath not felt warranted,    cMRI 9/21 - Severely dilated LV EF 21% with normal wall thickness. Diffuse - RVEF 26%. - Severe left atrial enlargement, moderate right atrial enlargement. - Moderate to severe central mitral regurgitation with regurgitant fraction 48%. 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 Quentin Ore    R/L cath  02/27/20 EF 20-25%. normal cors with well compensated hemodynamics  Ao = 91/65 (79) LV = 95/5 RA = 2 RV = 35/4 PA = 34/14 (21) PCW = 5 Fick cardiac output/index = 6.2/3.1 Thermo CO/CI =5.2/2.6 PVR =2.6 SVR = 952  FA sat = 97% PA sat = 72%  Zio 11/21  NSR. 8 episodes of NSVT, longest lasting 12.7 seconds at rate of 128bpm. Frequent PVC with burden of 13%.  Zio 12/21  NSR avg 83 bpm. No VT. PVC burden 7.3% with at least 3 morphologies of PVCs.  The most dominant PVC had a 5.8% burden.  Echo 2/22 EF 35-40% RV ok  Echo 12/02/20 EF 40-45%  Echo 5/23 EF 40-45%   She is here for f/u. Feels well. No limitations w/ daily activities. Denies dyspnea. No palpitations. No chest pain. Reports full med compliance, BP soft 90/50 and c/w baseline. No orthostatic hypotension. Wt is down since last visit in April from 173>>158 lb from lifestyle modifications/ diet.   EKG today shows NSR 71 bpm, PVC present   Reports h/o snoring.    ROS: All systems negative except as listed in HPI, PMH and Problem List.  SH:  Social History    Socioeconomic History   Marital status: Divorced    Spouse name: Not on file   Number of children: 3   Years of education: 14   Highest education level: Not on file  Occupational History   Occupation: Engineer, petroleum   Tobacco Use   Smoking status: Never   Smokeless tobacco: Never  Vaping Use   Vaping Use: Never used  Substance and Sexual Activity   Alcohol use: Not Currently    Comment: Rarely   Drug use: No   Sexual activity: Yes  Other Topics Concern   Not on file  Social History Narrative   Not on file   Social Determinants of Health   Financial Resource Strain: Not on file  Food Insecurity: No Food Insecurity (01/11/2020)   Hunger Vital Sign    Worried About Running Out of Food in the Last Year: Never true    Ran Out of Food in the Last Year: Never true  Transportation Needs: No Transportation Needs (01/11/2020)   PRAPARE - Hydrologist (Medical): No    Lack of Transportation (Non-Medical): No  Physical Activity: Not on file  Stress: Not on file  Social Connections: Not on file  Intimate Partner Violence: Not on file    FH:  Family  History  Problem Relation Age of Onset   Hyperlipidemia Mother    Hypertension Father    Diabetes Father    Cancer Paternal Grandmother     Past Medical History:  Diagnosis Date   CHF (congestive heart failure) ([place])    Diabetes mellitus without complication ([place])    Type II   History of chicken pox    Hypertension    Kidney stones     Current Outpatient Medications  Medication Sig Dispense Refill   benzoyl peroxide-erythromycin (BENZAMYCIN) gel Apply 1 application topically daily.      ELDERBERRY PO Take 1 tablet by mouth daily.     eplerenone (INSPRA) 25 MG tablet TAKE 1 TABLET(25 MG) BY MOUTH DAILY 90 tablet 3   ferrous sulfate 324 MG TBEC Take 324 mg by mouth.     furosemide (LASIX) 40 MG tablet Take 1 tablet (40 mg total) by mouth every other day. 90 tablet 1   GARLIC PO  Take 1 tablet by mouth daily.     metFORMIN (GLUCOPHAGE-XR) 500 MG 24 hr tablet Take 1 tablet (500 mg total) by mouth daily with breakfast. 60 tablet 2   metoprolol succinate (TOPROL-XL) 50 MG 24 hr tablet Take 1.5 tablets (75 mg total) by mouth at bedtime. Take with or immediately following a meal. 90 tablet 2   Multiple Vitamin (MULTIVITAMIN) capsule Take 1 capsule by mouth daily.     Omega-3 Fatty Acids (FISH OIL) 1200 MG CPDR Take 2,400 capsules by mouth daily.      Potassium Chloride ER 20 MEQ TBCR TAKE 1 TABLET BY MOUTH EVERY OTHER DAY 90 tablet 3   sacubitril-valsartan (ENTRESTO) 49-51 MG Take 1 tablet by mouth 2 (two) times daily. 60 tablet 3   No current facility-administered medications for this encounter.    There were no vitals filed for this visit.   Wt Readings from Last 3 Encounters:  11/14/21 74.7 kg (164 lb 9.6 oz)  08/22/21 78.7 kg (173 lb 9.6 oz)  05/16/21 83.3 kg (183 lb 9.6 oz)    PHYSICAL EXAM: General:  Well appearing. No respiratory difficulty HEENT: normal Neck: supple. no JVD. Carotids 2+ bilat; no bruits. No lymphadenopathy or thyromegaly appreciated. Cor: PMI nondisplaced. Regular rate & rhythm. No rubs, gallops or murmurs. Lungs: clear Abdomen: soft, nontender, nondistended. No hepatosplenomegaly. No bruits or masses. Good bowel sounds. Extremities: no cyanosis, clubbing, rash, edema Neuro: alert & oriented x 3, cranial nerves grossly intact. moves all 4 extremities w/o difficulty. Affect pleasant.   ECG NSR 71 bpm w/ 1 PVC. Personally reviewed   ASSESSMENT & PLAN:  1. Chronic combined systolic and diastolic CHF: - Diagnosed 8/21 EF 30-35% - cMRI 9/21 LVEF 21% RV EF 26% mod-sev MR. No LGE - R/L cath  02/27/20 EF 20-25%. normal cors with well compensated hemodynamics - Felt to have PVC CM - s/p PVC ablation 01/31/20. Zio 11/21 13% PVC. Zio 12/21 7.3% PVCs - Echo 2/22 EF 35-40% RV ok - Echo 8/22 EF 40-45% - Echo 5/23 EF 40-45% - Doing very well.  NYHA I  - Euvolemic on exam  - Continue lasix 40 mg every other day. OK to take extra prn. - Continue Entresto to 49/51 bid. BP too soft for titration  - Off Jardiance due to yeast infections - Continue Toprol 75mg  daily. Arlyce Harman switched to eplerenone 25 daily due to gynecomastia. - As above, PVC cardiomyopathy seems to be the smoking gun here but EF not fully improved since ablation. Repeat Zio  patch (12/21) PVC burden 7.3%, with most dominant PVC had 5.8% - will plan to repeat 2 wk Zio to reassess burden. May need addition of mexilitene for more complete suppression of PVCs   2.  Frequent PVCs: -  s/p PVC ablation 01/31/20 with Dr. Quentin Ore - Zio 11/21  NSR. 8 episodes of NSVT, longest lasting 12.7 seconds at rate of 128bpm. Frequent PVC with burden of 13%. - Zio 12/21  NSR avg 83 bpm. No VT. PVC burden 7.3% with at least 3 morphologies of PVCs.  The most dominant PVC had a 5.8% burden. - has seen Dr. Quentin Ore and she is hesitant to start AAD or repeat ablation.  - PVC noted on today's EKG - Plan repeat 2wk Zio. May need addition of mexiletine vs repeat ablation if still high burden  - arrange sleep study to r/o OSA   Lyda Jester, PA-C  03/02/22 10:43 PM     Patient seen and examined with the above-signed Advanced Practice Provider and/or Housestaff. I personally reviewed laboratory data, imaging studies and relevant notes. I independently examined the patient and formulated the important aspects of the plan. I have edited the note to reflect any of my changes or salient points. I have personally discussed the plan with the patient and/or family.  Doing well. NYHA I. EF stable on echo 40-45% (personally reviewed). Mild snoring. Still with PVCs on ECG  General:  Well appearing. No resp difficulty HEENT: normal Neck: supple. no JVD. Carotids 2+ bilat; no bruits. No lymphadenopathy or thryomegaly appreciated. Cor: PMI nondisplaced. Regular rate & rhythm. No rubs, gallops or  murmurs. Lungs: clear Abdomen: soft, nontender, nondistended. No hepatosplenomegaly. No bruits or masses. Good bowel sounds. Extremities: no cyanosis, clubbing, rash, edema Neuro: alert & orientedx3, cranial nerves grossly intact. moves all 4 extremities w/o difficulty. Affect pleasant  EF still mildly reduced. Will check sleep study and repeat Zio to requantify PVC burden.   Glori Bickers, MD  12:26 PM

## 2022-03-03 ENCOUNTER — Encounter (HOSPITAL_COMMUNITY): Payer: Self-pay | Admitting: Internal Medicine

## 2022-03-03 ENCOUNTER — Other Ambulatory Visit (HOSPITAL_COMMUNITY): Payer: Self-pay | Admitting: Internal Medicine

## 2022-03-03 ENCOUNTER — Ambulatory Visit (HOSPITAL_COMMUNITY)
Admission: RE | Admit: 2022-03-03 | Discharge: 2022-03-03 | Disposition: A | Discharge status: Home / Self Care | Payer: 59 | Source: Ambulatory Visit | Attending: Internal Medicine | Admitting: Internal Medicine

## 2022-03-03 ENCOUNTER — Inpatient Hospital Stay (HOSPITAL_COMMUNITY)
Admission: RE | Admit: 2022-03-03 | Discharge: 2022-03-03 | Disposition: A | Discharge status: Home / Self Care | Payer: 59 | Source: Ambulatory Visit | Attending: Internal Medicine | Admitting: Internal Medicine

## 2022-03-03 VITALS — BP 90/50 | HR 71 | Wt 158.2 lb

## 2022-03-03 DIAGNOSIS — I5042 Chronic combined systolic (congestive) and diastolic (congestive) heart failure: Secondary | ICD-10-CM | POA: Diagnosis not present

## 2022-03-03 DIAGNOSIS — I110 Hypertensive heart disease with heart failure: Secondary | ICD-10-CM | POA: Diagnosis not present

## 2022-03-03 DIAGNOSIS — G4719 Other hypersomnia: Secondary | ICD-10-CM

## 2022-03-03 DIAGNOSIS — Z79899 Other long term (current) drug therapy: Secondary | ICD-10-CM | POA: Diagnosis not present

## 2022-03-03 DIAGNOSIS — I340 Nonrheumatic mitral (valve) insufficiency: Secondary | ICD-10-CM | POA: Diagnosis not present

## 2022-03-03 DIAGNOSIS — I5022 Chronic systolic (congestive) heart failure: Secondary | ICD-10-CM | POA: Diagnosis not present

## 2022-03-03 DIAGNOSIS — I493 Ventricular premature depolarization: Secondary | ICD-10-CM | POA: Insufficient documentation

## 2022-03-03 DIAGNOSIS — R002 Palpitations: Secondary | ICD-10-CM

## 2022-03-03 DIAGNOSIS — R0683 Snoring: Secondary | ICD-10-CM | POA: Insufficient documentation

## 2022-03-03 LAB — BASIC METABOLIC PANEL
Anion gap: 12 (ref 5–15)
BUN: 10 mg/dL (ref 6–20)
CO2: 25 mmol/L (ref 22–32)
Calcium: 9.9 mg/dL (ref 8.9–10.3)
Chloride: 104 mmol/L (ref 98–111)
Creatinine, Ser: 0.73 mg/dL (ref 0.44–1.00)
GFR, Estimated: >60 mL/min (ref >60)
Glucose, Bld: 105 mg/dL — ABNORMAL HIGH (ref 70–99)
Potassium: 4 mmol/L (ref 3.5–5.1)
Sodium: 141 mmol/L (ref 135–145)

## 2022-03-03 LAB — MAGNESIUM: Magnesium: 2 mg/dL (ref 1.7–2.4)

## 2022-03-03 NOTE — Patient Instructions (Signed)
Medication Changes:  None, continue current medications  Lab Work:  Labs done today, your results will be available in MyChart, we will contact you for abnormal readings.  Testing/Procedures:  Your provider has recommended that  you wear a Zio Patch for 14 days.  This monitor will record your heart rhythm for our review.  IF you have any symptoms while wearing the monitor please press the button.  If you have any issues with the patch or you notice a red or orange light on it please call the company at [phone].  Once you remove the patch please mail it back to the company as soon as possible so we can get the results.  Your provider has recommended that you have a home sleep study.  We have provided you with the equipment in our office today. Please download the app and follow the instructions. YOUR PIN NUMBER IS: [id number]. Once you have completed the test you just dispose of the equipment, the information is automatically uploaded to [place] via blue-tooth technology. If your test is positive for sleep apnea and you need a home CPAP machine you will be contacted by Dr Theodosia Blender office [facility]) to set this up.  Referrals:  none  Special Instructions // Education:  Do the following things EVERYDAY: Weigh yourself in the morning before breakfast. Write it down and keep it in a log. Take your medicines as prescribed Eat low salt foods--Limit salt (sodium) to 2000 mg per day.  Stay as active as you can everyday Limit all fluids for the day to less than 2 liters   Follow-Up in: 4 weeks  At the [facility], you and your health needs are our priority. We have a designated team specialized in the treatment of Heart Failure. This Care Team includes your primary Heart Failure Specialized Cardiologist (physician), Advanced Practice Providers (APPs- Physician Assistants and Nurse Practitioners), and Pharmacist who all work together to provide you with the care you need, when  you need it.   You may see any of the following providers on your designated Care Team at your next follow up:  Dr. Glori Bickers Dr. Loralie Champagne Dr. Roxana Hires, NP Lyda Jester, [place] [facility] [place], [place] Forestine Na, NP Audry Riles, PharmD   Please be sure to bring in all your medications bottles to every appointment.   Need to Contact us:  If you have any questions or concerns before your next appointment please send [place] a message through [place] or call our office at [phone].    TO LEAVE A MESSAGE FOR THE NURSE SELECT OPTION 2, PLEASE LEAVE A MESSAGE INCLUDING: YOUR NAME DATE OF BIRTH CALL BACK NUMBER REASON FOR CALL**this is important as we prioritize the call backs  YOU WILL RECEIVE A CALL BACK THE SAME DAY AS LONG AS YOU CALL BEFORE 4:00 PM

## 2022-03-06 ENCOUNTER — Other Ambulatory Visit (HOSPITAL_COMMUNITY): Payer: Self-pay | Admitting: Internal Medicine

## 2022-03-13 ENCOUNTER — Telehealth: Payer: Self-pay | Admitting: *Deleted

## 2022-03-13 NOTE — Telephone Encounter (Signed)
Notified [name] ok to activate itamar device.  

## 2022-03-23 ENCOUNTER — Telehealth (HOSPITAL_COMMUNITY): Payer: Self-pay | Admitting: Surgery

## 2022-03-23 NOTE — Telephone Encounter (Signed)
I attempted to reach patient in reference to ordered home sleep test.  I asked that she call back with her device serial number so that it can be registered as this step was omitted last time she was in clinic.  I will await her call and hopefully at that time she can complete the study.

## 2022-03-25 NOTE — Telephone Encounter (Signed)
Sorry Daphane, I do not have this pt in my book. Burna Mortimer probably sent to me in error.

## 2022-03-27 ENCOUNTER — Telehealth (HOSPITAL_COMMUNITY): Payer: Self-pay | Admitting: Surgery

## 2022-03-27 NOTE — Telephone Encounter (Signed)
I called patient in reference to the ordered home sleep study.  I left a message for a return call as we do not have the information documented in website for her to complete the study.

## 2022-03-27 NOTE — Addendum Note (Signed)
Encounter addended by: Crissie Figures, RN on: 03/27/2022 10:53 AM  Actions taken: Imaging Exam ended

## 2022-03-30 ENCOUNTER — Telehealth (HOSPITAL_COMMUNITY): Payer: Self-pay | Admitting: Surgery

## 2022-03-30 NOTE — Progress Notes (Signed)
Advanced Heart Failure Clinic Note        PCP: Ardith Dark, MD Primary Cardiologist: Dr. Flora Lipps HF Cardiologist: Dr. Gala Romney  EP: Dr. Lalla Brothers   Reason for Visit: F/u for chronic systolic heart failure   HPI: Savannah Morales is a 56 y.o. female with a hx of DM, HTN and systolic HF due to NICM.    Says she was doing fine until she got the COVID vaccine [employer]) in 5/21. Admitted 12/24/19 with worsening SOB and cough. Echo showed EF 35%. Diuresed and GDMT started. No coronary calcium and no CP so cath not felt warranted,    cMRI 9/21 - Severely dilated LV EF 21% with normal wall thickness. Diffuse - RVEF 26%. - Severe left atrial enlargement, moderate right atrial enlargement. - Moderate to severe central mitral regurgitation with regurgitant fraction 48%. 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 Lalla Brothers    R/L cath  02/27/20 EF 20-25%. normal cors with well compensated hemodynamics  Ao = 91/65 (79) LV = 95/5 RA = 2 RV = 35/4 PA = 34/14 (21) PCW = 5 Fick cardiac output/index = 6.2/3.1 Thermo CO/CI =5.2/2.6 PVR =2.6 SVR = 952  FA sat = 97% PA sat = 72%  Zio (11/21) NSR. 8 episodes of NSVT, longest lasting 12.7 seconds at rate of 128bpm. Frequent PVC with burden of 13%.  Zio (12/21) NSR avg 83 bpm. No VT. PVC burden 7.3% with at least 3 morphologies of PVCs.  The most dominant PVC had a 5.8% burden.  Echo 2/22 EF 35-40% RV ok   Echo 12/02/20 EF 40-45%   Echo 5/23 EF 40-45%   Follow up 11/23, repeat Zio placed to quantify PVC burden, which showed 10.5% VE burden.  Today she returns for HF follow up. Overall feeling fine. She is not short of breath ADLs or work duties (works at a [employer] firm). She has mild SOB walking up steps. Denies palpitations, CP, dizziness, edema, or PND/Orthopnea. Appetite ok. No fever or chills. Weight at home 156 pounds. Taking all medications. Has not been able to complete home sleep  study.  ROS: All systems negative except as listed in HPI, PMH and Problem List.  SH:  Social History   Socioeconomic History   Marital status: Divorced    Spouse name: Not on file   Number of children: 3   Years of education: 14   Highest education level: Not on file  Occupational History   Occupation: Actor   Tobacco Use   Smoking status: Never   Smokeless tobacco: Never  Vaping Use   Vaping Use: Never used  Substance and Sexual Activity   Alcohol use: Not Currently    Comment: Rarely   Drug use: No   Sexual activity: Yes  Other Topics Concern   Not on file  Social History Narrative   Not on file   Social Determinants of Health   Financial Resource Strain: Not on file  Food Insecurity: No Food Insecurity (01/11/2020)   Hunger Vital Sign    Worried About Running Out of Food in the Last Year: Never true    Ran Out of Food in the Last Year: Never true  Transportation Needs: No Transportation Needs (01/11/2020)   PRAPARE - Administrator, Civil Service (Medical): No    Lack of Transportation (Non-Medical): No  Physical Activity: Not on file  Stress: Not on file  Social  Connections: Not on file  Intimate Partner Violence: Not on file    FH:  Family History  Problem Relation Age of Onset   Hyperlipidemia Mother    Hypertension Father    Diabetes Father    Cancer Paternal Grandmother     Past Medical History:  Diagnosis Date   CHF (congestive heart failure) (HCC)    Diabetes mellitus without complication (HCC)    Type II   History of chicken pox    Hypertension    Kidney stones     Current Outpatient Medications  Medication Sig Dispense Refill   benzoyl peroxide-erythromycin (BENZAMYCIN) gel Apply 1 application topically daily.      BLACK CURRANT SEED OIL PO Take 1 tablet by mouth daily.     ENTRESTO 49-51 MG TAKE 1 TABLET BY MOUTH TWICE DAILY 60 tablet 3   eplerenone (INSPRA) 25 MG tablet TAKE 1 TABLET(25 MG) BY MOUTH  DAILY 90 tablet 3   furosemide (LASIX) 40 MG tablet Take 1 tablet (40 mg total) by mouth every other day. 90 tablet 1   metFORMIN (GLUCOPHAGE-XR) 500 MG 24 hr tablet Take 1 tablet (500 mg total) by mouth daily with breakfast. 60 tablet 2   metoprolol succinate (TOPROL-XL) 50 MG 24 hr tablet Take 1.5 tablets (75 mg total) by mouth at bedtime. Take with or immediately following a meal. 90 tablet 2   Potassium Chloride ER 20 MEQ TBCR TAKE 1 TABLET BY MOUTH EVERY OTHER DAY 90 tablet 3   No current facility-administered medications for this encounter.   BP 120/80   Pulse 68   Wt 71.8 kg (158 lb 6.4 oz)   SpO2 100%   BMI 24.08 kg/m   Wt Readings from Last 3 Encounters:  04/01/22 71.8 kg (158 lb 6.4 oz)  03/03/22 71.8 kg (158 lb 3.2 oz)  11/14/21 74.7 kg (164 lb 9.6 oz)   PHYSICAL EXAM: General:  NAD. No resp difficulty HEENT: Normal Neck: Supple. No JVD. Carotids 2+ bilat; no bruits. No lymphadenopathy or thryomegaly appreciated. Cor: PMI nondisplaced. Irregular rate & rhythm. No rubs, gallops or murmurs. Lungs: Clear Abdomen: Soft, nontender, nondistended. No hepatosplenomegaly. No bruits or masses. Good bowel sounds. Extremities: No cyanosis, clubbing, rash, edema Neuro: Alert & oriented x 3, cranial nerves grossly intact. Moves all 4 extremities w/o difficulty. Affect pleasant.  ECG (personally reviewed): SR with frequent PVCs  ASSESSMENT & PLAN: 1. Chronic combined systolic and diastolic CHF: - Diagnosed 8/21 EF 30-35% - cMRI (9/21): LVEF 21% RV EF 26% mod-sev MR. No LGE - R/LHC (02/27/20): EF 20-25%. normal cors with well compensated hemodynamics - Felt to have PVC CM  - s/p PVC ablation 01/31/20. Zio (11/21) 13% PVC. Zio (12/21) 7.3% PVCs - Echo (2/22) EF 35-40% RV ok - Echo (8/22) EF 40-45% - Echo (5/23) EF 40-45% - NYHA I, volume looks good today. - Continue Lasix 40 mg every other day. OK to take extra prn. - Continue Entresto 49/51 bid.  - Continue eplerenone 25 mg  daily (off spiro due to gynecomastia) - Continue Toprol 75 mg daily. - Off Jardiance due to yeast infections - As above, PVC cardiomyopathy seems to be the smoking gun here, but EF not fully improved since ablation. Zio patch (12/21) PVC burden 7.3%, with most dominant PVC had 5.8%. Repeat Zio 11/23 showed PVC burden 10% (see below). - Labs today.  2.  Frequent PVCs: - s/p PVC ablation 01/31/20 with Dr. Lalla Brothers - Zio (11/21) NSR. 8 episodes of NSVT, longest  lasting 12.7 seconds at rate of 128bpm. Frequent PVC with burden of 13%. - Zio (12/21) NSR avg 83 bpm. No VT. PVC burden 7.3% with at least 3 morphologies of PVCs.  The most dominant PVC had a 5.8% burden. - Zio 11/23 NSR, 18 runs of VT, 2nd AVB present, isolated VE (10.5%). - Has seen Dr. Lalla Brothers and she is hesitant to repeat ablation.  - PVCs noted on today's EKG - Start mexelitine 200 mg bid. Refer back to Dr. Lalla Brothers to discuss options (continue AAD vs repeat ablation). Discussed with Dr. Gala Romney - She has home sleep study, needs to complete. - Labs today, recent Mag 2.0  Follow up in 6 months with Dr. Mickle Plumb, FNP-BC 04/01/22 2:43 PM

## 2022-03-30 NOTE — Telephone Encounter (Signed)
Patient called back and left a message with serial number information.  I called her back and left message to proceed with the sleep study and that insurance prior Berkley Harvey is not needed.

## 2022-04-01 ENCOUNTER — Encounter (HOSPITAL_COMMUNITY): Payer: Self-pay

## 2022-04-01 ENCOUNTER — Ambulatory Visit (HOSPITAL_COMMUNITY)
Admission: RE | Admit: 2022-04-01 | Discharge: 2022-04-01 | Disposition: A | Discharge status: Home / Self Care | Payer: 59 | Source: Ambulatory Visit | Attending: Family Medicine | Admitting: Family Medicine

## 2022-04-01 VITALS — BP 120/80 | HR 68 | Wt 158.4 lb

## 2022-04-01 DIAGNOSIS — Z79899 Other long term (current) drug therapy: Secondary | ICD-10-CM | POA: Diagnosis not present

## 2022-04-01 DIAGNOSIS — I5022 Chronic systolic (congestive) heart failure: Secondary | ICD-10-CM

## 2022-04-01 DIAGNOSIS — N62 Hypertrophy of breast: Secondary | ICD-10-CM | POA: Diagnosis not present

## 2022-04-01 DIAGNOSIS — I472 Ventricular tachycardia, unspecified: Secondary | ICD-10-CM | POA: Insufficient documentation

## 2022-04-01 DIAGNOSIS — I110 Hypertensive heart disease with heart failure: Secondary | ICD-10-CM | POA: Diagnosis present

## 2022-04-01 DIAGNOSIS — I5042 Chronic combined systolic (congestive) and diastolic (congestive) heart failure: Secondary | ICD-10-CM | POA: Diagnosis not present

## 2022-04-01 DIAGNOSIS — E119 Type 2 diabetes mellitus without complications: Secondary | ICD-10-CM | POA: Insufficient documentation

## 2022-04-01 DIAGNOSIS — I493 Ventricular premature depolarization: Secondary | ICD-10-CM | POA: Diagnosis not present

## 2022-04-01 LAB — BASIC METABOLIC PANEL
Anion gap: 6 (ref 5–15)
BUN: 10 mg/dL (ref 6–20)
CO2: 27 mmol/L (ref 22–32)
Calcium: 9.7 mg/dL (ref 8.9–10.3)
Chloride: 106 mmol/L (ref 98–111)
Creatinine, Ser: 0.66 mg/dL (ref 0.44–1.00)
GFR, Estimated: >60 mL/min (ref >60)
Glucose, Bld: 69 mg/dL — ABNORMAL LOW (ref 70–99)
Potassium: 4.5 mmol/L (ref 3.5–5.1)
Sodium: 139 mmol/L (ref 135–145)

## 2022-04-01 MED ORDER — MEXILETINE HCL 200 MG PO CAPS: 200.0000 mg | ORAL_CAPSULE | Freq: Two times a day (BID) | ORAL | 3 refills | Status: DC

## 2022-04-01 NOTE — Patient Instructions (Signed)
START Mexiletine 200 mg Twice daily  Labs done today, your results will be available in MyChart, we will contact you for abnormal readings.  You have been referred back to Dr. Lalla Brothers. His office will call you to arrange the appointment   Your physician recommends that you schedule a follow-up appointment in: 6 month ( June 2024) ** please call the office in April to arrange your follow up appointment **  If you have any questions or concerns before your next appointment please send [place] a message through [place] or call our office at [phone].    TO LEAVE A MESSAGE FOR THE NURSE SELECT OPTION 2, PLEASE LEAVE A MESSAGE INCLUDING: YOUR NAME DATE OF BIRTH CALL BACK NUMBER REASON FOR CALL**this is important as we prioritize the call backs  YOU WILL RECEIVE A CALL BACK THE SAME DAY AS LONG AS YOU CALL BEFORE 4:00 PM  At the [facility], you and your health needs are our priority. As part of our continuing mission to provide you with exceptional heart care, we have created designated Provider Care Teams. These Care Teams include your primary Cardiologist (physician) and Advanced Practice Providers (APPs- Physician Assistants and Nurse Practitioners) who all work together to provide you with the care you need, when you need it.   You may see any of the following providers on your designated Care Team at your next follow up: Dr Arvilla Meres Dr Marca Ancona Dr. Marcos Eke, NP Robbie Lis, [place] [facility] [place], [place] Brynda Peon, NP Karle Plumber, PharmD   Please be sure to bring in all your medications bottles to every appointment.

## 2022-04-05 ENCOUNTER — Other Ambulatory Visit: Payer: Self-pay | Admitting: Family Medicine

## 2022-04-05 DIAGNOSIS — E119 Type 2 diabetes mellitus without complications: Secondary | ICD-10-CM

## 2022-04-09 ENCOUNTER — Encounter: Payer: Self-pay | Admitting: *Deleted

## 2022-05-18 ENCOUNTER — Ambulatory Visit: Payer: 59 | Admitting: Family Medicine

## 2022-05-19 ENCOUNTER — Encounter: Payer: Self-pay | Admitting: Family Medicine

## 2022-05-19 ENCOUNTER — Ambulatory Visit: Payer: 59 | Admitting: Family Medicine

## 2022-05-19 VITALS — BP 108/65 | HR 67 | Temp 97.5°F | Ht 68.0 in | Wt 157.0 lb

## 2022-05-19 DIAGNOSIS — Z23 Encounter for immunization: Secondary | ICD-10-CM

## 2022-05-19 DIAGNOSIS — E1159 Type 2 diabetes mellitus with other circulatory complications: Secondary | ICD-10-CM

## 2022-05-19 DIAGNOSIS — I152 Hypertension secondary to endocrine disorders: Secondary | ICD-10-CM | POA: Diagnosis not present

## 2022-05-19 DIAGNOSIS — E119 Type 2 diabetes mellitus without complications: Secondary | ICD-10-CM

## 2022-05-19 LAB — POCT GLYCOSYLATED HEMOGLOBIN (HGB A1C): Hemoglobin A1C: 5.9 % — AB (ref 4.0–5.6)

## 2022-05-19 NOTE — Assessment & Plan Note (Signed)
Blood pressure at goal today on Entresto 49-51 twice daily, eplerenone 25 mg daily, metoprolol succinate 75 mg nightly.

## 2022-05-19 NOTE — Patient Instructions (Addendum)
It was very nice to see you today!  Your A1c looks good.  Will continue your current medications.  We will see you back in 6 months for your annual physical.  Come back to see me sooner if needed.  Take care, Dr Jerline Pain  PLEASE NOTE:  If you had any lab tests, please let us know if you have not heard back within a few days. You may see your results on mychart before we have a chance to review them but we will give you a call once they are reviewed by [place].   If we ordered any referrals today, please let us know if you have not heard from their office within the next week.   If you had any urgent prescriptions sent in today, please check with the pharmacy within an hour of our visit to make sure the prescription was transmitted appropriately.   Please try these tips to maintain a healthy lifestyle:  Eat at least 3 REAL meals and 1-2 snacks per day.  Aim for no more than 5 hours between eating.  If you eat breakfast, please do so within one hour of getting up.   Each meal should contain half fruits/vegetables, one quarter protein, and one quarter carbs (no bigger than a computer mouse)  Cut down on sweet beverages. This includes juice, soda, and sweet tea.   Drink at least 1 glass of water with each meal and aim for at least 8 glasses per day  Exercise at least 150 minutes every week.

## 2022-05-19 NOTE — Assessment & Plan Note (Signed)
A1c stable 5.9.  She would like to continue with metformin for now 500 mg daily.  This is reasonable.  She will come back in 6 months for annual physical and we will recheck at that time.

## 2022-05-19 NOTE — Progress Notes (Signed)
   Savannah Morales is a 57 y.o. female who presents today for an office visit.  Assessment/Plan:  New/Acute Problems: Chest Wall Pain Symptoms resolved.  No red flags.  Reassuring exam today.  May have a mild minor cutaneous nerve irritation.  Will continue with watchful waiting.  She will let us know if symptoms return.  Chronic Problems Addressed Today: Type 2 diabetes mellitus (HCC) A1c stable 5.9.  She would like to continue with metformin for now 500 mg daily.  This is reasonable.  She will come back in 6 months for annual physical and we will recheck at that time.   Hypertension associated with diabetes ([place]) Blood pressure at goal today on Entresto 49-51 twice daily, eplerenone 25 mg daily, metoprolol succinate 75 mg nightly.  Flu shot given today.     Subjective:  HPI:  See A/p for status of chronic conditions.    She is doing well.  She has been taking her metformin since her last visit.  She has follow-up with cardiology.  She did have a sensation of burning and stinging on the left side of her chest several weeks ago and this resolved.  She has an upcoming appointment with cardiology in a few weeks.       Objective:  Physical Exam: BP 108/65   Pulse 67   Temp (!) 97.5 F (36.4 C) (Temporal)   Ht 5\' 8"  (1.727 m)   Wt 157 lb (71.2 kg)   SpO2 100%   BMI 23.87 kg/m   Gen: No acute distress, resting comfortably CV: Regular rate and rhythm with no murmurs appreciated Pulm: Normal work of breathing, clear to auscultation bilaterally with no crackles, wheezes, or rhonchi Neuro: Grossly normal, moves all extremities Psych: Normal affect and thought content      Savannah Morales M. Jerline Pain, MD 05/19/2022 2:32 PM

## 2022-05-20 ENCOUNTER — Encounter: Payer: Self-pay | Admitting: Cardiology

## 2022-05-24 ENCOUNTER — Other Ambulatory Visit (HOSPITAL_COMMUNITY): Payer: Self-pay | Admitting: Internal Medicine

## 2022-05-24 NOTE — Progress Notes (Unsigned)
Electrophysiology Office Follow up Visit Note:    Date:  05/24/2022   ID:  Savannah Morales, DOB 1965/10/14, MRN [id number]  PCP:  Vivi Barrack, MD  [place] Cardiologist:  Evalina Field, MD  [facility] Electrophysiologist:  Vickie Epley, MD    Interval History:    Savannah Morales is a 57 y.o. female who presents for a follow up visit. They were last seen in clinic 06/11/2020 for frequent PVC's. Had a prior PVC ablation 01/31/2020 that was partially successful. Ablation was performed in the LVOT/RVOT/Ao only achieving temporary suppression of the PVCs.   She continues to follow with DrBensimohn in the HF clinic. Recent Zio showed 10.5% burden of PVCs with at least 2 morphologies.   EF 40-45% in 08/2021.         Past Medical History:  Diagnosis Date   CHF (congestive heart failure) ([place])    Diabetes mellitus without complication ([place])    Type II   History of chicken pox    Hypertension    Kidney stones     Past Surgical History:  Procedure Laterality Date   ANKLE SURGERY     PVC ABLATION N/A 01/31/2020   Procedure: PVC ABLATION;  Surgeon: Vickie Epley, MD;  Location: [place] [facility];  Service: Cardiovascular;  Laterality: N/A;   RIGHT/LEFT HEART CATH AND CORONARY ANGIOGRAPHY N/A 02/27/2020   Procedure: RIGHT/LEFT HEART CATH AND CORONARY ANGIOGRAPHY;  Surgeon: Jolaine Artist, MD;  Location: [place] [facility];  Service: Cardiovascular;  Laterality: N/A;    Current Medications: No outpatient medications have been marked as taking for the 05/25/22 encounter (Appointment) with Vickie Epley, MD.     Allergies:   Patient has no known allergies.   Social History   Socioeconomic History   Marital status: Divorced    Spouse name: Not on file   Number of children: 3   Years of education: 14   Highest education level: Not on file  Occupational History   Occupation: Engineer, petroleum   Tobacco Use   Smoking status: Never    Smokeless tobacco: Never  Vaping Use   Vaping Use: Never used  Substance and Sexual Activity   Alcohol use: Not Currently    Comment: Rarely   Drug use: No   Sexual activity: Yes  Other Topics Concern   Not on file  Social History Narrative   Not on file   Social Determinants of Health   Financial Resource Strain: Not on file  Food Insecurity: No Food Insecurity (01/11/2020)   Hunger Vital Sign    Worried About Running Out of Food in the Last Year: Never true    Ran Out of Food in the Last Year: Never true  Transportation Needs: No Transportation Needs (01/11/2020)   PRAPARE - Hydrologist (Medical): No    Lack of Transportation (Non-Medical): No  Physical Activity: Not on file  Stress: Not on file  Social Connections: Not on file     Family History: The patient's family history includes Cancer in her paternal grandmother; Diabetes in her father; Hyperlipidemia in her mother; Hypertension in her father.  ROS:   Please see the history of present illness.    All other systems reviewed and are negative.  EKGs/Labs/Other Studies Reviewed:    The following studies were reviewed today:    Recent Labs: 11/14/2021: ALT 13; Hemoglobin 12.3; Platelets 167.0; TSH 0.57 03/03/2022: Magnesium 2.0 04/01/2022: BUN 10;  Creatinine, Ser 0.66; Potassium 4.5; Sodium 139  Recent Lipid Panel    Component Value Date/Time   CHOL 154 11/14/2021 0942   TRIG 61.0 11/14/2021 0942   HDL 59.50 11/14/2021 0942   CHOLHDL 3 11/14/2021 0942   VLDL 12.2 11/14/2021 0942   LDLCALC 82 11/14/2021 0942    Physical Exam:    VS:  There were no vitals taken for this visit.    Wt Readings from Last 3 Encounters:  05/19/22 157 lb (71.2 kg)  04/01/22 158 lb 6.4 oz (71.8 kg)  03/03/22 158 lb 3.2 oz (71.8 kg)     GEN: *** Well nourished, well developed in no acute distress CARDIAC: ***RRR, no murmurs, rubs, gallops RESPIRATORY:  Clear to auscultation without rales,  wheezing or rhonchi         ASSESSMENT:    No diagnosis found. PLAN:    In order of problems listed above:  #Chronic systolic HF NYHA II. Follows with DB. Continue GDMT.  #Frequent PVC's I would not recommend repeat ablation attempt given multiple morphologies and lower burden. I have recommended continuing therapy with mexiletine 200mg  PO twice daily.    Follow up with APP in 6 months.    Medication Adjustments/Labs and Tests Ordered: Current medicines are reviewed at length with the patient today.  Concerns regarding medicines are outlined above.  No orders of the defined types were placed in this encounter.  No orders of the defined types were placed in this encounter.    Signed, Lars Mage, MD, [facility], [facility] 05/24/2022 5:21 PM    Electrophysiology [facility]

## 2022-05-25 ENCOUNTER — Ambulatory Visit: Payer: 59 | Attending: Cardiology | Admitting: Cardiology

## 2022-05-25 ENCOUNTER — Encounter: Payer: Self-pay | Admitting: Cardiology

## 2022-05-25 VITALS — BP 100/60 | HR 67 | Ht 68.0 in | Wt 158.0 lb

## 2022-05-25 DIAGNOSIS — I5022 Chronic systolic (congestive) heart failure: Secondary | ICD-10-CM | POA: Diagnosis not present

## 2022-05-25 DIAGNOSIS — I493 Ventricular premature depolarization: Secondary | ICD-10-CM | POA: Diagnosis not present

## 2022-05-25 NOTE — Patient Instructions (Signed)
Medication Instructions:  Your physician recommends that you continue on your current medications as directed. Please refer to the Current Medication list given to you today.  *If you need a refill on your cardiac medications before your next appointment, please call your pharmacy*  Follow-Up: At  HeartCare, you and your health needs are our priority.  As part of our continuing mission to provide you with exceptional heart care, we have created designated Provider Care Teams.  These Care Teams include your primary Cardiologist (physician) and Advanced Practice Providers (APPs -  Physician Assistants and Nurse Practitioners) who all work together to provide you with the care you need, when you need it.  Your next appointment:   6 month(s)  Provider:   You will see one of the following Advanced Practice Providers on your designated Care Team:   Renee Ursuy, PA-C Michael "Andy" Tillery, PA-C Suzann Riddle, NP    

## 2022-05-25 NOTE — Progress Notes (Signed)
Electrophysiology Office Follow up Visit Note:    Date:  05/25/2022   ID:  Savannah Morales, DOB Jun 19, 1965, MRN [id number]  PCP:  Vivi Barrack, MD  [place] Cardiologist:  Evalina Field, MD  [facility] Electrophysiologist:  Vickie Epley, MD    Interval History:    Savannah Morales is a 57 y.o. female who presents for a follow up visit. They were last seen in clinic 06/11/2020 for frequent PVC's. Had a prior PVC ablation 01/31/2020 that was partially successful. Ablation was performed in the LVOT/RVOT/Ao only achieving temporary suppression of the PVCs.   She continues to follow with DrBensimohn in the HF clinic. Recent Zio showed 10.5% burden of PVCs with at least 2 morphologies.   EF 40-45% in 08/2021.   Today, she reports that she is tolerating her mexiletine 200 MG with no side effects. Her energy level has been great recently. We discussed her EKG results, which showed only 3 skipped beats.  She denies any chest pain, shortness of breath, or peripheral edema. No lightheadedness, headaches, syncope, orthopnea, or PND.       Past Medical History:  Diagnosis Date   CHF (congestive heart failure) ([place])    Diabetes mellitus without complication ([place])    Type II   History of chicken pox    Hypertension    Kidney stones     Past Surgical History:  Procedure Laterality Date   ANKLE SURGERY     PVC ABLATION N/A 01/31/2020   Procedure: PVC ABLATION;  Surgeon: Vickie Epley, MD;  Location: [facility];  Service: Cardiovascular;  Laterality: N/A;   RIGHT/LEFT HEART CATH AND CORONARY ANGIOGRAPHY N/A 02/27/2020   Procedure: RIGHT/LEFT HEART CATH AND CORONARY ANGIOGRAPHY;  Surgeon: Jolaine Artist, MD;  Location: [facility];  Service: Cardiovascular;  Laterality: N/A;    Current Medications: Current Meds  Medication Sig   benzoyl peroxide-erythromycin (BENZAMYCIN) gel Apply 1 application topically daily.    BLACK CURRANT SEED OIL PO Take  1 tablet by mouth daily.   ENTRESTO 49-51 MG TAKE 1 TABLET BY MOUTH TWICE DAILY   eplerenone (INSPRA) 25 MG tablet TAKE 1 TABLET(25 MG) BY MOUTH DAILY   furosemide (LASIX) 40 MG tablet Take 1 tablet (40 mg total) by mouth every other day.   metFORMIN (GLUCOPHAGE-XR) 500 MG 24 hr tablet TAKE 1 TABLET(500 MG) BY MOUTH DAILY WITH BREAKFAST   metoprolol succinate (TOPROL-XL) 50 MG 24 hr tablet TAKE ONE AND ONE HALF TABLETS BY MOUTH AT BEDTIME WITH OR IMMEDIATELY FOLLOWING A MEAL   mexiletine (MEXITIL) 200 MG capsule Take 1 capsule (200 mg total) by mouth 2 (two) times daily.   Potassium Chloride ER 20 MEQ TBCR TAKE 1 TABLET BY MOUTH EVERY OTHER DAY     Allergies:   Patient has no known allergies.   Social History   Socioeconomic History   Marital status: Divorced    Spouse name: Not on file   Number of children: 3   Years of education: 14   Highest education level: Not on file  Occupational History   Occupation: Engineer, petroleum   Tobacco Use   Smoking status: Never   Smokeless tobacco: Never  Vaping Use   Vaping Use: Never used  Substance and Sexual Activity   Alcohol use: Not Currently    Comment: Rarely   Drug use: No   Sexual activity: Yes  Other Topics Concern   Not on file  Social History  Narrative   Not on file   Social Determinants of Health   Financial Resource Strain: Not on file  Food Insecurity: No Food Insecurity (01/11/2020)   Hunger Vital Sign    Worried About Running Out of Food in the Last Year: Never true    Ran Out of Food in the Last Year: Never true  Transportation Needs: No Transportation Needs (01/11/2020)   PRAPARE - Hydrologist (Medical): No    Lack of Transportation (Non-Medical): No  Physical Activity: Not on file  Stress: Not on file  Social Connections: Not on file     Family History: The patient's family history includes Cancer in her paternal grandmother; Diabetes in her father; Hyperlipidemia in  her mother; Hypertension in her father.  ROS:   Please see the history of present illness.     All other systems reviewed and are negative.  EKGs/Labs/Other Studies Reviewed:    The following studies were reviewed today:  EKG shows sinus rhythm with occasional PVCs.  During the long rhythm strip there is a total of 3 PVCs.  PVCs are monomorphic and have a steeply inferior axis and a precordial transition in V3.  Recent Labs: 11/14/2021: ALT 13; Hemoglobin 12.3; Platelets 167.0; TSH 0.57 03/03/2022: Magnesium 2.0 04/01/2022: BUN 10; Creatinine, Ser 0.66; Potassium 4.5; Sodium 139  Recent Lipid Panel    Component Value Date/Time   CHOL 154 11/14/2021 0942   TRIG 61.0 11/14/2021 0942   HDL 59.50 11/14/2021 0942   CHOLHDL 3 11/14/2021 0942   VLDL 12.2 11/14/2021 0942   LDLCALC 82 11/14/2021 0942    Physical Exam:    VS:  BP 100/60   Pulse 67   Ht 5\' 8"  (1.727 m)   Wt 158 lb (71.7 kg)   SpO2 99%   BMI 24.02 kg/m     Wt Readings from Last 3 Encounters:  05/25/22 158 lb (71.7 kg)  05/19/22 157 lb (71.2 kg)  04/01/22 158 lb 6.4 oz (71.8 kg)     GEN:  Well nourished, well developed in no acute distress CARDIAC: RRR, no murmurs, rubs, gallops RESPIRATORY:  Clear to auscultation without rales, wheezing or rhonchi         ASSESSMENT:    1. Chronic systolic congestive heart failure ([place])   2. PVC's (premature ventricular contractions)    PLAN:    In order of problems listed above:  #Chronic systolic HF NYHA II. Follows with DB. Continue GDMT.  #Frequent PVC's I would not recommend repeat ablation attempt given multiple morphologies and lower burden. I have recommended continuing therapy with mexiletine 200mg  PO twice daily.   Follow-up: 6 months with APP  Medication Adjustments/Labs and Tests Ordered: Current medicines are reviewed at length with the patient today.  Concerns regarding medicines are outlined above.  No orders of the defined types were placed in  this encounter.  No orders of the defined types were placed in this encounter.   I,Mitra Faeizi,acting as a Education administrator for Vickie Epley, MD.,have documented all relevant documentation on the behalf of Vickie Epley, MD,as directed by  Vickie Epley, MD while in the presence of Vickie Epley, MD.  I, Vickie Epley, MD, have reviewed all documentation for this visit. The documentation on 05/25/22 for the exam, diagnosis, procedures, and orders are all accurate and complete.   Signed, Lars Mage, MD, [facility], [facility] 05/25/2022 10:10 AM    Electrophysiology [facility]

## 2022-05-26 ENCOUNTER — Encounter: Payer: Self-pay | Admitting: Cardiology

## 2022-06-03 ENCOUNTER — Encounter: Payer: Self-pay | Admitting: Family Medicine

## 2022-07-08 ENCOUNTER — Other Ambulatory Visit: Payer: Self-pay | Admitting: Family Medicine

## 2022-07-08 ENCOUNTER — Other Ambulatory Visit: Payer: Self-pay | Admitting: *Deleted

## 2022-07-08 DIAGNOSIS — E119 Type 2 diabetes mellitus without complications: Secondary | ICD-10-CM

## 2022-07-08 MED ORDER — ENTRESTO 49-51 MG PO TABS: 1.0000 | ORAL_TABLET | Freq: Two times a day (BID) | ORAL | 3 refills | Status: DC

## 2022-07-21 ENCOUNTER — Other Ambulatory Visit (HOSPITAL_COMMUNITY): Payer: Self-pay

## 2022-07-21 MED ORDER — METOPROLOL SUCCINATE ER 50 MG PO TB24: ORAL_TABLET | ORAL | 3 refills | Status: DC

## 2022-10-20 ENCOUNTER — Other Ambulatory Visit (HOSPITAL_COMMUNITY): Payer: Self-pay | Admitting: Internal Medicine

## 2022-11-05 ENCOUNTER — Other Ambulatory Visit: Payer: Self-pay | Admitting: Internal Medicine

## 2022-11-20 ENCOUNTER — Encounter: Payer: 59 | Admitting: Family Medicine

## 2022-11-25 ENCOUNTER — Encounter (INDEPENDENT_AMBULATORY_CARE_PROVIDER_SITE_OTHER): Payer: Self-pay

## 2022-11-26 ENCOUNTER — Other Ambulatory Visit (HOSPITAL_COMMUNITY): Payer: Self-pay

## 2022-11-26 ENCOUNTER — Other Ambulatory Visit: Payer: Self-pay | Admitting: Oncology

## 2022-11-26 DIAGNOSIS — Z006 Encounter for examination for normal comparison and control in clinical research program: Secondary | ICD-10-CM

## 2022-11-26 MED ORDER — EPLERENONE 25 MG PO TABS: 25.0000 mg | ORAL_TABLET | Freq: Every day | ORAL | 1 refills | Status: DC

## 2022-11-30 ENCOUNTER — Other Ambulatory Visit: Payer: Self-pay

## 2022-11-30 ENCOUNTER — Ambulatory Visit: Payer: 59 | Attending: Student | Admitting: Student

## 2022-11-30 ENCOUNTER — Encounter: Payer: Self-pay | Admitting: Student

## 2022-11-30 VITALS — BP 96/64 | HR 64 | Ht 66.0 in | Wt 150.0 lb

## 2022-11-30 DIAGNOSIS — I493 Ventricular premature depolarization: Secondary | ICD-10-CM | POA: Diagnosis not present

## 2022-11-30 DIAGNOSIS — I5022 Chronic systolic (congestive) heart failure: Secondary | ICD-10-CM | POA: Diagnosis not present

## 2022-11-30 NOTE — Progress Notes (Signed)
  Electrophysiology Office Note:   Date:  11/30/2022  ID:  Savannah Morales, DOB 03/14/1966, MRN [id number]  Primary Cardiologist: Reatha Harps, MD Electrophysiologist: Lanier Prude, MD      History of Present Illness:   Savannah Morales is a 57 y.o. female with h/o PVCs, Chronic systolic CHF, and NICM seen today for routine electrophysiology followup.   Since last being seen in our clinic the patient reports doing very well.  she denies chest pain, palpitations, dyspnea, PND, orthopnea, nausea, vomiting, dizziness, syncope, edema, weight gain, or early satiety.   Review of systems complete and found to be negative unless listed in HPI.   EP Information / Studies Reviewed:    EKG is ordered today. Personal review as below.  EKG Interpretation Date/Time:  Monday November 30 2022 11:13:40 EDT Ventricular Rate:  60 PR Interval:  170 QRS Duration:  88 QT Interval:  396 QTC Calculation: 396 R Axis:   32  Text Interpretation: Normal sinus rhythm Nonspecific T wave abnormality When compared with ECG of 01-Apr-2022 14:38, Premature ventricular complexes are no longer Present Premature supraventricular complexes are no longer Present Confirmed by Maxine Glenn [phone]) on 11/30/2022 11:21:39 AM    Physical Exam:   VS:  BP 96/64   Pulse 64   Ht 5\' 6"  (1.676 m)   Wt 150 lb (68 kg)   BMI 24.21 kg/m    Wt Readings from Last 3 Encounters:  11/30/22 150 lb (68 kg)  05/25/22 158 lb (71.7 kg)  05/19/22 157 lb (71.2 kg)     GEN: Well nourished, well developed in no acute distress NECK: No JVD; No carotid bruits CARDIAC: Regular rate and rhythm, no murmurs, rubs, gallops RESPIRATORY:  Clear to auscultation without rales, wheezing or rhonchi  ABDOMEN: Soft, non-tender, non-distended EXTREMITIES:  No edema; No deformity   ASSESSMENT AND PLAN:    Frequent PVCs Dr. Lalla Brothers would not recommend repeat ablation due to low burden and multiple morphologies.  Continue mexitil 200 mg  BID EKG today shows NSR without PVCs  Chronic systolic CHF Echo 08/2021 LVEF 47-42%, Grade 1 DD Continue toprol, entresto, and inspra. Labs today with non recently in system.   Follow up with Dr. Lalla Brothers in 12 months  Signed, Graciella Freer, PA-C

## 2022-11-30 NOTE — Patient Instructions (Signed)
Medication Instructions:  Your physician recommends that you continue on your current medications as directed. Please refer to the Current Medication list given to you today.  *If you need a refill on your cardiac medications before your next appointment, please call your pharmacy*  Lab Work: TODAY: BMET If you have labs (blood work) drawn today and your tests are completely normal, you will receive your results only by: MyChart Message (if you have MyChart) OR A paper copy in the mail If you have any lab test that is abnormal or we need to change your treatment, we will call you to review the results.  Testing/Procedures: None ordered today.  Follow-Up: At [facility], you and your health needs are our priority.  As part of our continuing mission to provide you with exceptional heart care, we have created designated Provider Care Teams.  These Care Teams include your primary Cardiologist (physician) and Advanced Practice Providers (APPs -  Physician Assistants and Nurse Practitioners) who all work together to provide you with the care you need, when you need it.  Your next appointment:   1 year(s)  The format for your next appointment:   In Person  Provider:   Steffanie Dunn, MD{

## 2022-12-08 ENCOUNTER — Other Ambulatory Visit (HOSPITAL_COMMUNITY): Payer: Self-pay | Admitting: Family Medicine

## 2022-12-15 ENCOUNTER — Ambulatory Visit (INDEPENDENT_AMBULATORY_CARE_PROVIDER_SITE_OTHER): Payer: 59 | Admitting: Family Medicine

## 2022-12-15 ENCOUNTER — Encounter: Payer: Self-pay | Admitting: Family Medicine

## 2022-12-15 VITALS — BP 106/69 | HR 57 | Temp 98.0°F | Ht 66.0 in | Wt 152.0 lb

## 2022-12-15 DIAGNOSIS — E1159 Type 2 diabetes mellitus with other circulatory complications: Secondary | ICD-10-CM

## 2022-12-15 DIAGNOSIS — R7989 Other specified abnormal findings of blood chemistry: Secondary | ICD-10-CM

## 2022-12-15 DIAGNOSIS — Z7984 Long term (current) use of oral hypoglycemic drugs: Secondary | ICD-10-CM

## 2022-12-15 DIAGNOSIS — Z0000 Encounter for general adult medical examination without abnormal findings: Secondary | ICD-10-CM

## 2022-12-15 DIAGNOSIS — E1169 Type 2 diabetes mellitus with other specified complication: Secondary | ICD-10-CM | POA: Diagnosis not present

## 2022-12-15 DIAGNOSIS — I5022 Chronic systolic (congestive) heart failure: Secondary | ICD-10-CM

## 2022-12-15 DIAGNOSIS — I152 Hypertension secondary to endocrine disorders: Secondary | ICD-10-CM | POA: Diagnosis not present

## 2022-12-15 LAB — MICROALBUMIN / CREATININE URINE RATIO
Creatinine,U: 140.8 mg/dL
Microalb Creat Ratio: 0.5 mg/g (ref 0.0–30.0)
Microalb, Ur: 0.7 mg/dL (ref 0.0–1.9)

## 2022-12-15 NOTE — Assessment & Plan Note (Signed)
This is a volume overload.  Euvolemic today.  Continue management per cardiology.

## 2022-12-15 NOTE — Assessment & Plan Note (Signed)
Blood pressure at goal today on current regimen per cardiology with Entresto 49-51 twice daily, eplerenone 25 mg daily, metoprolol succinate 75 mg nightly.

## 2022-12-15 NOTE — Progress Notes (Signed)
Chief Complaint:  Savannah Morales is a 57 y.o. female who presents today for her annual comprehensive physical exam.    Assessment/Plan:  New/Acute Problems: Finger paresthesias No red flags.  Likely mild carpal tunnel syndrome.  We did discuss referral to neurology or sports medicine however she deferred.  We discussed home exercises and handout was given.  She will let us know if not improving or if symptoms worsen.  Chronic Problems Addressed Today: Hypertension associated with diabetes (HCC) Blood pressure at goal today on current regimen per cardiology with Entresto 49-51 twice daily, eplerenone 25 mg daily, metoprolol succinate 75 mg nightly.  Type 2 diabetes mellitus (HCC) Check A1c.  On metformin 500 mg daily.  Low TSH level Check TSH.  CHF (congestive heart failure) (HCC) This is a volume overload.  Euvolemic today.  Continue management per cardiology.  Preventative Healthcare: Check labs.  Up-to-date on colon cancer screening.  Will be following with gynecology soon for Pap and mammogram.  Patient Counseling(The following topics were reviewed and/or handout was given):  -Nutrition: Stressed importance of moderation in sodium/caffeine intake, saturated fat and cholesterol, caloric balance, sufficient intake of fresh fruits, vegetables, and fiber.  -Stressed the importance of regular exercise.   -Substance Abuse: Discussed cessation/primary prevention of tobacco, alcohol, or other drug use; driving or other dangerous activities under the influence; availability of treatment for abuse.   -Injury prevention: Discussed safety belts, safety helmets, smoke detector, smoking near bedding or upholstery.   -Sexuality: Discussed sexually transmitted diseases, partner selection, use of condoms, avoidance of unintended pregnancy and contraceptive alternatives.   -Dental health: Discussed importance of regular tooth brushing, flossing, and dental visits.  -Health maintenance and  immunizations reviewed. Please refer to Health maintenance section.  Return to care in 1 year for next preventative visit.     Subjective:  HPI:  See assessment / plan for status of chronic conditions.   She sometimes gets a burning sensation in her left middle finger. This has been going on for months.  Comes and goes.  No obvious injuries precipitating events.  He was diagnosed with carpal tunnel several years ago while in discharge.   Lifestyle Diet: Balanced. Plenty of fruits and vegetables.  Exercise: Daily working on core exercises.      12/15/2022    2:11 PM  Depression screen PHQ 2/9  Decreased Interest 0  Down, Depressed, Hopeless 0  PHQ - 2 Score 0    Health Maintenance Due  Topic Date Due   FOOT EXAM  11/10/2019   Diabetic kidney evaluation - Urine ACR  11/12/2020   OPHTHALMOLOGY EXAM  10/10/2022   HEMOGLOBIN A1C  11/17/2022     ROS:Per HPI, otherwise a complete review of systems was negative.   PMH:  The following were reviewed and entered/updated in epic: Past Medical History:  Diagnosis Date   CHF (congestive heart failure) (HCC)    Diabetes mellitus without complication (HCC)    Type II   History of chicken pox    Hypertension    Kidney stones    Patient Active Problem List   Diagnosis Date Noted   PVC's (premature ventricular contractions) 02/12/2020   Low TSH level 12/25/2019   CHF (congestive heart failure) (HCC) 12/24/2019   Type 2 diabetes mellitus (HCC) 04/21/2016   Hypertension associated with diabetes (HCC) 03/17/2016   Past Surgical History:  Procedure Laterality Date   ANKLE SURGERY     PVC ABLATION N/A 01/31/2020   Procedure: PVC ABLATION;  Surgeon:  Lanier Prude, MD;  Location: [facility];  Service: Cardiovascular;  Laterality: N/A;   RIGHT/LEFT HEART CATH AND CORONARY ANGIOGRAPHY N/A 02/27/2020   Procedure: RIGHT/LEFT HEART CATH AND CORONARY ANGIOGRAPHY;  Surgeon: Dolores Patty, MD;  Location: [facility];   Service: Cardiovascular;  Laterality: N/A;    Family History  Problem Relation Age of Onset   Hyperlipidemia Mother    Hypertension Father    Diabetes Father    Cancer Paternal Grandmother     Medications- reviewed and updated Current Outpatient Medications  Medication Sig Dispense Refill   benzoyl peroxide-erythromycin (BENZAMYCIN) gel Apply 1 application topically daily.      BLACK CURRANT SEED OIL PO Take 1 tablet by mouth daily.     Cats Claw, Uncaria Tomentosa, (CATS CLAW PO) Take by mouth every other day.     eplerenone (INSPRA) 25 MG tablet Take 1 tablet (25 mg total) by mouth daily. 90 tablet 1   furosemide (LASIX) 40 MG tablet Take 1 tablet (40 mg total) by mouth every other day. NEEDS FOLLOW UP APPOINTMENT FOR MORE REFILLS 90 tablet 1   metFORMIN (GLUCOPHAGE-XR) 500 MG 24 hr tablet TAKE 1 TABLET(500 MG) BY MOUTH DAILY WITH BREAKFAST 60 tablet 2   metoprolol succinate (TOPROL-XL) 50 MG 24 hr tablet TAKE ONE AND ONE HALF TABLETS BY MOUTH AT BEDTIME WITH OR IMMEDIATELY FOLLOWING A MEAL 90 tablet 3   mexiletine (MEXITIL) 200 MG capsule Take 1 capsule (200 mg total) by mouth 2 (two) times daily. NEEDS FOLLOW UP APPOINTMENT FOR MORE REFILLS 180 capsule 0   Potassium Chloride ER 20 MEQ TBCR TAKE 1 TABLET BY MOUTH EVERY OTHER DAY 90 tablet 3   sacubitril-valsartan (ENTRESTO) 49-51 MG TAKE 1 TABLET BY MOUTH TWICE DAILY 60 tablet 2   No current facility-administered medications for this visit.    Allergies-reviewed and updated No Known Allergies  Social History   Socioeconomic History   Marital status: Divorced    Spouse name: Not on file   Number of children: 3   Years of education: 14   Highest education level: Not on file  Occupational History   Occupation: Actor   Tobacco Use   Smoking status: Never   Smokeless tobacco: Never  Vaping Use   Vaping status: Never Used  Substance and Sexual Activity   Alcohol use: Not Currently    Comment:  Rarely   Drug use: No   Sexual activity: Yes  Other Topics Concern   Not on file  Social History Narrative   Not on file   Social Determinants of Health   Financial Resource Strain: Not on file  Food Insecurity: No Food Insecurity (01/11/2020)   Hunger Vital Sign    Worried About Running Out of Food in the Last Year: Never true    Ran Out of Food in the Last Year: Never true  Transportation Needs: No Transportation Needs (01/11/2020)   PRAPARE - Administrator, Civil Service (Medical): No    Lack of Transportation (Non-Medical): No  Physical Activity: Not on file  Stress: Not on file  Social Connections: Unknown (09/09/2021)   [facility]   Social Network    Social Network: Not on file        Objective:  Physical Exam: BP 106/69   Pulse (!) 57   Temp 98 F (36.7 C) (Temporal)   Ht 5\' 6"  (1.676 m)   Wt 152 lb (68.9  kg)   SpO2 100%   BMI 24.53 kg/m   Body mass index is 24.53 kg/m. Wt Readings from Last 3 Encounters:  12/15/22 152 lb (68.9 kg)  11/30/22 150 lb (68 kg)  05/25/22 158 lb (71.7 kg)   Gen: NAD, resting comfortably HEENT: TMs normal bilaterally. OP clear. No thyromegaly noted.  CV: RRR with no murmurs appreciated Pulm: NWOB, CTAB with no crackles, wheezes, or rhonchi GI: Normal bowel sounds present. Soft, Nontender, Nondistended. MSK: no edema, cyanosis, or clubbing noted.  Left hand without deformities.  Negative Tinel sign and Phalen test.  Neurovascular intact distally. Skin: warm, dry Neuro: CN2-12 grossly intact. Strength 5/5 in upper and lower extremities. Reflexes symmetric and intact bilaterally.  Psych: Normal affect and thought content     Romey Cohea M. [name], MD 12/15/2022 2:41 PM

## 2022-12-15 NOTE — Patient Instructions (Addendum)
It was very nice to see you today!  We will check blood work.  Please continue to work on diet and exercise.  You probably have a bit of carpal tunnel in your hand.  Please work on the exercises and let us know if not improving.  Return in about 6 months (around 06/17/2023) for Follow Up.   Take care, Dr [name]  PLEASE NOTE:  If you had any lab tests, please let us know if you have not heard back within a few days. You may see your results on mychart before we have a chance to review them but we will give you a call once they are reviewed by [place].   If we ordered any referrals today, please let us know if you have not heard from their office within the next week.   If you had any urgent prescriptions sent in today, please check with the pharmacy within an hour of our visit to make sure the prescription was transmitted appropriately.   Please try these tips to maintain a healthy lifestyle:  Eat at least 3 REAL meals and 1-2 snacks per day.  Aim for no more than 5 hours between eating.  If you eat breakfast, please do so within one hour of getting up.   Each meal should contain half fruits/vegetables, one quarter protein, and one quarter carbs (no bigger than a computer mouse)  Cut down on sweet beverages. This includes juice, soda, and sweet tea.   Drink at least 1 glass of water with each meal and aim for at least 8 glasses per day  Exercise at least 150 minutes every week.    Preventive Care 71-80 Years Old, Female Preventive care refers to lifestyle choices and visits with your health care provider that can promote health and wellness. Preventive care visits are also called wellness exams. What can I expect for my preventive care visit? Counseling Your health care provider may ask you questions about your: Medical history, including: Past medical problems. Family medical history. Pregnancy history. Current health, including: Menstrual cycle. Method of birth  control. Emotional well-being. Home life and relationship well-being. Sexual activity and sexual health. Lifestyle, including: Alcohol, nicotine or tobacco, and drug use. Access to firearms. Diet, exercise, and sleep habits. Work and work Astronomer. Sunscreen use. Safety issues such as seatbelt and bike helmet use. Physical exam Your health care provider will check your: Height and weight. These may be used to calculate your BMI (body mass index). BMI is a measurement that tells if you are at a healthy weight. Waist circumference. This measures the distance around your waistline. This measurement also tells if you are at a healthy weight and may help predict your risk of certain diseases, such as type 2 diabetes and high blood pressure. Heart rate and blood pressure. Body temperature. Skin for abnormal spots. What immunizations do I need?  Vaccines are usually given at various ages, according to a schedule. Your health care provider will recommend vaccines for you based on your age, medical history, and lifestyle or other factors, such as travel or where you work. What tests do I need? Screening Your health care provider may recommend screening tests for certain conditions. This may include: Lipid and cholesterol levels. Diabetes screening. This is done by checking your blood sugar (glucose) after you have not eaten for a while (fasting). Pelvic exam and Pap test. Hepatitis B test. Hepatitis C test. HIV (human immunodeficiency virus) test. STI (sexually transmitted infection) testing, if you are at  risk. Lung cancer screening. Colorectal cancer screening. Mammogram. Talk with your health care provider about when you should start having regular mammograms. This may depend on whether you have a family history of breast cancer. BRCA-related cancer screening. This may be done if you have a family history of breast, ovarian, tubal, or peritoneal cancers. Bone density scan. This is  done to screen for osteoporosis. Talk with your health care provider about your test results, treatment options, and if necessary, the need for more tests. Follow these instructions at home: Eating and drinking  Eat a diet that includes fresh fruits and vegetables, whole grains, lean protein, and low-fat dairy products. Take vitamin and mineral supplements as recommended by your health care provider. Do not drink alcohol if: Your health care provider tells you not to drink. You are pregnant, may be pregnant, or are planning to become pregnant. If you drink alcohol: Limit how much you have to 0-1 drink a day. Know how much alcohol is in your drink. In the [place] one drink equals one 12 oz bottle of beer (355 mL), one 5 oz glass of wine (148 mL), or one 1 oz glass of hard liquor (44 mL). Lifestyle Brush your teeth every morning and night with fluoride toothpaste. Floss one time each day. Exercise for at least 30 minutes 5 or more days each week. Do not use any products that contain nicotine or tobacco. These products include cigarettes, chewing tobacco, and vaping devices, such as e-cigarettes. If you need help quitting, ask your health care provider. Do not use drugs. If you are sexually active, practice safe sex. Use a condom or other form of protection to prevent STIs. If you do not wish to become pregnant, use a form of birth control. If you plan to become pregnant, see your health care provider for a prepregnancy visit. Take aspirin only as told by your health care provider. Make sure that you understand how much to take and what form to take. Work with your health care provider to find out whether it is safe and beneficial for you to take aspirin daily. Find healthy ways to manage stress, such as: Meditation, yoga, or listening to music. Journaling. Talking to a trusted person. Spending time with friends and family. Minimize exposure to UV radiation to reduce your risk of skin  cancer. Safety Always wear your seat belt while driving or riding in a vehicle. Do not drive: If you have been drinking alcohol. Do not ride with someone who has been drinking. When you are tired or distracted. While texting. If you have been using any mind-altering substances or drugs. Wear a helmet and other protective equipment during sports activities. If you have firearms in your house, make sure you follow all gun safety procedures. Seek help if you have been physically or sexually abused. What's next? Visit your health care provider once a year for an annual wellness visit. Ask your health care provider how often you should have your eyes and teeth checked. Stay up to date on all vaccines. This information is not intended to replace advice given to you by your health care provider. Make sure you discuss any questions you have with your health care provider. Document Revised: 10/09/2020 Document Reviewed: 10/09/2020 Elsevier Patient Education  2024 [employer].

## 2022-12-15 NOTE — Assessment & Plan Note (Signed)
Check A1c.  On metformin 500 mg daily. 

## 2022-12-15 NOTE — Assessment & Plan Note (Signed)
Check TSH 

## 2022-12-16 LAB — COMPREHENSIVE METABOLIC PANEL
ALT: 11 U/L (ref 0–35)
AST: 14 U/L (ref 0–37)
Albumin: 4.3 g/dL (ref 3.5–5.2)
Alkaline Phosphatase: 59 U/L (ref 39–117)
BUN: 13 mg/dL (ref 6–23)
CO2: 27 meq/L (ref 19–32)
Calcium: 9.2 mg/dL (ref 8.4–10.5)
Chloride: 106 meq/L (ref 96–112)
Creatinine, Ser: 0.76 mg/dL (ref 0.40–1.20)
GFR: 88.39 mL/min (ref >60.00)
Glucose, Bld: 75 mg/dL (ref 70–99)
Potassium: 4.2 meq/L (ref 3.5–5.1)
Sodium: 141 meq/L (ref 135–145)
Total Bilirubin: 0.3 mg/dL (ref 0.2–1.2)
Total Protein: 7.2 g/dL (ref 6.0–8.3)

## 2022-12-16 LAB — LIPID PANEL
Cholesterol: 175 mg/dL (ref 0–200)
HDL: 70.7 mg/dL (ref >39.00)
LDL Cholesterol: 91 mg/dL (ref 0–99)
NonHDL: 104.28
Total CHOL/HDL Ratio: 2
Triglycerides: 68 mg/dL (ref 0.0–149.0)
VLDL: 13.6 mg/dL (ref 0.0–40.0)

## 2022-12-16 LAB — CBC
HCT: 38.9 % (ref 36.0–46.0)
Hemoglobin: 12.5 g/dL (ref 12.0–15.0)
MCHC: 32 g/dL (ref 30.0–36.0)
MCV: 85.3 fl (ref 78.0–100.0)
Platelets: 198 10*3/uL (ref 150.0–400.0)
RBC: 4.57 Mil/uL (ref 3.87–5.11)
RDW: 15 % (ref 11.5–15.5)
WBC: 3.9 10*3/uL — ABNORMAL LOW (ref 4.0–10.5)

## 2022-12-16 LAB — HEMOGLOBIN A1C: Hgb A1c MFr Bld: 6 % (ref 4.6–6.5)

## 2022-12-18 LAB — TSH: TSH: 0.47 u[IU]/mL (ref 0.35–5.50)

## 2022-12-22 NOTE — Progress Notes (Signed)
Her blood sugar is borderline elevated but stable compared to previous.  The rest of her labs are all at goal.  Do not need to make any changes to treatment plan.  She should continue to work on diet and exercise and we can recheck everything in a year or so.

## 2023-01-05 ENCOUNTER — Other Ambulatory Visit: Payer: Self-pay | Admitting: Family Medicine

## 2023-01-05 DIAGNOSIS — E119 Type 2 diabetes mellitus without complications: Secondary | ICD-10-CM

## 2023-01-27 ENCOUNTER — Telehealth (HOSPITAL_COMMUNITY): Payer: Self-pay

## 2023-01-27 ENCOUNTER — Other Ambulatory Visit (HOSPITAL_COMMUNITY): Payer: Self-pay

## 2023-01-27 MED ORDER — MEXILETINE HCL 200 MG PO CAPS: 200.0000 mg | ORAL_CAPSULE | Freq: Two times a day (BID) | ORAL | 0 refills | Status: DC

## 2023-01-27 NOTE — Telephone Encounter (Signed)
Advanced Heart Failure Patient Advocate Encounter  Patient called, having trouble filling mexiletine. Attempted to return pt call, no answer. Left voicemail.  Burnell Blanks, CPhT Rx Patient Advocate Phone: [phone]

## 2023-01-27 NOTE — Telephone Encounter (Signed)
Spoke to patient and pharmacy by phone, they did not have enough for this fill. Pt will go by pharmacy to pick up a temporary supply until inventory arrives. Nothing further needed at this time.

## 2023-02-02 ENCOUNTER — Other Ambulatory Visit: Payer: Self-pay | Admitting: Internal Medicine

## 2023-02-19 ENCOUNTER — Encounter (HOSPITAL_COMMUNITY): Payer: Self-pay | Admitting: *Deleted

## 2023-02-19 NOTE — Progress Notes (Signed)
Forms for intermittent FMLA completed and signed by Dr Gala Romney, forms emailed to pt per her request

## 2023-02-23 ENCOUNTER — Encounter: Payer: Self-pay | Admitting: Family Medicine

## 2023-02-23 NOTE — Telephone Encounter (Signed)
We do not have those here. She can buy them at the pharmacy or drug store. Recommend referral to sports medicine if needed.  Katina Degree. [name], MD 02/23/2023 3:09 PM

## 2023-02-23 NOTE — Telephone Encounter (Signed)
Please advise 

## 2023-02-24 ENCOUNTER — Ambulatory Visit: Payer: 59 | Admitting: Family Medicine

## 2023-02-24 ENCOUNTER — Encounter: Payer: Self-pay | Admitting: Family Medicine

## 2023-02-24 VITALS — BP 104/63 | HR 45 | Temp 98.0°F | Ht 66.0 in | Wt 152.0 lb

## 2023-02-24 DIAGNOSIS — E119 Type 2 diabetes mellitus without complications: Secondary | ICD-10-CM

## 2023-02-24 DIAGNOSIS — I152 Hypertension secondary to endocrine disorders: Secondary | ICD-10-CM | POA: Diagnosis not present

## 2023-02-24 DIAGNOSIS — M25532 Pain in left wrist: Secondary | ICD-10-CM

## 2023-02-24 DIAGNOSIS — Z7984 Long term (current) use of oral hypoglycemic drugs: Secondary | ICD-10-CM | POA: Diagnosis not present

## 2023-02-24 DIAGNOSIS — E1159 Type 2 diabetes mellitus with other circulatory complications: Secondary | ICD-10-CM

## 2023-02-24 MED ORDER — WRIST SPLINT/COCK-UP/LEFT SM MISC: 0 refills | Status: AC

## 2023-02-24 MED ORDER — PREDNISONE 20 MG PO TABS: 20.0000 mg | ORAL_TABLET | Freq: Every day | ORAL | 0 refills | Status: DC

## 2023-02-24 NOTE — Assessment & Plan Note (Signed)
Blood pressure at goal today on Entresto 49-51 twice daily, eplerenone 25 mg daily, and metoprolol succinate 75 mg daily.

## 2023-02-24 NOTE — Patient Instructions (Signed)
It was very nice to see you today!  Please start the prednisone.  Work on the exercises.  Use the splint is much as you can especially at night.  Return if symptoms worsen or fail to improve.   Take care, Dr [name]  PLEASE NOTE:  If you had any lab tests, please let us know if you have not heard back within a few days. You may see your results on mychart before we have a chance to review them but we will give you a call once they are reviewed by [place].   If we ordered any referrals today, please let us know if you have not heard from their office within the next week.   If you had any urgent prescriptions sent in today, please check with the pharmacy within an hour of our visit to make sure the prescription was transmitted appropriately.   Please try these tips to maintain a healthy lifestyle:  Eat at least 3 REAL meals and 1-2 snacks per day.  Aim for no more than 5 hours between eating.  If you eat breakfast, please do so within one hour of getting up.   Each meal should contain half fruits/vegetables, one quarter protein, and one quarter carbs (no bigger than a computer mouse)  Cut down on sweet beverages. This includes juice, soda, and sweet tea.   Drink at least 1 glass of water with each meal and aim for at least 8 glasses per day  Exercise at least 150 minutes every week.

## 2023-02-24 NOTE — Progress Notes (Signed)
   Savannah Morales is a 57 y.o. female who presents today for an office visit.  Assessment/Plan:  New/Acute Problems: Left Wrist Pain History and exam consistent with carpal tunnel syndrome.  She does have bony hypertrophy noted and likely has some underlying osteoarthritis as well.  She is working on home exercise program and will continue doing this.  We will start cock up wrist splint as well.  Will give low-dose prednisone burst.  Need to avoid NSAIDs due to her cardiac history.  She will follow-up with [place] in 1 to 2 weeks.  Will refer to sports medicine if not improving.  Chronic Problems Addressed Today: Hypertension associated with diabetes (HCC) Blood pressure at goal today on Entresto 49-51 twice daily, eplerenone 25 mg daily, and metoprolol succinate 75 mg daily.  Type 2 diabetes mellitus (HCC) Last A1c at goal at 6.0.  Do not dissipate any issues with low-dose prednisone burst.  Will check A1c next visit.  She will continue metformin 500 mg daily.     Subjective:  HPI:  See A/P for status of chronic conditions.  Patient is here today with left wrist pain.  We discussed this at her annual physical a couple of months ago.  Diagnosed with mild carpal tunnel syndrome and we started her on home exercise program.  Symptoms have progressed.  Still having paresthesias and help pain radiating into her wrist and fingers.  She would like to try a splint if possible.  She has had this in the past and has done well with it.       Objective:  Physical Exam: BP 104/63   Pulse (!) 45   Temp 98 F (36.7 C) (Temporal)   Ht 5\' 6"  (1.676 m)   Wt 152 lb (68.9 kg)   SpO2 100%   BMI 24.53 kg/m   Gen: No acute distress, resting comfortably MUSCULOSKELETAL: - Left hand:Bony hypertrophy noted but otherwise deformities.  Strength 5 out of 5 throughout.  Positive Tinel sign.  Neurovascular intact distally.    Neuro: Grossly normal, moves all extremities Psych: Normal affect and thought  content      Markey Deady M. [name], MD 02/24/2023 9:56 AM

## 2023-02-24 NOTE — Assessment & Plan Note (Signed)
Last A1c at goal at 6.0.  Do not dissipate any issues with low-dose prednisone burst.  Will check A1c next visit.  She will continue metformin 500 mg daily.

## 2023-02-25 ENCOUNTER — Telehealth (HOSPITAL_COMMUNITY): Payer: Self-pay | Admitting: Cardiology

## 2023-02-25 NOTE — Telephone Encounter (Signed)
Patient was seen. Rx was written

## 2023-02-25 NOTE — Telephone Encounter (Signed)
Pt called to report herf mla was denied as a question was incorrect on form  Confirmed with employer [phone] Question 1,part a Should only be completed for the duration of the fmla leave despite question pertaining to condition Requests update on letterhead

## 2023-03-05 ENCOUNTER — Encounter (HOSPITAL_COMMUNITY): Payer: Self-pay | Admitting: *Deleted

## 2023-03-05 NOTE — Telephone Encounter (Signed)
Letter completed with updated info and emailed to patient

## 2023-03-08 ENCOUNTER — Other Ambulatory Visit (HOSPITAL_COMMUNITY): Payer: Self-pay | Admitting: Internal Medicine

## 2023-04-02 ENCOUNTER — Other Ambulatory Visit (HOSPITAL_COMMUNITY): Payer: Self-pay

## 2023-04-02 DIAGNOSIS — I5022 Chronic systolic (congestive) heart failure: Secondary | ICD-10-CM

## 2023-04-02 MED ORDER — POTASSIUM CHLORIDE ER 20 MEQ PO TBCR
1.0000 | EXTENDED_RELEASE_TABLET | ORAL | 0 refills | Status: DC
Start: 2023-04-02 — End: 2023-06-28

## 2023-04-05 ENCOUNTER — Other Ambulatory Visit: Payer: Self-pay | Admitting: Internal Medicine

## 2023-04-22 ENCOUNTER — Other Ambulatory Visit (HOSPITAL_COMMUNITY): Payer: Self-pay | Admitting: Internal Medicine

## 2023-04-27 MED ORDER — MEXILETINE HCL 200 MG PO CAPS: 200.0000 mg | ORAL_CAPSULE | Freq: Two times a day (BID) | ORAL | 0 refills | Status: DC

## 2023-05-16 ENCOUNTER — Other Ambulatory Visit (HOSPITAL_COMMUNITY): Payer: Self-pay | Admitting: Internal Medicine

## 2023-05-22 ENCOUNTER — Other Ambulatory Visit (HOSPITAL_COMMUNITY): Payer: Self-pay | Admitting: Internal Medicine

## 2023-06-17 ENCOUNTER — Ambulatory Visit: Payer: Self-pay | Admitting: Family Medicine

## 2023-06-22 ENCOUNTER — Other Ambulatory Visit (HOSPITAL_COMMUNITY): Payer: Self-pay | Admitting: Internal Medicine

## 2023-06-22 ENCOUNTER — Ambulatory Visit: Payer: BC Managed Care – PPO | Admitting: Family Medicine

## 2023-06-22 ENCOUNTER — Encounter: Payer: Self-pay | Admitting: Family Medicine

## 2023-06-22 VITALS — BP 108/70 | HR 76 | Temp 97.2°F | Ht 66.0 in | Wt 154.2 lb

## 2023-06-22 DIAGNOSIS — I152 Hypertension secondary to endocrine disorders: Secondary | ICD-10-CM | POA: Diagnosis not present

## 2023-06-22 DIAGNOSIS — E1159 Type 2 diabetes mellitus with other circulatory complications: Secondary | ICD-10-CM

## 2023-06-22 DIAGNOSIS — Z7984 Long term (current) use of oral hypoglycemic drugs: Secondary | ICD-10-CM | POA: Diagnosis not present

## 2023-06-22 DIAGNOSIS — E119 Type 2 diabetes mellitus without complications: Secondary | ICD-10-CM

## 2023-06-22 DIAGNOSIS — Z23 Encounter for immunization: Secondary | ICD-10-CM

## 2023-06-22 LAB — MICROALBUMIN / CREATININE URINE RATIO
Creatinine,U: 173.7 mg/dL
Microalb Creat Ratio: 5.8 mg/g (ref 0.0–30.0)
Microalb, Ur: 1 mg/dL (ref 0.0–1.9)

## 2023-06-22 LAB — POCT GLYCOSYLATED HEMOGLOBIN (HGB A1C): Hemoglobin A1C: 6 % — AB (ref 4.0–5.6)

## 2023-06-22 NOTE — Assessment & Plan Note (Signed)
 A1c at goal at 6.0.  Continue metformin 500 mg daily.  Recheck in 6 months.

## 2023-06-22 NOTE — Patient Instructions (Signed)
 It was very nice to see you today!  Your sugar looks great today.  We gave you your pneumonia vaccine.  Will check a urine sample.  Will see back in 6 months for your annual physical.  Come back sooner if needed.  Return in about 6 months (around 12/20/2023) for Annual Physical.   Take care, Dr [name]  PLEASE NOTE:  If you had any lab tests, please let us know if you have not heard back within a few days. You may see your results on mychart before we have a chance to review them but we will give you a call once they are reviewed by [place].   If we ordered any referrals today, please let us know if you have not heard from their office within the next week.   If you had any urgent prescriptions sent in today, please check with the pharmacy within an hour of our visit to make sure the prescription was transmitted appropriately.   Please try these tips to maintain a healthy lifestyle:  Eat at least 3 REAL meals and 1-2 snacks per day.  Aim for no more than 5 hours between eating.  If you eat breakfast, please do so within one hour of getting up.   Each meal should contain half fruits/vegetables, one quarter protein, and one quarter carbs (no bigger than a computer mouse)  Cut down on sweet beverages. This includes juice, soda, and sweet tea.   Drink at least 1 glass of water with each meal and aim for at least 8 glasses per day  Exercise at least 150 minutes every week.

## 2023-06-22 NOTE — Progress Notes (Signed)
   Savannah Morales is a 58 y.o. female who presents today for an office visit.  Assessment/Plan:  Chronic Problems Addressed Today: Type 2 diabetes mellitus (HCC) A1c at goal at 6.0.  Continue metformin 500 mg daily.  Recheck in 6 months.  Hypertension associated with diabetes (HCC) Blood pressure at goal today on Entresto 49-51 twice daily, eplerenone 25 mg daily, metoprolol succinate 75 mg daily.  Preventive health care Pneumonia vaccine given today.  She will come back soon for CPE. Due for cologuard later this year.    Subjective:  HPI:  See Assessment / plan for status of chronic conditions. Patient here today for 6 month follow up. She has no acute concerns today.        Objective:  Physical Exam: BP 108/70   Pulse 76   Temp (!) 97.2 F (36.2 C) (Temporal)   Ht 5\' 6"  (1.676 m)   Wt 154 lb 3.2 oz (69.9 kg)   SpO2 99%   BMI 24.89 kg/m   Gen: No acute distress, resting comfortably CV: Regular rate and rhythm with no murmurs appreciated Pulm: Normal work of breathing, clear to auscultation bilaterally with no crackles, wheezes, or rhonchi Neuro: Grossly normal, moves all extremities Psych: Normal affect and thought content      Estephany Perot M. [name], MD 06/22/2023 2:41 PM

## 2023-06-22 NOTE — Assessment & Plan Note (Signed)
 Blood pressure at goal today on Entresto 49-51 twice daily, eplerenone 25 mg daily, metoprolol succinate 75 mg daily.

## 2023-06-23 ENCOUNTER — Encounter: Payer: Self-pay | Admitting: Family Medicine

## 2023-06-23 NOTE — Progress Notes (Signed)
 Urine test is normal.  We can recheck again in a year.

## 2023-06-26 ENCOUNTER — Other Ambulatory Visit (HOSPITAL_COMMUNITY): Payer: Self-pay | Admitting: Internal Medicine

## 2023-06-26 DIAGNOSIS — I5022 Chronic systolic (congestive) heart failure: Secondary | ICD-10-CM

## 2023-06-29 ENCOUNTER — Other Ambulatory Visit: Payer: Self-pay | Admitting: Family Medicine

## 2023-06-29 DIAGNOSIS — E119 Type 2 diabetes mellitus without complications: Secondary | ICD-10-CM

## 2023-07-01 ENCOUNTER — Other Ambulatory Visit: Payer: Self-pay | Admitting: Internal Medicine

## 2023-07-12 ENCOUNTER — Other Ambulatory Visit (HOSPITAL_COMMUNITY): Payer: Self-pay | Admitting: Internal Medicine

## 2023-07-14 ENCOUNTER — Other Ambulatory Visit (HOSPITAL_COMMUNITY): Payer: Self-pay | Admitting: Internal Medicine

## 2023-07-18 ENCOUNTER — Other Ambulatory Visit (HOSPITAL_COMMUNITY): Payer: Self-pay | Admitting: Internal Medicine

## 2023-07-22 ENCOUNTER — Other Ambulatory Visit (HOSPITAL_COMMUNITY): Payer: Self-pay | Admitting: Internal Medicine

## 2023-08-24 ENCOUNTER — Other Ambulatory Visit (HOSPITAL_COMMUNITY): Payer: Self-pay | Admitting: Cardiology

## 2023-08-24 DIAGNOSIS — I5022 Chronic systolic (congestive) heart failure: Secondary | ICD-10-CM

## 2023-08-24 MED ORDER — POTASSIUM CHLORIDE ER 20 MEQ PO TBCR
20.0000 meq | EXTENDED_RELEASE_TABLET | Freq: Every day | ORAL | 6 refills | Status: AC
Start: 2023-08-24 — End: ?

## 2023-08-30 ENCOUNTER — Other Ambulatory Visit: Payer: Self-pay | Admitting: Internal Medicine

## 2023-09-15 ENCOUNTER — Other Ambulatory Visit (HOSPITAL_COMMUNITY): Payer: Self-pay | Admitting: Internal Medicine

## 2023-09-23 ENCOUNTER — Other Ambulatory Visit

## 2023-09-23 ENCOUNTER — Ambulatory Visit (HOSPITAL_COMMUNITY)
Admission: RE | Admit: 2023-09-23 | Discharge: 2023-09-23 | Disposition: A | Discharge status: Home / Self Care | Source: Ambulatory Visit | Attending: Internal Medicine | Admitting: Internal Medicine

## 2023-09-23 ENCOUNTER — Encounter (HOSPITAL_COMMUNITY): Payer: Self-pay | Admitting: Internal Medicine

## 2023-09-23 VITALS — BP 106/74 | HR 72 | Ht 68.0 in | Wt 156.0 lb

## 2023-09-23 DIAGNOSIS — Z006 Encounter for examination for normal comparison and control in clinical research program: Secondary | ICD-10-CM

## 2023-09-23 DIAGNOSIS — I110 Hypertensive heart disease with heart failure: Secondary | ICD-10-CM | POA: Diagnosis not present

## 2023-09-23 DIAGNOSIS — I472 Ventricular tachycardia, unspecified: Secondary | ICD-10-CM | POA: Diagnosis not present

## 2023-09-23 DIAGNOSIS — I493 Ventricular premature depolarization: Secondary | ICD-10-CM | POA: Diagnosis not present

## 2023-09-23 DIAGNOSIS — E119 Type 2 diabetes mellitus without complications: Secondary | ICD-10-CM | POA: Diagnosis not present

## 2023-09-23 DIAGNOSIS — I340 Nonrheumatic mitral (valve) insufficiency: Secondary | ICD-10-CM | POA: Diagnosis not present

## 2023-09-23 DIAGNOSIS — I10 Essential (primary) hypertension: Secondary | ICD-10-CM | POA: Diagnosis not present

## 2023-09-23 DIAGNOSIS — Z79899 Other long term (current) drug therapy: Secondary | ICD-10-CM | POA: Diagnosis not present

## 2023-09-23 DIAGNOSIS — I5042 Chronic combined systolic (congestive) and diastolic (congestive) heart failure: Secondary | ICD-10-CM | POA: Diagnosis not present

## 2023-09-23 DIAGNOSIS — I428 Other cardiomyopathies: Secondary | ICD-10-CM | POA: Diagnosis not present

## 2023-09-23 DIAGNOSIS — I5022 Chronic systolic (congestive) heart failure: Secondary | ICD-10-CM | POA: Diagnosis not present

## 2023-09-23 LAB — BASIC METABOLIC PANEL WITH GFR
Anion gap: 10 (ref 5–15)
BUN: 10 mg/dL (ref 6–20)
CO2: 25 mmol/L (ref 22–32)
Calcium: 9.4 mg/dL (ref 8.9–10.3)
Chloride: 104 mmol/L (ref 98–111)
Creatinine, Ser: 0.63 mg/dL (ref 0.44–1.00)
GFR, Estimated: >60 mL/min (ref >60)
Glucose, Bld: 75 mg/dL (ref 70–99)
Potassium: 4.1 mmol/L (ref 3.5–5.1)
Sodium: 139 mmol/L (ref 135–145)

## 2023-09-23 LAB — BRAIN NATRIURETIC PEPTIDE: B Natriuretic Peptide: 105.5 pg/mL — ABNORMAL HIGH (ref 0.0–100.0)

## 2023-09-23 NOTE — Progress Notes (Signed)
 Advanced Heart Failure Clinic Note        PCP: Rodney Clamp, MD Primary Cardiologist: Dr. Rolm Clos HF Cardiologist: Dr. Julane Ny  EP: Dr. Marven Slimmer   Reason for Visit: F/u for chronic systolic heart failure   HPI: Savannah Morales is a 58 y.o. female with a hx of DM, HTN and systolic HF due to NICM.    Says she was doing fine until she got the COVID vaccine [employer]) in 5/21. Admitted 12/24/19 with worsening SOB and cough. Echo showed EF 35%. Diuresed and GDMT started. No coronary calcium and no CP so cath not felt warranted,    cMRI 9/21 - Severely dilated LV EF 21% with normal wall thickness. Diffuse - RVEF 26%. - Severe left atrial enlargement, moderate right atrial enlargement. - Moderate to severe central mitral regurgitation with regurgitant fraction 48%. 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 Marven Slimmer    R/L cath  02/27/20 EF 20-25%. normal cors with well compensated hemodynamics  Ao = 91/65 (79) LV = 95/5 RA = 2 RV = 35/4 PA = 34/14 (21) PCW = 5 Fick cardiac output/index = 6.2/3.1 Thermo CO/CI =5.2/2.6 PVR =2.6 SVR = 952  FA sat = 97% PA sat = 72%  Zio (11/21) NSR. 8 episodes of NSVT, longest lasting 12.7 seconds at rate of 128bpm. Frequent PVC with burden of 13%.  Zio (12/21) NSR avg 83 bpm. No VT. PVC burden 7.3% with at least 3 morphologies of PVCs.  The most dominant PVC had a 5.8% burden.  Echo 2/22 EF 35-40% RV ok   Echo 12/02/20 EF 40-45%   Echo 5/23 EF 40-45%   Follow up 11/23, repeat Zio placed to quantify PVC burden, which showed 10.5% VE burden.  Today she returns for HF follow up. Working [employer]. Exercises regularly with core exercises and walking. No CP or SOB. No edema, orthopnea or PND. No palpitations. Compliant with meds    ROS: All systems negative except as listed in HPI, PMH and Problem List.  SH:  Social History   Socioeconomic History   Marital status: Divorced    Spouse name: Not  on file   Number of children: 3   Years of education: 14   Highest education level: Not on file  Occupational History   Occupation: Actor   Tobacco Use   Smoking status: Never   Smokeless tobacco: Never  Vaping Use   Vaping status: Never Used  Substance and Sexual Activity   Alcohol use: Not Currently    Comment: Rarely   Drug use: No   Sexual activity: Yes  Other Topics Concern   Not on file  Social History Narrative   Not on file   Social Drivers of Health   Financial Resource Strain: Not on file  Food Insecurity: No Food Insecurity (01/11/2020)   Hunger Vital Sign    Worried About Running Out of Food in the Last Year: Never true    Ran Out of Food in the Last Year: Never true  Transportation Needs: No Transportation Needs (01/11/2020)   PRAPARE - Administrator, Civil Service (Medical): No    Lack of Transportation (Non-Medical): No  Physical Activity: Not on file  Stress: Not on file  Social Connections: Unknown (09/09/2021)   Received from [facility]   Social Network    Social Network: Not on file  Intimate Partner Violence: Unknown (07/31/2021)  Received from [facility]   HITS    Physically Hurt: Not on file    Insult or Talk Down To: Not on file    Threaten Physical Harm: Not on file    Scream or Curse: Not on file    FH:  Family History  Problem Relation Age of Onset   Hyperlipidemia Mother    Hypertension Father    Diabetes Father    Cancer Paternal Grandmother     Past Medical History:  Diagnosis Date   CHF (congestive heart failure) (HCC)    Diabetes mellitus without complication (HCC)    Type II   History of chicken pox    Hypertension    Kidney stones     Current Outpatient Medications  Medication Sig Dispense Refill   benzoyl peroxide-erythromycin (BENZAMYCIN) gel Apply 1 application topically daily.      BLACK CURRANT SEED OIL PO Take 1 tablet by mouth daily.      Elastic Bandages & Supports (WRIST SPLINT/COCK-UP/LEFT SM) MISC Use daily. Please fit to appropriate size if needed 1 each 0   eplerenone  (INSPRA ) 25 MG tablet TAKE ONE TABLET BY MOUTH ONCE A DAY *PLEASE SCHEDULE FOLLOW UP FOR FURTHER REFILLS PER DR* 30 tablet 0   furosemide  (LASIX ) 40 MG tablet Take 1 tablet (40 mg total) by mouth every other day. NEEDS FOLLOW UP APPOINTMENT FOR MORE REFILLS 90 tablet 1   metFORMIN  (GLUCOPHAGE -XR) 500 MG 24 hr tablet TAKE 1 TABLET(500 MG) BY MOUTH DAILY WITH BREAKFAST 90 tablet 1   metoprolol  succinate (TOPROL -XL) 50 MG 24 hr tablet TAKE ONE AND ONE-HALF TABLETS BY MOUTH AT BEDTIME WITH OR IMMEDIATELY FOLLOWING A MEAL, NEEDS FOLLOW UP APPOINTMENT FOR MORE REFILLS 90 tablet 0   mexiletine (MEXITIL) 200 MG capsule Take 1 capsule (200 mg total) by mouth 2 (two) times daily. 180 capsule 0   Potassium Chloride  ER 20 MEQ TBCR Take 1 tablet (20 mEq total) by mouth daily. (Patient taking differently: Take 20 mEq by mouth every other day.) 30 tablet 6   sacubitril-valsartan (ENTRESTO ) 49-51 MG TAKE 1 TABLET BY MOUTH TWICE DAILY 60 tablet 0   No current facility-administered medications for this encounter.   BP 106/74   Pulse 72   Ht 5\' 8"  (1.727 m)   Wt 70.8 kg (156 lb)   SpO2 100%   BMI 23.72 kg/m   Wt Readings from Last 3 Encounters:  09/23/23 70.8 kg (156 lb)  06/22/23 69.9 kg (154 lb 3.2 oz)  02/24/23 68.9 kg (152 lb)   PHYSICAL EXAM: General:  NAD. No resp difficulty HEENT: Normal Neck: Supple. No JVD. Carotids 2+ bilat; no bruits. No lymphadenopathy or thryomegaly appreciated. Cor: PMI nondisplaced. Irregular rate & rhythm. No rubs, gallops or murmurs. Lungs: Clear Abdomen: Soft, nontender, nondistended. No hepatosplenomegaly. No bruits or masses. Good bowel sounds. Extremities: No cyanosis, clubbing, rash, edema Neuro: Alert & oriented x 3, cranial nerves grossly intact. Moves all 4 extremities w/o difficulty. Affect pleasant.  ECG (personally  reviewed): SR with frequent PVCs  ASSESSMENT & PLAN: 1. Chronic combined systolic and diastolic CHF: - Diagnosed 8/21 EF 30-35% - cMRI (9/21): LVEF 21% RV EF 26% mod-sev MR. No LGE - R/LHC (02/27/20): EF 20-25%. normal cors with well compensated hemodynamics - Felt to have PVC CM  - s/p PVC ablation 01/31/20. Zio (11/21) 13% PVC. Zio (12/21) 7.3% PVCs - Echo (2/22) EF 35-40% RV ok - Echo (8/22) EF 40-45% - Echo (5/23) EF 40-45% - Doing  well NYHA I. Volume ok  - Continue lasix  40mg  every other day - Continue Entresto  49/51 bid.  - Continue eplerenone  25 mg daily (off spiro due to gynecomastia) - Continue Toprol  75 mg daily. - Off Jardiance  due to yeast infections - As above, PVC cardiomyopathy seems to be the smoking gun here, but EF not fully improved since ablation. Zio patch (12/21) PVC burden 7.3%, with most dominant PVC had 5.8%. Repeat Zio 11/23 showed PVC burden 10% (see below). - Seen back by Dr Marven Slimmer in 2024 and recommended against repeat ablation due to low burden and multiple morphologies.  Continue mexitil 200 mg BID - Labs today.  2.  Frequent PVCs: - s/p PVC ablation 01/31/20 with Dr. Marven Slimmer - Zio (11/21) NSR. 8 episodes of NSVT, longest lasting 12.7 seconds at rate of 128bpm. Frequent PVC with burden of 13%. - Zio (12/21) NSR avg 83 bpm. No VT. PVC burden 7.3% with at least 3 morphologies of PVCs.  The most dominant PVC had a 5.8% burden. - Zio 11/23 NSR, 18 runs of VT, 2nd AVB present, isolated VE (10.5%). - Has seen Dr. Marven Slimmer and she is hesitant to repeat ablation.  - Continue mexilitene 200 bid - Not interested in home sleep study  -    Jules Oar, MD 09/23/23 2:24 PM

## 2023-09-23 NOTE — Patient Instructions (Signed)
 Great to see you today!!!  Medication Changes:  None, continue current medications  Lab Work:  Labs done today, your results will be available in MyChart, we will contact you for abnormal readings.   Testing/Procedures:  Your physician has requested that you have an echocardiogram. Echocardiography is a painless test that uses sound waves to create images of your heart. It provides your doctor with information about the size and shape of your heart and how well your heart's chambers and valves are working. This procedure takes approximately one hour. There are no restrictions for this procedure. Please do NOT wear cologne, perfume, aftershave, or lotions (deodorant is allowed). Please arrive 15 minutes prior to your appointment time.  Please note: We ask at that you not bring children with you during ultrasound (echo/ vascular) testing. Due to room size and safety concerns, children are not allowed in the ultrasound rooms during exams. Our front office staff cannot provide observation of children in our lobby area while testing is being conducted. An adult accompanying a patient to their appointment will only be allowed in the ultrasound room at the discretion of the ultrasound technician under special circumstances. We apologize for any inconvenience.  Special Instructions // Education:  Do the following things EVERYDAY: Weigh yourself in the morning before breakfast. Write it down and keep it in a log. Take your medicines as prescribed Eat low salt foods--Limit salt (sodium) to 2000 mg per day.  Stay as active as you can everyday Limit all fluids for the day to less than 2 liters   Follow-Up in: 1 year (May 2026), **PLEASE CALL OUR OFFICE IN MARCH TO SCHEDULE THIS APPOINTMENT   At [facility], you and your health needs are our priority. We have a designated team specialized in the treatment of Heart Failure. This Care Team includes your primary Heart Failure  Specialized Cardiologist (physician), Advanced Practice Providers (APPs- Physician Assistants and Nurse Practitioners), and Pharmacist who all work together to provide you with the care you need, when you need it.   You may see any of the following providers on your designated Care Team at your next follow up:  Dr. Jules Oar Dr. Peder Bourdon Dr. Alwin Baars Dr. Judyth Nunnery Nieves Bars, NP Ruddy Corral, [place] [facility] Weaver, [place] Dennise Fitz, NP [place] Lee, NP Luster Salters, PharmD   Please be sure to bring in all your medications bottles to every appointment.   Need to Contact Us :  If you have any questions or concerns before your next appointment please send us  a message through [place] or call our office at [phone].    TO LEAVE A MESSAGE FOR THE NURSE SELECT OPTION 2, PLEASE LEAVE A MESSAGE INCLUDING: YOUR NAME DATE OF BIRTH CALL BACK NUMBER REASON FOR CALL**this is important as we prioritize the call backs  YOU WILL RECEIVE A CALL BACK THE SAME DAY AS LONG AS YOU CALL BEFORE 4:00 PM

## 2023-09-24 ENCOUNTER — Other Ambulatory Visit (HOSPITAL_COMMUNITY): Payer: Self-pay

## 2023-09-24 DIAGNOSIS — I5022 Chronic systolic (congestive) heart failure: Secondary | ICD-10-CM

## 2023-09-24 MED ORDER — FUROSEMIDE 40 MG PO TABS: 40.0000 mg | ORAL_TABLET | ORAL | 1 refills | Status: DC

## 2023-09-24 MED ORDER — FUROSEMIDE 40 MG PO TABS: 40.0000 mg | ORAL_TABLET | ORAL | 1 refills | Status: AC

## 2023-10-04 LAB — GENECONNECT MOLECULAR SCREEN

## 2023-10-05 ENCOUNTER — Telehealth: Payer: Self-pay | Admitting: Medical Genetics

## 2023-10-05 ENCOUNTER — Other Ambulatory Visit: Payer: Self-pay | Admitting: Medical Genetics

## 2023-10-05 DIAGNOSIS — Z006 Encounter for examination for normal comparison and control in clinical research program: Secondary | ICD-10-CM

## 2023-10-05 NOTE — Telephone Encounter (Signed)
 [facility]  10/05/2023  3:07 PM  Confirmed I was speaking with SHANTAY SONN [id number] by using name and DOB. Informed participant the reason for this call is to follow-up on a recent sample the participant provided at one of the [facility] lab locations. Informed participant the test was not able to be completed with this sample and apologized for the inconvenience. Participant was requested to provide a new sample at one of our participating labs at no cost so that participant can continue participation and receive test results. Informed participant they do not need to be fasting and if there are other samples that need to be drawn, they can be done at the same visit. Participant has not had a blood transfusion or blood product in the last 30 days. Participant agreed to provide another sample. Participant was provided the [employer] program website to learn why this may have happened. Participant was thanked for their time and continued support of the above study.   Jordyn Pennstrom, BS   [facility] Clinical Research Specialist II Direct Dial: [phone]  Fax: [phone]

## 2023-10-06 ENCOUNTER — Other Ambulatory Visit (HOSPITAL_COMMUNITY)
Admission: RE | Admit: 2023-10-06 | Discharge: 2023-10-06 | Disposition: A | Discharge status: Home / Self Care | Payer: Self-pay | Source: Ambulatory Visit | Attending: Oncology | Admitting: Oncology

## 2023-10-06 DIAGNOSIS — Z006 Encounter for examination for normal comparison and control in clinical research program: Secondary | ICD-10-CM | POA: Insufficient documentation

## 2023-10-16 LAB — GENECONNECT MOLECULAR SCREEN: Genetic Analysis Overall Interpretation: NEGATIVE

## 2023-10-19 ENCOUNTER — Other Ambulatory Visit (HOSPITAL_COMMUNITY): Payer: Self-pay | Admitting: Internal Medicine

## 2023-10-22 ENCOUNTER — Other Ambulatory Visit (HOSPITAL_COMMUNITY): Payer: Self-pay | Admitting: Internal Medicine

## 2023-11-04 ENCOUNTER — Other Ambulatory Visit (HOSPITAL_COMMUNITY): Payer: Self-pay | Admitting: Internal Medicine

## 2023-11-15 ENCOUNTER — Ambulatory Visit (HOSPITAL_COMMUNITY)
Admission: RE | Admit: 2023-11-15 | Discharge: 2023-11-15 | Disposition: A | Discharge status: Home / Self Care | Source: Ambulatory Visit | Attending: Internal Medicine | Admitting: Internal Medicine

## 2023-11-15 DIAGNOSIS — I340 Nonrheumatic mitral (valve) insufficiency: Secondary | ICD-10-CM | POA: Insufficient documentation

## 2023-11-15 DIAGNOSIS — I5022 Chronic systolic (congestive) heart failure: Secondary | ICD-10-CM | POA: Insufficient documentation

## 2023-11-15 DIAGNOSIS — E119 Type 2 diabetes mellitus without complications: Secondary | ICD-10-CM | POA: Insufficient documentation

## 2023-11-15 DIAGNOSIS — I110 Hypertensive heart disease with heart failure: Secondary | ICD-10-CM | POA: Insufficient documentation

## 2023-11-15 LAB — ECHOCARDIOGRAM COMPLETE
AR max vel: 2.71 cm2
AV Area VTI: 2.38 cm2
AV Area mean vel: 2.61 cm2
AV Mean grad: 4 mmHg
AV Peak grad: 6.3 mmHg
Ao pk vel: 1.25 m/s
Area-P 1/2: 3.97 cm2
S' Lateral: 4.6 cm

## 2023-11-19 ENCOUNTER — Other Ambulatory Visit (HOSPITAL_COMMUNITY): Payer: Self-pay | Admitting: Internal Medicine

## 2023-12-20 ENCOUNTER — Other Ambulatory Visit (HOSPITAL_COMMUNITY): Payer: Self-pay | Admitting: Internal Medicine

## 2023-12-28 ENCOUNTER — Other Ambulatory Visit: Payer: Self-pay | Admitting: Internal Medicine

## 2024-01-05 ENCOUNTER — Other Ambulatory Visit: Payer: Self-pay | Admitting: Family Medicine

## 2024-01-05 DIAGNOSIS — E119 Type 2 diabetes mellitus without complications: Secondary | ICD-10-CM

## 2024-01-05 MED ORDER — METFORMIN HCL ER 500 MG PO TB24: 500.0000 mg | ORAL_TABLET | Freq: Every day | ORAL | 1 refills | Status: AC

## 2024-01-05 NOTE — Telephone Encounter (Signed)
 FYI Only or Action Required?: Action required by provider: medication refill request.  Patient was last seen in primary care on 06/22/2023 by Kennyth Worth HERO, MD.  Called Nurse Triage reporting No chief complaint on file..  Symptoms began today.  Interventions attempted: Nothing.  Symptoms are: stable.  Triage Disposition: No disposition on file.  Patient/caregiver understands and will follow disposition?:

## 2024-01-05 NOTE — Telephone Encounter (Signed)
 Copied from CRM [id number]. Topic: Clinical - Medication Refill >> Jan 05, 2024  9:47 AM Jasmin G wrote: Medication: metFORMIN  (GLUCOPHAGE -XR) 500 MG 24 hr tablet  Has the patient contacted their pharmacy? Yes (Agent: If no, request that the patient contact the pharmacy for the refill. If patient does not wish to contact the pharmacy document the reason why and proceed with request.) (Agent: If yes, when and what did the pharmacy advise?)  This is the patient's preferred pharmacy:  [id number] - [place], [place] - [address] AT [facility] [address] [place] [address] Phone: [phone] Fax: [phone]  Is this the correct pharmacy for this prescription? Yes If no, delete pharmacy and type the correct one.   Has the prescription been filled recently? Yes  Is the patient out of the medication? No  Has the patient been seen for an appointment in the last year OR does the patient have an upcoming appointment? Yes  Can we respond through MyChart? No  Agent: Please be advised that Rx refills may take up to 3 business days. We ask that you follow-up with your pharmacy.

## 2024-01-18 ENCOUNTER — Other Ambulatory Visit (HOSPITAL_COMMUNITY): Payer: Self-pay | Admitting: Internal Medicine

## 2024-01-20 DIAGNOSIS — Z01419 Encounter for gynecological examination (general) (routine) without abnormal findings: Secondary | ICD-10-CM | POA: Diagnosis not present

## 2024-01-20 DIAGNOSIS — Z1389 Encounter for screening for other disorder: Secondary | ICD-10-CM | POA: Diagnosis not present

## 2024-01-20 DIAGNOSIS — Z124 Encounter for screening for malignant neoplasm of cervix: Secondary | ICD-10-CM | POA: Diagnosis not present

## 2024-01-20 DIAGNOSIS — Z1231 Encounter for screening mammogram for malignant neoplasm of breast: Secondary | ICD-10-CM | POA: Diagnosis not present

## 2024-01-20 DIAGNOSIS — Z113 Encounter for screening for infections with a predominantly sexual mode of transmission: Secondary | ICD-10-CM | POA: Diagnosis not present

## 2024-02-15 ENCOUNTER — Other Ambulatory Visit (HOSPITAL_COMMUNITY): Payer: Self-pay | Admitting: Internal Medicine

## 2024-03-09 ENCOUNTER — Encounter: Payer: Self-pay | Admitting: Cardiology

## 2024-03-09 ENCOUNTER — Other Ambulatory Visit: Payer: Self-pay | Admitting: Internal Medicine

## 2024-04-07 ENCOUNTER — Other Ambulatory Visit: Payer: Self-pay | Admitting: Internal Medicine

## 2024-04-12 DIAGNOSIS — L700 Acne vulgaris: Secondary | ICD-10-CM | POA: Diagnosis not present

## 2024-04-29 NOTE — Progress Notes (Unsigned)
" °  Electrophysiology Office Follow up Visit Note:    Date:  05/01/2024   ID:  Savannah Morales, DOB May 21, 1965, MRN [id number]  PCP:  Kennyth Worth HERO, MD  [facility] Cardiologist:  Darryle ONEIDA Decent, MD  [facility] Electrophysiologist:  OLE ONEIDA HOLTS, MD    Interval History:     Savannah Morales is a 59 y.o. female who presents for a follow up visit.   The patient last saw Savannah Morales 11/30/2022. She has a history of chronic systolic heart failure, PVC and NICM. She takes mexiletine for PVC suppression. Ablation has not been recommended due to low burden and multiple morphologies.  She presents for follow-up.  She is doing well.  She has been taking the mexiletine without off target effects.       Past medical, surgical, social and family history were reviewed.  ROS:   Please see the history of present illness.    All other systems reviewed and are negative.  EKGs/Labs/Other Studies Reviewed:    The following studies were reviewed today:     EKG Interpretation Date/Time:  Monday May 01 2024 11:09:17 EST Ventricular Rate:  65 PR Interval:  182 QRS Duration:  88 QT Interval:  392 QTC Calculation: 407 R Axis:   46  Text Interpretation: Sinus rhythm with occasional Premature ventricular complexes Confirmed by Holts Ole [phone]) on 05/01/2024 11:10:22 AM    Physical Exam:    VS:  BP 94/60 (BP Location: Left Arm, Patient Position: Sitting, Cuff Size: Normal)   Pulse 65   Ht 5' 8 (1.727 m)   Wt 155 lb (70.3 kg)   BMI 23.57 kg/m     Wt Readings from Last 3 Encounters:  05/01/24 155 lb (70.3 kg)  09/23/23 156 lb (70.8 kg)  06/22/23 154 lb 3.2 oz (69.9 kg)     GEN: no distress CARD: RRR, No MRG RESP: No IWOB. CTAB.      ASSESSMENT:    1. Chronic systolic congestive heart failure (HCC)   2. PVC's (premature ventricular contractions)    PLAN:    In order of problems listed above:  #Frequent PVC's #High risk medication monitoring -  mexiletine Doing well. Continue mexiletine 200mg  PO BID  #Chronic systolic heart failure NYHA II.  Warm and dry on exam today. Last EF 45% in July 2025.  I discussed my upcoming departure from [facility] during today's clinic appointment.  The patient will continue to follow-up with one of my EP partners moving forward.  Follow up 6 months with EP APP.    Signed, Ole Holts, MD, [facility], [facility] 05/01/2024 11:14 AM    Electrophysiology [facility] "

## 2024-05-01 ENCOUNTER — Encounter: Payer: Self-pay | Admitting: Cardiology

## 2024-05-01 ENCOUNTER — Ambulatory Visit: Attending: Cardiology | Admitting: Cardiology

## 2024-05-01 VITALS — BP 94/60 | HR 65 | Ht 68.0 in | Wt 155.0 lb

## 2024-05-01 DIAGNOSIS — I493 Ventricular premature depolarization: Secondary | ICD-10-CM

## 2024-05-01 DIAGNOSIS — I5022 Chronic systolic (congestive) heart failure: Secondary | ICD-10-CM

## 2024-05-01 NOTE — Patient Instructions (Signed)

## 2024-05-12 ENCOUNTER — Other Ambulatory Visit (HOSPITAL_COMMUNITY): Payer: Self-pay | Admitting: Internal Medicine

## 2024-06-19 ENCOUNTER — Encounter: Admitting: Family Medicine
